# Patient Record
Sex: Male | Born: 1945 | Race: Black or African American | Hispanic: No | Marital: Married | Smoking: Former smoker
Health system: Southern US, Community
[De-identification: ages and names within clinical notes are randomized; demographics above are authoritative.]

## PROBLEM LIST (undated history)

## (undated) DIAGNOSIS — I10 Essential (primary) hypertension: Secondary | ICD-10-CM

## (undated) DIAGNOSIS — K219 Gastro-esophageal reflux disease without esophagitis: Secondary | ICD-10-CM

## (undated) DIAGNOSIS — C801 Malignant (primary) neoplasm, unspecified: Secondary | ICD-10-CM

## (undated) HISTORY — PX: BLADDER SURGERY: SHX569

---

## 2015-06-12 DIAGNOSIS — Z131 Encounter for screening for diabetes mellitus: Secondary | ICD-10-CM | POA: Diagnosis not present

## 2015-06-12 DIAGNOSIS — Z125 Encounter for screening for malignant neoplasm of prostate: Secondary | ICD-10-CM | POA: Diagnosis not present

## 2015-06-12 DIAGNOSIS — Z0000 Encounter for general adult medical examination without abnormal findings: Secondary | ICD-10-CM | POA: Diagnosis not present

## 2015-06-12 DIAGNOSIS — Z13220 Encounter for screening for lipoid disorders: Secondary | ICD-10-CM | POA: Diagnosis not present

## 2015-06-12 DIAGNOSIS — K219 Gastro-esophageal reflux disease without esophagitis: Secondary | ICD-10-CM | POA: Diagnosis not present

## 2015-06-12 DIAGNOSIS — I10 Essential (primary) hypertension: Secondary | ICD-10-CM | POA: Diagnosis not present

## 2015-06-13 DIAGNOSIS — H2513 Age-related nuclear cataract, bilateral: Secondary | ICD-10-CM | POA: Diagnosis not present

## 2015-06-13 DIAGNOSIS — H40033 Anatomical narrow angle, bilateral: Secondary | ICD-10-CM | POA: Diagnosis not present

## 2015-07-11 DIAGNOSIS — H6123 Impacted cerumen, bilateral: Secondary | ICD-10-CM | POA: Diagnosis not present

## 2015-07-11 DIAGNOSIS — H903 Sensorineural hearing loss, bilateral: Secondary | ICD-10-CM | POA: Diagnosis not present

## 2015-07-24 DIAGNOSIS — I10 Essential (primary) hypertension: Secondary | ICD-10-CM | POA: Diagnosis not present

## 2015-07-24 DIAGNOSIS — K219 Gastro-esophageal reflux disease without esophagitis: Secondary | ICD-10-CM | POA: Diagnosis not present

## 2015-10-23 DIAGNOSIS — Z2821 Immunization not carried out because of patient refusal: Secondary | ICD-10-CM | POA: Diagnosis not present

## 2015-10-23 DIAGNOSIS — I10 Essential (primary) hypertension: Secondary | ICD-10-CM | POA: Diagnosis not present

## 2017-02-05 ENCOUNTER — Emergency Department (HOSPITAL_COMMUNITY)
Admission: EM | Admit: 2017-02-05 | Discharge: 2017-02-06 | Disposition: A | Discharge status: Home / Self Care | Payer: Medicare Other | Attending: Emergency Medicine | Admitting: Emergency Medicine

## 2017-02-05 ENCOUNTER — Encounter (HOSPITAL_COMMUNITY): Payer: Self-pay | Admitting: Emergency Medicine

## 2017-02-05 DIAGNOSIS — N3001 Acute cystitis with hematuria: Secondary | ICD-10-CM | POA: Diagnosis not present

## 2017-02-05 DIAGNOSIS — R319 Hematuria, unspecified: Secondary | ICD-10-CM | POA: Diagnosis present

## 2017-02-05 DIAGNOSIS — N329 Bladder disorder, unspecified: Secondary | ICD-10-CM

## 2017-02-05 DIAGNOSIS — I10 Essential (primary) hypertension: Secondary | ICD-10-CM | POA: Diagnosis not present

## 2017-02-05 DIAGNOSIS — Z87891 Personal history of nicotine dependence: Secondary | ICD-10-CM | POA: Diagnosis not present

## 2017-02-05 HISTORY — DX: Gastro-esophageal reflux disease without esophagitis: K21.9

## 2017-02-05 HISTORY — DX: Essential (primary) hypertension: I10

## 2017-02-05 LAB — URINALYSIS, ROUTINE W REFLEX MICROSCOPIC
Bilirubin Urine: NEGATIVE
Glucose, UA: NEGATIVE mg/dL
Ketones, ur: NEGATIVE mg/dL
Nitrite: NEGATIVE
Protein, ur: NEGATIVE mg/dL
Specific Gravity, Urine: 1.004 — ABNORMAL LOW (ref 1.005–1.030)
Squamous Epithelial / LPF: NONE SEEN
pH: 6 (ref 5.0–8.0)

## 2017-02-05 NOTE — ED Triage Notes (Signed)
Pt comes in with complaints of hematuria since last week.  Denies any pain with urination.  "slight ache" around right flank.  Ambulatory.  A&O x4.  No other complaints at this time.

## 2017-02-05 NOTE — ED Notes (Signed)
MADE TWO UNSUCCESSFUL ATTEMPTS TO COLLECT BLOOD SAMPLES 

## 2017-02-05 NOTE — ED Provider Notes (Signed)
[facility] DEPT Provider Note   CSN: [id number] Arrival date & time: 02/05/17  1910   By signing my name below, I, Delton Prairie, attest that this documentation has been prepared under the direction and in the presence of Catalyna Reilly, MD  Electronically Signed: Delton Prairie, ED Scribe. 02/05/17. 12:00 AM.   History   Chief Complaint Chief Complaint  Patient presents with  . Hematuria  . Flank Pain   The history is provided by the patient. No language interpreter was used.  Hematuria  This is a new problem. The current episode started more than 2 days ago. The problem occurs every several days. The problem has not changed since onset.Pertinent negatives include no abdominal pain and no headaches. Nothing aggravates the symptoms. Nothing relieves the symptoms. He has tried nothing for the symptoms. The treatment provided no relief.   HPI Comments:  Jeremy Patrick is a 71 y.o. male, with a hx of GERD and HTN on 40 mg lisinopril, who presents to the Emergency Department complaining of acute onset, persistent hematuria onset 1 week. He also reports frequency, constipation and resolved right sided lower back pain. No alleviating factors noted. Pt denies fevers, dysuria, nausea, vomiting or any other associated symptoms.   Past Medical History:  Diagnosis Date  . GERD (gastroesophageal reflux disease)   . Hypertension     There are no active problems to display for this patient.   History reviewed. No pertinent surgical history.   Home Medications    Prior to Admission medications   Not on File    Family History No family history on file.  Social History Social History  Substance Use Topics  . Smoking status: Former Research scientist (life sciences)  . Smokeless tobacco: Never Used  . Alcohol use Yes     Allergies   Patient has no known allergies.   Review of Systems Review of Systems  Constitutional: Negative for fever.  Gastrointestinal: Positive for constipation. Negative for  abdominal pain, nausea and vomiting.  Genitourinary: Positive for frequency and hematuria. Negative for dysuria and flank pain.  Musculoskeletal: Positive for myalgias.  Neurological: Negative for headaches.  All other systems reviewed and are negative.  Physical Exam Updated Vital Signs BP (!) 151/114 (BP Location: Left Arm)   Pulse 64   Temp 98.4 F (36.9 C) (Oral)   Resp 18   Ht 6' (1.829 m)   Wt 210 lb (95.3 kg)   SpO2 99%   BMI 28.48 kg/m   Physical Exam  Constitutional: He is oriented to person, place, and time. He appears well-developed and well-nourished. No distress.  HENT:  Head: Normocephalic and atraumatic.  Mouth/Throat: Oropharynx is clear and moist. No oropharyngeal exudate.  Moist mucous membranes   Eyes: Conjunctivae are normal. Pupils are equal, round, and reactive to light.  Neck: Normal range of motion. Neck supple. No JVD present.  Trachea midline No bruit  Cardiovascular: Normal rate, regular rhythm and normal heart sounds.   Pulmonary/Chest: Effort normal and breath sounds normal. No stridor. No respiratory distress.  Abdominal: Soft. Bowel sounds are normal. He exhibits no distension and no mass. There is no tenderness. There is no rebound and no guarding.  Musculoskeletal: Normal range of motion.  Neurological: He is alert and oriented to person, place, and time. He has normal reflexes. He displays normal reflexes.  Skin: Skin is warm and dry. Capillary refill takes less than 2 seconds.  Psychiatric: He has a normal mood and affect. His behavior is normal.  Nursing note and  vitals reviewed.  ED Treatments / Results   Vitals:   02/05/17 1941 02/05/17 2159  BP: 168/86 (!) 151/114  Pulse: 64 64  Resp: 18 18  Temp: 98.4 F (36.9 C)    Results for orders placed or performed during the hospital encounter of 02/05/17  Urinalysis, Routine w reflex microscopic- may I&O cath if menses  Result Value Ref Range   Color, Urine STRAW (A) YELLOW    APPearance CLEAR CLEAR   Specific Gravity, Urine 1.004 (L) 1.005 - 1.030   pH 6.0 5.0 - 8.0   Glucose, UA NEGATIVE NEGATIVE mg/dL   Hgb urine dipstick LARGE (A) NEGATIVE   Bilirubin Urine NEGATIVE NEGATIVE   Ketones, ur NEGATIVE NEGATIVE mg/dL   Protein, ur NEGATIVE NEGATIVE mg/dL   Nitrite NEGATIVE NEGATIVE   Leukocytes, UA MODERATE (A) NEGATIVE   RBC / HPF TOO NUMEROUS TO COUNT 0 - 5 RBC/hpf   WBC, UA TOO NUMEROUS TO COUNT 0 - 5 WBC/hpf   Bacteria, UA RARE (A) NONE SEEN   Squamous Epithelial / LPF NONE SEEN NONE SEEN  CBC  Result Value Ref Range   WBC 7.5 4.0 - 10.5 K/uL   RBC 4.78 4.22 - 5.81 MIL/uL   Hemoglobin 13.6 13.0 - 17.0 g/dL   HCT 40.4 39.0 - 52.0 %   MCV 84.5 78.0 - 100.0 fL   MCH 28.5 26.0 - 34.0 pg   MCHC 33.7 30.0 - 36.0 g/dL   RDW 14.2 11.5 - 15.5 %   Platelets 225 150 - 400 K/uL  Basic metabolic panel  Result Value Ref Range   Sodium 143 135 - 145 mmol/L   Potassium 3.5 3.5 - 5.1 mmol/L   Chloride 109 101 - 111 mmol/L   CO2 26 22 - 32 mmol/L   Glucose, Bld 96 65 - 99 mg/dL   BUN 12 6 - 20 mg/dL   Creatinine, Ser 1.32 (H) 0.61 - 1.24 mg/dL   Calcium 9.3 8.9 - 10.3 mg/dL   GFR calc non Af Amer 53 (L) >60 mL/min   GFR calc Af Amer >60 >60 mL/min   Anion gap 8 5 - 15   Ct Renal Stone Study  Result Date: 02/06/2017 CLINICAL DATA:  Acute onset of hematuria pain and right flank ache. Initial encounter. EXAM: CT ABDOMEN AND PELVIS WITHOUT CONTRAST TECHNIQUE: Multidetector CT imaging of the abdomen and pelvis was performed following the standard protocol without IV contrast. COMPARISON:  None. FINDINGS: Lower chest: The visualized lung bases are grossly clear. The visualized portions of the mediastinum are unremarkable. Hepatobiliary: Hypodensities within the liver measure up to 1.5 cm in size. The liver is otherwise unremarkable. Stones are seen within the gallbladder. The common bile duct remains normal in caliber. Pancreas: The pancreas is within normal limits.  Spleen: The spleen is unremarkable in appearance. Adrenals/Urinary Tract: The adrenal glands are unremarkable in appearance. The kidneys are within normal limits. There is no evidence of hydronephrosis. No renal or ureteral stones are identified. No perinephric stranding is seen. Stomach/Bowel: The stomach is unremarkable in appearance. The small bowel is within normal limits. The appendix is normal in caliber, without evidence of appendicitis. Scattered diverticulosis is noted along the ascending and descending colon, without evidence of diverticulitis. Vascular/Lymphatic: Minimal calcification is noted about the aortic bifurcation. The aorta is otherwise unremarkable. The inferior vena cava is grossly unremarkable. No retroperitoneal lymphadenopathy is seen. No pelvic sidewall lymphadenopathy is identified. Reproductive: A focal 2.5 cm nodule is noted at the left base  of the bladder. This is suspicious for malignancy, given hematuria, though fungal infection might have a similar appearance. Mild haziness about the bladder may reflect cystitis. The prostate remains normal in size, with minimal calcification. Other: No additional soft tissue abnormalities are seen. Musculoskeletal: No acute osseous abnormalities are identified. The visualized musculature is unremarkable in appearance. IMPRESSION: 1. Focal 2.5 cm nodule at the left base of the bladder. This is suspicious for malignancy, given the patient's hematuria, though fungal infection might have a similar appearance. Cystoscopy is recommended for further evaluation. 2. Mild haziness about the the bladder may reflect cystitis. 3. Nonspecific hypodensities within the liver measure up to 1.5 cm in size. Would correlate with LFTs. 4. Cholelithiasis.  Gallbladder otherwise unremarkable. 5. Scattered diverticulosis along the ascending and descending colon, without evidence of diverticulitis. Electronically Signed   By: Garald Balding M.D.   On: 02/06/2017 00:31     DIAGNOSTIC STUDIES: Oxygen Saturation is 99% on RA, normal by my interpretation.    COORDINATION OF CARE: 11:59 PM Discussed treatment plan with pt at bedside and pt agreed to plan.   Procedures Procedures (including critical care time)  Medications Ordered in ED  Medications  tamsulosin (FLOMAX) capsule 0.4 mg (0.4 mg Oral Given 02/06/17 0042)  cefTRIAXone (ROCEPHIN) injection 1 g (1 g Intramuscular Given 02/06/17 0042)  lidocaine (XYLOCAINE) 1 % (with pres) injection (2.1 mLs  Given 02/06/17 0042)      This is a 71 y.o. -year-old male presents with hematuria.  The patient is nontoxic-appearing on exam and vital signs are within normal limits. He will be treated for acute cystitis for both baterial and fungal infection as this was also questioned on CT scan.  He has a lesion in his bladder and will be referred to Dr. Raynelle Bring, urology for cystoscopy to determine the etiology of this lesion.  Patient and wife informed that while there is infection this lesion could potentionally represent malignancy so it is imperative that they follow up ASAP.  Both verbalize understanding and agree to follow up.    Patient was given return precautions and was advised to return immediately for fevers, weakness, vomiting or if current symptoms worsen. After history, exam, and medical workup I feel the patient has been appropriately medically screened and is safe for discharge home. Pertinent diagnoses were discussed with the patient.   I personally performed the services described in this documentation, which was scribed in my presence. The recorded information has been reviewed and is accurate.       Veatrice Kells, MD 02/06/17 [phone]

## 2017-02-06 ENCOUNTER — Encounter (HOSPITAL_COMMUNITY): Payer: Self-pay | Admitting: Emergency Medicine

## 2017-02-06 ENCOUNTER — Emergency Department (HOSPITAL_COMMUNITY): Payer: Medicare Other

## 2017-02-06 DIAGNOSIS — N3001 Acute cystitis with hematuria: Secondary | ICD-10-CM | POA: Diagnosis not present

## 2017-02-06 LAB — CBC
HCT: 40.4 % (ref 39.0–52.0)
Hemoglobin: 13.6 g/dL (ref 13.0–17.0)
MCH: 28.5 pg (ref 26.0–34.0)
MCHC: 33.7 g/dL (ref 30.0–36.0)
MCV: 84.5 fL (ref 78.0–100.0)
Platelets: 225 10*3/uL (ref 150–400)
RBC: 4.78 MIL/uL (ref 4.22–5.81)
RDW: 14.2 % (ref 11.5–15.5)
WBC: 7.5 10*3/uL (ref 4.0–10.5)

## 2017-02-06 LAB — BASIC METABOLIC PANEL
Anion gap: 8 (ref 5–15)
BUN: 12 mg/dL (ref 6–20)
CO2: 26 mmol/L (ref 22–32)
Calcium: 9.3 mg/dL (ref 8.9–10.3)
Chloride: 109 mmol/L (ref 101–111)
Creatinine, Ser: 1.32 mg/dL — ABNORMAL HIGH (ref 0.61–1.24)
GFR calc Af Amer: >60 mL/min (ref >60)
GFR calc non Af Amer: 53 mL/min — ABNORMAL LOW (ref >60)
Glucose, Bld: 96 mg/dL (ref 65–99)
Potassium: 3.5 mmol/L (ref 3.5–5.1)
Sodium: 143 mmol/L (ref 135–145)

## 2017-02-06 MED ORDER — TAMSULOSIN HCL 0.4 MG PO CAPS: 0.4000 mg | ORAL_CAPSULE | Freq: Every day | ORAL | 0 refills | Status: DC

## 2017-02-06 MED ORDER — FLUCONAZOLE 200 MG PO TABS: 200.0000 mg | ORAL_TABLET | Freq: Every day | ORAL | 0 refills | Status: DC

## 2017-02-06 MED ORDER — LIDOCAINE HCL 1 % IJ SOLN
INTRAMUSCULAR | Status: AC
  Administered 2017-02-06: 2.1 mL
  Filled 2017-02-06: qty 20

## 2017-02-06 MED ORDER — CEFTRIAXONE SODIUM 1 G IJ SOLR
1.0000 g | Freq: Once | INTRAMUSCULAR | Status: AC
  Administered 2017-02-06: 1 g via INTRAMUSCULAR
  Filled 2017-02-06: qty 10

## 2017-02-06 MED ORDER — TAMSULOSIN HCL 0.4 MG PO CAPS
0.4000 mg | ORAL_CAPSULE | Freq: Every day | ORAL | Status: DC
  Administered 2017-02-06: 0.4 mg via ORAL
  Filled 2017-02-06: qty 1

## 2017-02-06 MED ORDER — CEPHALEXIN 500 MG PO CAPS: 500.0000 mg | ORAL_CAPSULE | Freq: Four times a day (QID) | ORAL | 0 refills | Status: DC

## 2017-02-07 LAB — URINE CULTURE: Culture: <10000 — AB

## 2017-02-13 ENCOUNTER — Other Ambulatory Visit: Payer: Self-pay | Admitting: Urology

## 2017-02-26 NOTE — Patient Instructions (Addendum)
BLAKELEY MARGRAF  02/26/2017   Your procedure is scheduled on: 03-02-17  Report to [facility] Main  Entrance take [facility]  elevators to 3rd floor to  [place] at 3:30PM.  Call this number if you have problems the morning of surgery [phone]   Remember: ONLY 1 PERSON MAY GO WITH YOU TO SHORT STAY TO GET  READY MORNING OF [place].  Do not eat food After Midnight. You may have clear liquids from midnight until 930am day of surgery. Nothing by mouth after 930am!!     Take these medicines the morning of surgery with A SIP OF WATER: diltiazem(cardizem), ranitidine(zantac)                                 You may not have any metal on your body including hair pins and              piercings  Do not wear jewelry, make-up, lotions, powders or perfumes, deodorant             Do not wear nail polish.  Do not shave  48 hours prior to surgery.              Men may shave face and neck.   Do not bring valuables to the hospital. [place].  Contacts, dentures or bridgework may not be worn into surgery.       Patients discharged the day of surgery will not be allowed to drive home.  Name and phone number of your driver:  Special Instructions: N/A              Please read over the following fact sheets you were given: _____________________________________________________________________      CLEAR LIQUID DIET   Foods Allowed                                                                     Foods Excluded  Coffee and tea, regular and decaf                             liquids that you cannot  Plain Jell-O in any flavor                                             see through such as: Fruit ices (not with fruit pulp)                                     milk, soups, orange juice  Iced Popsicles                                    All solid food Carbonated  beverages, regular and diet                                     Cranberry, grape and apple juices Sports drinks like Gatorade Lightly seasoned clear broth or consume(fat free) Sugar, honey syrup  Sample Menu Breakfast                                Lunch                                     Supper Cranberry juice                    Beef broth                            Chicken broth Jell-O                                     Grape juice                           Apple juice Coffee or tea                        Jell-O                                      Popsicle                                                Coffee or tea                        Coffee or tea  _____________________________________________________________________            [facility] - Preparing for Surgery Before surgery, you can play an important role.  Because skin is not sterile, your skin needs to be as free of germs as possible.  You can reduce the number of germs on your skin by washing with CHG (chlorahexidine gluconate) soap before surgery.  CHG is an antiseptic cleaner which kills germs and bonds with the skin to continue killing germs even after washing. Please DO NOT use if you have an allergy to CHG or antibacterial soaps.  If your skin becomes reddened/irritated stop using the CHG and inform your nurse when you arrive at Short Stay. Do not shave (including legs and underarms) for at least 48 hours prior to the first CHG shower.  You may shave your face/neck. Please follow these instructions carefully:  1.  Shower with CHG Soap the night before surgery and the  morning of Surgery.  2.  If you choose to wash your hair, wash your hair first as usual with your  normal  shampoo.  3.  After you shampoo, rinse your hair and body thoroughly to remove the  shampoo.  4.  Use CHG as you would any other liquid soap.  You can apply chg directly  to the skin and wash                       Gently with a scrungie or clean washcloth.  5.  Apply the CHG Soap  to your body ONLY FROM THE NECK DOWN.   Do not use on face/ open                           Wound or open sores. Avoid contact with eyes, ears mouth and genitals (private parts).                       Wash face,  Genitals (private parts) with your normal soap.             6.  Wash thoroughly, paying special attention to the area where your surgery  will be performed.  7.  Thoroughly rinse your body with warm water from the neck down.  8.  DO NOT shower/wash with your normal soap after using and rinsing off  the CHG Soap.                9.  Pat yourself dry with a clean towel.            10.  Wear clean pajamas.            11.  Place clean sheets on your bed the night of your first shower and do not  sleep with pets. Day of Surgery : Do not apply any lotions/deodorants the morning of surgery.  Please wear clean clothes to the hospital/surgery center.  FAILURE TO FOLLOW THESE INSTRUCTIONS MAY RESULT IN THE CANCELLATION OF YOUR SURGERY PATIENT SIGNATURE_________________________________  NURSE SIGNATURE__________________________________  ________________________________________________________________________

## 2017-02-26 NOTE — Progress Notes (Signed)
CBC, BMP 02-06-17 epic

## 2017-02-27 ENCOUNTER — Ambulatory Visit (HOSPITAL_COMMUNITY)
Admission: RE | Admit: 2017-02-27 | Discharge: 2017-02-27 | Disposition: A | Discharge status: Home / Self Care | Payer: Medicare Other | Source: Ambulatory Visit | Attending: Urology | Admitting: Urology

## 2017-02-27 ENCOUNTER — Encounter (HOSPITAL_COMMUNITY)
Admission: RE | Admit: 2017-02-27 | Discharge: 2017-02-27 | Disposition: A | Discharge status: Home / Self Care | Payer: Medicare Other | Source: Ambulatory Visit | Attending: Urology | Admitting: Urology

## 2017-02-27 ENCOUNTER — Encounter (HOSPITAL_COMMUNITY): Payer: Self-pay

## 2017-02-27 DIAGNOSIS — R001 Bradycardia, unspecified: Secondary | ICD-10-CM | POA: Diagnosis not present

## 2017-02-27 DIAGNOSIS — I10 Essential (primary) hypertension: Secondary | ICD-10-CM | POA: Insufficient documentation

## 2017-02-27 DIAGNOSIS — R938 Abnormal findings on diagnostic imaging of other specified body structures: Secondary | ICD-10-CM | POA: Insufficient documentation

## 2017-02-27 DIAGNOSIS — Z01810 Encounter for preprocedural cardiovascular examination: Secondary | ICD-10-CM | POA: Diagnosis not present

## 2017-02-27 DIAGNOSIS — R9389 Abnormal findings on diagnostic imaging of other specified body structures: Secondary | ICD-10-CM

## 2017-02-27 NOTE — Progress Notes (Signed)
CXR results routed to Dr Alinda Money via epic

## 2017-02-27 NOTE — H&P (Signed)
Office Visit Report     02/11/2017   --------------------------------------------------------------------------------   Jeremy Patrick  MRN: [id number]  PRIMARY CARE:  Nolene Ebbs, MD  DOB: 11-Aug-1946, 71 year old Male  REFERRING:    SSN: [id number]  PROVIDER:  Raynelle Bring, M.D.    LOCATION:  [facility] [phone]   --------------------------------------------------------------------------------   CC/HPI: Gross hematuria/bladder mass   Jeremy Patrick is a 71 year old gentleman who was recently evaluated in the emergency department on 02/06/17 after developing painless gross hematuria. He does have a history distant tobacco use and smoked 1 pack per week for approximately 10 years prior to quitting about 40 years ago. He has no family history of GU malignancy. In the emergency department, he underwent a CT stone study without contrast that did demonstrate a possible bladder tumor. No hydronephrosis or urolithiasis was noted.     ALLERGIES: None   MEDICATIONS: Aspirin  Lisinopril  Omeprazole     GU PSH: None   NON-GU PSH: None   GU PMH: None   NON-GU PMH: GERD    FAMILY HISTORY: 3 Son's - Runs in Family    Notes: 2 daughters   SOCIAL HISTORY: Marital Status: Unknown Current Smoking Status: Patient has never smoked.   Tobacco Use Assessment Completed: Used Tobacco in last 30 days? Does not drink anymore.  Drinks 1 caffeinated drink per day.    REVIEW OF SYSTEMS:    GU Review Male:   Patient reports frequent urination and get up at night to urinate. Patient denies hard to postpone urination, burning/ pain with urination, leakage of urine, stream starts and stops, trouble starting your streams, and have to strain to urinate .  Gastrointestinal (Lower):   Patient denies diarrhea and constipation.  Gastrointestinal (Upper):   Patient denies nausea and vomiting.  Constitutional:   Patient denies fever, night sweats, weight loss, and fatigue.  Skin:    Patient denies itching and skin rash/ lesion.  Eyes:   Patient denies blurred vision and double vision.  Ears/ Nose/ Throat:   Patient denies sore throat and sinus problems.  Hematologic/Lymphatic:   Patient denies swollen glands and easy bruising.  Cardiovascular:   Patient denies leg swelling and chest pains.  Respiratory:   Patient denies cough and shortness of breath.  Endocrine:   Patient denies excessive thirst.  Musculoskeletal:   Patient denies back pain and joint pain.  Neurological:   Patient denies headaches and dizziness.  Psychologic:   Patient denies depression and anxiety.   VITAL SIGNS:      02/11/2017 01:04 PM  Weight 210 lb / 95.25 kg  Height 72 in / 182.88 cm  BP 161/96 mmHg  Pulse 59 /min  BMI 28.5 kg/m   MULTI-SYSTEM PHYSICAL EXAMINATION:    Constitutional: Well-nourished. No physical deformities. Normally developed. Good grooming.  Neck: Neck symmetrical, not swollen. Normal tracheal position.  Respiratory: No labored breathing, no use of accessory muscles. Clear bilaterally.  Cardiovascular: Normal temperature, normal extremity pulses, no swelling, no varicosities. Regular rate and rhythm.  Lymphatic: No enlargement of neck, axillae, groin.  Skin: No paleness, no jaundice, no cyanosis. No lesion, no ulcer, no rash.  Neurologic / Psychiatric: Oriented to time, oriented to place, oriented to person. No depression, no anxiety, no agitation.  Gastrointestinal: No mass, no tenderness, no rigidity, non obese abdomen.  Eyes: Normal conjunctivae. Normal eyelids.  Ears, Nose, Mouth, and Throat: Left ear no scars, no lesions, no masses. Right ear no scars, no lesions,  no masses. Nose no scars, no lesions, no masses. Normal hearing. Normal lips.  Musculoskeletal: Normal gait and station of head and neck.     PAST DATA REVIEWED:  Source Of History:  Patient  Records Review:   Previous Patient Records  Urine Test Review:   Urinalysis  X-Ray Review: C.T. Stone Protocol:  Reviewed Films. Findings are as dictated above.    PROCEDURES:         Flexible Cystoscopy - 52000  Indication: Hematuria/bladder mass Risks, benefits, and potential complications of the procedure were discussed with the patient including infection, bleeding, voiding discomfort, urinary retention, fever, chills, sepsis, and others. All questions were answered. Informed consent was obtained. Sterile technique and intraurethral analgesia were used.  Meatus:  Normal size. Normal location. Normal condition.  Urethra:  No strictures.  External Sphincter:  Normal.  Verumontanum:  Normal.  Prostate:  Non-obstructing. No hyperplasia.  Bladder Neck:  Non-obstructing.  Ureteral Orifices:  Normal location. Normal size. Normal shape. Effluxed clear urine.  Bladder:  Systematic examination of the bladder was performed. This revealed a 3 cm bladder tumor off the left side of the bladder neck protruding across the bladder neck. This did appear somewhat concerning for high-grade disease. There were no other bladder tumors or other mucosal pathology noted. A bladder washing for cytology was obtained.      Chaperone: AJ The procedure was well-tolerated and without complications. Instructions were given to call the office immediately if questions or problems.         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 Hematuria With and Without I.V. Contrast  Return Visit/Planned Activity: Other See Visit Notes             Note: Will call to schedule surgery          Document Letter(s):  Created for Patient: Clinical Summary         Notes:   1. Gross hematuria: He will complete his hematuria evaluation with a CT hematuria protocol scan. He does have an obvious bladder tumor that is the most likely etiology for his hematuria.   2. Bladder tumor: We reviewed the fact that this likely represents a bladder malignancy. I recommended that he be scheduled for cystoscopy and transurethral resection of his bladder tumor with pelvic exam under anesthesia. We reviewed the potential risks of this procedure including but not limited to bleeding, infection, cardiopulmonary risks of anesthesia, need for further procedures, and the risk of bladder perforation, etc. I recommended that he have postoperative installation of mitomycin C and we discussed the  potential risks and benefits of postoperative chemotherapy as well. He gives his informed consent to proceed.   Cc:    * Signed by Raynelle Bring, M.D. on 02/11/17 at 5:57 PM (EST)*

## 2017-03-02 ENCOUNTER — Encounter (HOSPITAL_COMMUNITY): Payer: Self-pay | Admitting: *Deleted

## 2017-03-02 ENCOUNTER — Ambulatory Visit (HOSPITAL_COMMUNITY): Payer: Medicare Other | Admitting: Anesthesiology

## 2017-03-02 ENCOUNTER — Encounter (HOSPITAL_COMMUNITY)
Admission: RE | Disposition: A | Discharge status: Home / Self Care | Payer: Self-pay | Source: Ambulatory Visit | Attending: Urology

## 2017-03-02 ENCOUNTER — Ambulatory Visit (HOSPITAL_COMMUNITY)
Admission: RE | Admit: 2017-03-02 | Discharge: 2017-03-02 | Disposition: A | Discharge status: Home / Self Care | Payer: Medicare Other | Source: Ambulatory Visit | Attending: Urology | Admitting: Urology

## 2017-03-02 DIAGNOSIS — R310 Gross hematuria: Secondary | ICD-10-CM | POA: Diagnosis not present

## 2017-03-02 DIAGNOSIS — I10 Essential (primary) hypertension: Secondary | ICD-10-CM | POA: Diagnosis not present

## 2017-03-02 DIAGNOSIS — K219 Gastro-esophageal reflux disease without esophagitis: Secondary | ICD-10-CM | POA: Insufficient documentation

## 2017-03-02 DIAGNOSIS — C679 Malignant neoplasm of bladder, unspecified: Secondary | ICD-10-CM | POA: Insufficient documentation

## 2017-03-02 DIAGNOSIS — Z7982 Long term (current) use of aspirin: Secondary | ICD-10-CM | POA: Diagnosis not present

## 2017-03-02 DIAGNOSIS — D494 Neoplasm of unspecified behavior of bladder: Secondary | ICD-10-CM | POA: Diagnosis present

## 2017-03-02 HISTORY — PX: TRANSURETHRAL RESECTION OF BLADDER TUMOR WITH MITOMYCIN-C: SHX6459

## 2017-03-02 LAB — BASIC METABOLIC PANEL
Anion gap: 8 (ref 5–15)
BUN: 13 mg/dL (ref 6–20)
CO2: 28 mmol/L (ref 22–32)
Calcium: 9.2 mg/dL (ref 8.9–10.3)
Chloride: 102 mmol/L (ref 101–111)
Creatinine, Ser: 1.39 mg/dL — ABNORMAL HIGH (ref 0.61–1.24)
GFR calc Af Amer: 58 mL/min — ABNORMAL LOW (ref >60)
GFR calc non Af Amer: 50 mL/min — ABNORMAL LOW (ref >60)
Glucose, Bld: 86 mg/dL (ref 65–99)
Potassium: 3.7 mmol/L (ref 3.5–5.1)
Sodium: 138 mmol/L (ref 135–145)

## 2017-03-02 SURGERY — TRANSURETHRAL RESECTION OF BLADDER TUMOR WITH MITOMYCIN-C
Anesthesia: General

## 2017-03-02 MED ORDER — LACTATED RINGERS IV SOLN
INTRAVENOUS | Status: DC
  Administered 2017-03-02: 1000 mL via INTRAVENOUS

## 2017-03-02 MED ORDER — CEFAZOLIN SODIUM-DEXTROSE 2-4 GM/100ML-% IV SOLN
2.0000 g | INTRAVENOUS | Status: AC
  Administered 2017-03-02: 2 g via INTRAVENOUS
  Filled 2017-03-02: qty 100

## 2017-03-02 MED ORDER — PROMETHAZINE HCL 25 MG/ML IJ SOLN: 6.2500 mg | INTRAMUSCULAR | Status: DC | PRN

## 2017-03-02 MED ORDER — ONDANSETRON HCL 4 MG/2ML IJ SOLN
INTRAMUSCULAR | Status: DC | PRN
  Administered 2017-03-02: 4 mg via INTRAVENOUS

## 2017-03-02 MED ORDER — HYDROCODONE-ACETAMINOPHEN 5-325 MG PO TABS
1.0000 | ORAL_TABLET | Freq: Once | ORAL | Status: AC | PRN
  Administered 2017-03-02: 1 via ORAL
  Filled 2017-03-02: qty 1

## 2017-03-02 MED ORDER — PHENAZOPYRIDINE HCL 100 MG PO TABS
100.0000 mg | ORAL_TABLET | Freq: Once | ORAL | Status: DC | PRN
  Filled 2017-03-02: qty 1

## 2017-03-02 MED ORDER — SUGAMMADEX SODIUM 200 MG/2ML IV SOLN
INTRAVENOUS | Status: DC | PRN
Start: 2017-03-02 — End: 2017-03-02
  Administered 2017-03-02: 200 mg via INTRAVENOUS

## 2017-03-02 MED ORDER — LIDOCAINE 2% (20 MG/ML) 5 ML SYRINGE
INTRAMUSCULAR | Status: AC
  Filled 2017-03-02: qty 5

## 2017-03-02 MED ORDER — ROCURONIUM BROMIDE 10 MG/ML (PF) SYRINGE
PREFILLED_SYRINGE | INTRAVENOUS | Status: DC | PRN
  Administered 2017-03-02: 40 mg via INTRAVENOUS

## 2017-03-02 MED ORDER — FENTANYL CITRATE (PF) 100 MCG/2ML IJ SOLN
INTRAMUSCULAR | Status: AC
  Filled 2017-03-02: qty 2

## 2017-03-02 MED ORDER — PHENAZOPYRIDINE HCL 100 MG PO TABS: 100.0000 mg | ORAL_TABLET | Freq: Three times a day (TID) | ORAL | 0 refills | Status: DC | PRN

## 2017-03-02 MED ORDER — SODIUM CHLORIDE 0.9 % IR SOLN
Status: DC | PRN
  Administered 2017-03-02: 12000 mL via INTRAVESICAL

## 2017-03-02 MED ORDER — MEPERIDINE HCL 50 MG/ML IJ SOLN: 6.2500 mg | INTRAMUSCULAR | Status: DC | PRN

## 2017-03-02 MED ORDER — HYDROCODONE-ACETAMINOPHEN 5-325 MG PO TABS: 1.0000 | ORAL_TABLET | Freq: Four times a day (QID) | ORAL | 0 refills | Status: DC | PRN

## 2017-03-02 MED ORDER — HYDROMORPHONE HCL 1 MG/ML IJ SOLN
0.2500 mg | INTRAMUSCULAR | Status: DC | PRN
Start: 2017-03-02 — End: 2017-03-02

## 2017-03-02 MED ORDER — PROPOFOL 10 MG/ML IV BOLUS
INTRAVENOUS | Status: DC | PRN
  Administered 2017-03-02: 200 mg via INTRAVENOUS

## 2017-03-02 MED ORDER — FENTANYL CITRATE (PF) 100 MCG/2ML IJ SOLN
INTRAMUSCULAR | Status: DC | PRN
  Administered 2017-03-02 (×2): 50 ug via INTRAVENOUS
  Administered 2017-03-02: 100 ug via INTRAVENOUS

## 2017-03-02 MED ORDER — LIDOCAINE 2% (20 MG/ML) 5 ML SYRINGE
INTRAMUSCULAR | Status: DC | PRN
  Administered 2017-03-02: 100 mg via INTRAVENOUS

## 2017-03-02 MED ORDER — SUCCINYLCHOLINE CHLORIDE 200 MG/10ML IV SOSY
PREFILLED_SYRINGE | INTRAVENOUS | Status: DC | PRN
  Administered 2017-03-02: 160 mg via INTRAVENOUS

## 2017-03-02 MED ORDER — ONDANSETRON HCL 4 MG/2ML IJ SOLN
INTRAMUSCULAR | Status: AC
  Filled 2017-03-02: qty 2

## 2017-03-02 MED ORDER — SUGAMMADEX SODIUM 200 MG/2ML IV SOLN
INTRAVENOUS | Status: AC
  Filled 2017-03-02: qty 2

## 2017-03-02 MED ORDER — ROCURONIUM BROMIDE 50 MG/5ML IV SOSY
PREFILLED_SYRINGE | INTRAVENOUS | Status: AC
  Filled 2017-03-02: qty 5

## 2017-03-02 SURGICAL SUPPLY — 11 items
BAG URO CATCHER STRL LF (MISCELLANEOUS) ×3 IMPLANT
CATH INTERMIT  6FR 70CM (CATHETERS) ×3 IMPLANT
CLOTH BEACON ORANGE TIMEOUT ST (SAFETY) ×3 IMPLANT
GLOVE BIOGEL M STRL SZ7.5 (GLOVE) ×3 IMPLANT
GOWN STRL REUS W/TWL LRG LVL3 (GOWN DISPOSABLE) ×6 IMPLANT
GUIDEWIRE STR DUAL SENSOR (WIRE) ×3 IMPLANT
LOOP CUT BIPOLAR 24F LRG (ELECTROSURGICAL) ×3 IMPLANT
MANIFOLD NEPTUNE II (INSTRUMENTS) ×3 IMPLANT
NS IRRIG 1000ML POUR BTL (IV SOLUTION) ×3 IMPLANT
PACK CYSTO (CUSTOM PROCEDURE TRAY) ×3 IMPLANT
TUBING CONNECTING 10 (TUBING) ×3 IMPLANT

## 2017-03-02 NOTE — Anesthesia Procedure Notes (Addendum)
Procedure Name: Intubation Date/Time: 03/02/2017 4:29 PM Performed by: Josephine Igo Patient Re-evaluated:Patient Re-evaluated prior to inductionOxygen Delivery Method: Circle system utilized Preoxygenation: Pre-oxygenation with 100% oxygen Intubation Type: IV induction Ventilation: Mask ventilation without difficulty and Oral airway inserted - appropriate to patient size Laryngoscope Size: Mac and 3 Grade View: Grade I Tube type: Oral Tube size: 8.0 mm Number of attempts: 2 Airway Equipment and Method: Stylet Placement Confirmation: ETT inserted through vocal cords under direct vision,  positive ETCO2 and breath sounds checked- equal and bilateral Secured at: 22 cm Tube secured with: Tape Dental Injury: Teeth and Oropharynx as per pre-operative assessment

## 2017-03-02 NOTE — Interval H&P Note (Signed)
History and Physical Interval Note:  03/02/2017 3:26 PM  Jeremy Patrick  has presented today for surgery, with the diagnosis of BLADDER TUMOR  The various methods of treatment have been discussed with the patient and family. After consideration of risks, benefits and other options for treatment, the patient has consented to  Procedure(s) with comments: TRANSURETHRAL RESECTION OF BLADDER TUMOR/ EXAM UNDER ANESTHESIA/ WITH MITOMYCIN-C POST OPERATIVE (N/A) - GENERAL ANESTHESIA WITH PARALYSIS/  NEEDS 1 HOUR TOTAL CYSTOSCOPY WITH RETROGRADE PYELOGRAM (Bilateral) - GENERAL ANESTHESIA WITH PARALYSIS as a surgical intervention .  The patient's history has been reviewed, patient examined, no change in status, stable for surgery.  I have reviewed the patient's chart and labs.  Questions were answered to the patient's satisfaction.     Carmelite Violet,LES

## 2017-03-02 NOTE — Discharge Instructions (Signed)
1. You may see some blood in the urine and may have some burning with urination for 48-72 hours. You also may notice that you have to urinate more frequently or urgently after your procedure which is normal.  °2. You should call should you develop an inability urinate, fever > 101, persistent nausea and vomiting that prevents you from eating or drinking to stay hydrated.  °

## 2017-03-02 NOTE — Op Note (Signed)
Preoperative diagnosis: 1. 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 Cystoscopy 2. Transurethral resection of bladder tumor (3.5 cm) 3. Pelvic exam under anesthesia  Surgeon: Pryor Curia. M.D.  Anesthesia: General  Complications: None  Intraoperative findings:  1. Bladder tumor: A large 3.5 cm papillary tumor was seen extending from just inside the left bladder neck along the sidewall of the bladder.   EBL: Minimal  Specimens: 1. Left bladder neck tumor  Disposition of specimens: Pathology  Indication: Jeremy Patrick is a patient who was found to have a bladder tumor after presenting with gross hematuria.  His upper tract evaluation was negative.  After reviewing the management options for treatment, he elected to proceed with the above surgical procedure(s). We have discussed the potential benefits and risks of the procedure, side effects of the proposed treatment, the likelihood of the patient achieving the goals of the procedure, and any potential problems that might occur during the procedure or recuperation. Informed consent has been obtained.  Description of procedure:  The patient was taken to the operating room and general anesthesia was induced.  The patient was placed in the dorsal lithotomy position, prepped and draped in the usual sterile fashion, and preoperative antibiotics were administered. A preoperative time-out was performed.   Cystourethroscopy was performed.  The patient's urethra was examined and was normal.   The bladder was then systematically examined in its entirety. 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  There was a large 3.5 cm papillary tumor off the left lateral bladder wall just inside the bladder neck.  No other tumors or abnormalities were noted.  The bladder was then re-examined after the resectoscope was  placed.  The bladder tumor was 3.5 cm.  It was located laterally on the left just inside the bladder neck and appeared papillary. Using bipolar loop cautery resection, the entire tumor was resected and removed for permanent pathologic analysis.  Care was taken to resect down to detrusor muscle until all gross tumor was resected.  Hemostasis was then achieved with the loop cautery and the bladder was emptied and reinspected with no further bleeding noted at the end of the procedure.    Considering the lack of pathologic information and the suspicion that this represented an invasive and/or high grade tumor, it was decided not to give postoperative intravesical chemotherapy.  The bladder was then emptied and the procedure ended.  The patient appeared to tolerate the procedure well and without complications.  The patient was able to be awakened and transferred to the recovery unit in satisfactory condition.    Pryor Curia MD

## 2017-03-02 NOTE — Anesthesia Preprocedure Evaluation (Signed)
Anesthesia Evaluation  Patient identified by MRN, date of birth, ID band Patient awake    Reviewed: Allergy & Precautions, NPO status , Patient's Chart, lab work & pertinent test results  Airway Mallampati: II  TM Distance: >3 FB Neck ROM: Full    Dental  (+) Poor Dentition, Missing, Chipped   Pulmonary former smoker,    Pulmonary exam normal breath sounds clear to auscultation       Cardiovascular hypertension, Pt. on medications Normal cardiovascular exam Rhythm:Regular Rate:Normal     Neuro/Psych negative neurological ROS  negative psych ROS   GI/Hepatic Neg liver ROS, GERD  Medicated and Controlled,  Endo/Other  negative endocrine ROS  Renal/GU negative Renal ROS  negative genitourinary   Musculoskeletal negative musculoskeletal ROS (+)   Abdominal   Peds  Hematology negative hematology ROS (+)   Anesthesia Other Findings   Reproductive/Obstetrics                             Anesthesia Physical Anesthesia Plan  ASA: II  Anesthesia Plan: General   Post-op Pain Management:    Induction: Intravenous and Cricoid pressure planned  Airway Management Planned: Oral ETT  Additional Equipment:   Intra-op Plan:   Post-operative Plan: Extubation in OR  Informed Consent: I have reviewed the patients History and Physical, chart, labs and discussed the procedure including the risks, benefits and alternatives for the proposed anesthesia with the patient or authorized representative who has indicated his/her understanding and acceptance.   Dental advisory given  Plan Discussed with: CRNA, Anesthesiologist and Surgeon  Anesthesia Plan Comments:         Anesthesia Quick Evaluation

## 2017-03-02 NOTE — Anesthesia Postprocedure Evaluation (Signed)
Anesthesia Post Note  Patient: DREYDON CARDENAS  Procedure(s) Performed: Procedure(s) (LRB): TRANSURETHRAL RESECTION OF BLADDER TUMOR/ EXAM UNDER ANESTHESIA/ WITH MITOMYCIN-C POST OPERATIVE (N/A)  Patient location during evaluation: PACU Anesthesia Type: General Level of consciousness: awake and alert and oriented Pain management: pain level controlled Vital Signs Assessment: post-procedure vital signs reviewed and stable Respiratory status: spontaneous breathing, nonlabored ventilation and respiratory function stable Cardiovascular status: blood pressure returned to baseline and stable Postop Assessment: no signs of nausea or vomiting Anesthetic complications: no       Last Vitals:  Vitals:   03/02/17 1745 03/02/17 1800  BP: (!) 153/88 (!) 174/87  Pulse: 72 68  Resp: 16 17  Temp:  36.8 C    Last Pain:  Vitals:   03/02/17 1800  TempSrc:   PainSc: 0-No pain                 Mariaceleste Herrera A.

## 2017-03-02 NOTE — Transfer of Care (Signed)
Immediate Anesthesia Transfer of Care Note  Patient: Jeremy Patrick  Procedure(s) Performed: Procedure(s) with comments: TRANSURETHRAL RESECTION OF BLADDER TUMOR/ EXAM UNDER ANESTHESIA/ WITH MITOMYCIN-C POST OPERATIVE (N/A) - GENERAL ANESTHESIA WITH PARALYSIS/  NEEDS 1 HOUR TOTAL  Patient Location: PACU  Anesthesia Type:General  Level of Consciousness:  sedated, patient cooperative and responds to stimulation  Airway & Oxygen Therapy:Patient Spontanous Breathing and Patient connected to face mask oxgen  Post-op Assessment:  Report given to PACU RN and Post -op Vital signs reviewed and stable  Post vital signs:  Reviewed and stable  Last Vitals:  Vitals:   03/02/17 1350 03/02/17 1735  BP: (!) 168/82   Pulse:    Resp:    Temp:  (P) 16.8 C    Complications: No apparent anesthesia complications

## 2017-03-03 ENCOUNTER — Encounter: Payer: Self-pay | Admitting: Family

## 2017-03-03 ENCOUNTER — Ambulatory Visit (INDEPENDENT_AMBULATORY_CARE_PROVIDER_SITE_OTHER): Payer: Medicare Other | Admitting: Family

## 2017-03-03 VITALS — BP 136/80 | HR 65 | Temp 98.5°F | Resp 16 | Ht 72.0 in | Wt 213.0 lb

## 2017-03-03 DIAGNOSIS — I10 Essential (primary) hypertension: Secondary | ICD-10-CM | POA: Diagnosis not present

## 2017-03-03 DIAGNOSIS — K219 Gastro-esophageal reflux disease without esophagitis: Secondary | ICD-10-CM | POA: Diagnosis not present

## 2017-03-03 MED ORDER — DILTIAZEM HCL ER COATED BEADS 360 MG PO CP24: 360.0000 mg | ORAL_CAPSULE | Freq: Every day | ORAL | 1 refills | Status: DC

## 2017-03-03 MED ORDER — LISINOPRIL 40 MG PO TABS: 40.0000 mg | ORAL_TABLET | Freq: Every day | ORAL | 1 refills | Status: DC

## 2017-03-03 NOTE — Assessment & Plan Note (Signed)
Gastroesophageal reflux appears adequately controlled current medication regimen and no adverse side effects. No abdominal pain with a normal exam. Continue current dosage of Zantac.

## 2017-03-03 NOTE — Progress Notes (Signed)
Subjective:    Patient ID: Jeremy Patrick, male    DOB: 1946/09/20, 71 y.o.   MRN: [id number]  Chief Complaint  Patient presents with  . Establish Care    HPI:  Jeremy Patrick is a 71 y.o. male who  has a past medical history of GERD (gastroesophageal reflux disease) and Hypertension. and presents today for an office visit to establish care.  1.) Hypertension - Currently maintained on lisinopril and diltiazem. Reports taking the medication with missed doses on occasion and denies adverse side effects or hypotensive readings. Does not check blood pressure at home. Denies changes in vision, worst headache of life or new symptoms of end organ damage. Working on following a low sodium diet. No structured exercise.    BP Readings from Last 3 Encounters:  03/03/17 136/80  03/02/17 (!) 152/109  02/27/17 (!) 154/77   2. GERD - Currently maintained on Zantac. Reports taking the medications as prescribed and denies adverse side effects. Symptoms are generally well controlled with the current medication regimen.    No Known Allergies    Outpatient Medications Prior to Visit  Medication Sig Dispense Refill  . HYDROcodone-acetaminophen (NORCO/VICODIN) 5-325 MG tablet Take 1-2 tablets by mouth every 6 (six) hours as needed. 10 tablet 0  . phenazopyridine (PYRIDIUM) 100 MG tablet Take 1 tablet (100 mg total) by mouth 3 (three) times daily as needed for pain (for burning). 20 tablet 0  . ranitidine (ZANTAC) 300 MG tablet Take 300 mg by mouth daily.    Marland Kitchen diltiazem (CARDIZEM CD) 360 MG 24 hr capsule Take 360 mg by mouth daily.    Marland Kitchen lisinopril (PRINIVIL,ZESTRIL) 40 MG tablet Take 40 mg by mouth daily.     No facility-administered medications prior to visit.      Past Medical History:  Diagnosis Date  . GERD (gastroesophageal reflux disease)   . Hypertension       Past Surgical History:  Procedure Laterality Date  . BLADDER SURGERY        Family History  Problem Relation Age of  Onset  . Hypertension Mother   . Stroke Mother   . Diabetes Mother   . Cataracts Mother   . Kidney disease Mother   . Hypertension Father   . Stroke Father   . Aneurysm Maternal Grandmother       Social History   Social History  . Marital status: Single    Spouse name: N/A  . Number of children: 4  . Years of education: 10   Occupational History  . Retired    Social History Main Topics  . Smoking status: Former Smoker    Packs/day: 0.15    Years: 10.00    Types: Cigarettes  . Smokeless tobacco: Never Used  . Alcohol use Yes     Comment: occasionally  . Drug use: No     Comment: "years ago"  . Sexual activity: Not on file   Other Topics Concern  . Not on file   Social History Narrative   Fun/Hobby: Go to [employer] and travel.       Review of Systems  Constitutional: Negative for chills and fever.  Eyes:       Negative for changes in vision  Respiratory: Negative for cough, chest tightness and wheezing.   Cardiovascular: Negative for chest pain, palpitations and leg swelling.  Gastrointestinal: Negative for abdominal pain, anal bleeding, blood in stool, constipation, diarrhea, nausea, rectal pain and vomiting.  Neurological: Negative for dizziness, weakness  and light-headedness.       Objective:    BP 136/80 (BP Location: Right Arm, Patient Position: Sitting, Cuff Size: Large)   Pulse 65   Temp 98.5 F (36.9 C) (Oral)   Resp 16   Ht 6' (1.829 m)   Wt 213 lb (96.6 kg)   SpO2 97%   BMI 28.89 kg/m  Nursing note and vital signs reviewed.  Physical Exam  Constitutional: He is oriented to person, place, and time. He appears well-developed and well-nourished. No distress.  Cardiovascular: Normal rate, regular rhythm, normal heart sounds and intact distal pulses.   Pulmonary/Chest: Effort normal and breath sounds normal.  Neurological: He is alert and oriented to person, place, and time.  Skin: Skin is warm and dry.  Psychiatric: He has a normal mood and  affect. His behavior is normal. Judgment and thought content normal.        Assessment & Plan:   Problem List Items Addressed This Visit      Cardiovascular and Mediastinum   Essential hypertension - Primary    Blood pressure below goal 140/90 with current medication regimen and no adverse side effects despite less than full compliance with patient. Denies worst headache of life with no symptoms of end organ damage noted on physical exam. Encouraged to take medications as prescribed prevent end organ damage in the future. Continue current dosage of lisinopril and diltiazem. Encouraged to monitor blood pressure at home and follow sodium diet. Information on Dash eating plan provided and after visit summary. Continue to monitor.      Relevant Medications   diltiazem (CARDIZEM CD) 360 MG 24 hr capsule   lisinopril (PRINIVIL,ZESTRIL) 40 MG tablet     Digestive   Gastroesophageal reflux disease without esophagitis    Gastroesophageal reflux appears adequately controlled current medication regimen and no adverse side effects. No abdominal pain with a normal exam. Continue current dosage of Zantac.          I have changed Mr. Nuccio's diltiazem and lisinopril. I am also having him maintain his ranitidine, phenazopyridine, and HYDROcodone-acetaminophen.   Meds ordered this encounter  Medications  . diltiazem (CARDIZEM CD) 360 MG 24 hr capsule    Sig: Take 1 capsule (360 mg total) by mouth daily.    Dispense:  90 capsule    Refill:  1  . lisinopril (PRINIVIL,ZESTRIL) 40 MG tablet    Sig: Take 1 tablet (40 mg total) by mouth daily.    Dispense:  90 tablet    Refill:  1     Follow-up: Return in about 3 months (around 06/03/2017), or if symptoms worsen or fail to improve.  Mauricio Po, FNP

## 2017-03-03 NOTE — Assessment & Plan Note (Signed)
Blood pressure below goal 140/90 with current medication regimen and no adverse side effects despite less than full compliance with patient. Denies worst headache of life with no symptoms of end organ damage noted on physical exam. Encouraged to take medications as prescribed prevent end organ damage in the future. Continue current dosage of lisinopril and diltiazem. Encouraged to monitor blood pressure at home and follow sodium diet. Information on Dash eating plan provided and after visit summary. Continue to monitor.

## 2017-03-03 NOTE — Patient Instructions (Signed)
Thank you for choosing [facility].  SUMMARY AND INSTRUCTIONS:  Please continue to take your medications as prescribed.  Monitor your blood pressure at home daily at different times throughout the day. Record and bring numbers to office visits.  Schedule a time for your physical at your convenience.   Follow up with Dr. Alinda Money as scheduled.   Medication:  Your prescription(s) have been submitted to your pharmacy or been printed and provided for you. Please take as directed and contact our office if you believe you are having problem(s) with the medication(s) or have any questions.   Follow up:  If your symptoms worsen or fail to improve, please contact our office for further instruction, or in case of emergency go directly to the emergency room at the closest medical facility.     Food Choices for Gastroesophageal Reflux Disease, Adult When you have gastroesophageal reflux disease (GERD), the foods you eat and your eating habits are very important. Choosing the right foods can help ease your discomfort. What guidelines do I need to follow?  Choose fruits, vegetables, whole grains, and low-fat dairy products.  Choose low-fat meat, fish, and poultry.  Limit fats such as oils, salad dressings, butter, nuts, and avocado.  Keep a food diary. This helps you identify foods that cause symptoms.  Avoid foods that cause symptoms. These may be different for everyone.  Eat small meals often instead of 3 large meals a day.  Eat your meals slowly, in a place where you are relaxed.  Limit fried foods.  Cook foods using methods other than frying.  Avoid drinking alcohol.  Avoid drinking large amounts of liquids with your meals.  Avoid bending over or lying down until 2-3 hours after eating. What foods are not recommended? These are some foods and drinks that may make your symptoms worse: Vegetables  Tomatoes. Tomato juice. Tomato and spaghetti sauce. Chili peppers. Onion  and garlic. Horseradish. Fruits  Oranges, grapefruit, and lemon (fruit and juice). Meats  High-fat meats, fish, and poultry. This includes hot dogs, ribs, ham, sausage, salami, and bacon. Dairy  Whole milk and chocolate milk. Sour cream. Cream. Butter. Ice cream. Cream cheese. Drinks  Coffee and tea. Bubbly (carbonated) drinks or energy drinks. Condiments  Hot sauce. Barbecue sauce. Sweets/Desserts  Chocolate and cocoa. Donuts. Peppermint and spearmint. Fats and Oils  High-fat foods. This includes [place] fries and potato chips. Other  Vinegar. Strong spices. This includes black pepper, white pepper, red pepper, cayenne, curry powder, cloves, ginger, and chili powder. The items listed above may not be a complete list of foods and drinks to avoid. Contact your dietitian for more information.  This information is not intended to replace advice given to you by your health care provider. Make sure you discuss any questions you have with your health care provider. Document Released: 05/25/2012 Document Revised: 05/01/2016 Document Reviewed: 09/28/2013 Elsevier Interactive Patient Education  2017 [place] DASH stands for "Dietary Approaches to Stop Hypertension." The DASH eating plan is a healthy eating plan that has been shown to reduce high blood pressure (hypertension). It may also reduce your risk for type 2 diabetes, heart disease, and stroke. The DASH eating plan may also help with weight loss. What are tips for following this plan? General guidelines   Avoid eating more than 2,300 mg (milligrams) of salt (sodium) a day. If you have hypertension, you may need to reduce your sodium intake to 1,500 mg a day.  Limit alcohol intake  to no more than 1 drink a day for nonpregnant women and 2 drinks a day for men. One drink equals 12 oz of beer, 5 oz of wine, or 1 oz of hard liquor.  Work with your health care provider to maintain a healthy body weight or to lose  weight. Ask what an ideal weight is for you.  Get at least 30 minutes of exercise that causes your heart to beat faster (aerobic exercise) most days of the week. Activities may include walking, swimming, or biking.  Work with your health care provider or diet and nutrition specialist (dietitian) to adjust your eating plan to your individual calorie needs. Reading food labels   Check food labels for the amount of sodium per serving. Choose foods with less than 5 percent of the Daily Value of sodium. Generally, foods with less than 300 mg of sodium per serving fit into this eating plan.  To find whole grains, look for the word "whole" as the first word in the ingredient list. Shopping   Buy products labeled as "low-sodium" or "no salt added."  Buy fresh foods. Avoid canned foods and premade or frozen meals. Cooking   Avoid adding salt when cooking. Use salt-free seasonings or herbs instead of table salt or sea salt. Check with your health care provider or pharmacist before using salt substitutes.  Do not fry foods. Cook foods using healthy methods such as baking, boiling, grilling, and broiling instead.  Cook with heart-healthy oils, such as olive, canola, soybean, or sunflower oil. Meal planning    Eat a balanced diet that includes:  5 or more servings of fruits and vegetables each day. At each meal, try to fill half of your plate with fruits and vegetables.  Up to 6-8 servings of whole grains each day.  Less than 6 oz of lean meat, poultry, or fish each day. A 3-oz serving of meat is about the same size as a deck of cards. One egg equals 1 oz.  2 servings of low-fat dairy each day.  A serving of nuts, seeds, or beans 5 times each week.  Heart-healthy fats. Healthy fats called Omega-3 fatty acids are found in foods such as flaxseeds and coldwater fish, like sardines, salmon, and mackerel.  Limit how much you eat of the following:  Canned or prepackaged foods.  Food that is  high in trans fat, such as fried foods.  Food that is high in saturated fat, such as fatty meat.  Sweets, desserts, sugary drinks, and other foods with added sugar.  Full-fat dairy products.  Do not salt foods before eating.  Try to eat at least 2 vegetarian meals each week.  Eat more home-cooked food and less restaurant, buffet, and fast food.  When eating at a restaurant, ask that your food be prepared with less salt or no salt, if possible. What foods are recommended? The items listed may not be a complete list. Talk with your dietitian about what dietary choices are best for you. Grains  Whole-grain or whole-wheat bread. Whole-grain or whole-wheat pasta. Brown rice. [place]. Bulgur. Whole-grain and low-sodium cereals. Pita bread. Low-fat, low-sodium crackers. Whole-wheat flour tortillas. Vegetables  Fresh or frozen vegetables (raw, steamed, roasted, or grilled). Low-sodium or reduced-sodium tomato and vegetable juice. Low-sodium or reduced-sodium tomato sauce and tomato paste. Low-sodium or reduced-sodium canned vegetables. Fruits  All fresh, dried, or frozen fruit. Canned fruit in natural juice (without added sugar). Meat and other protein foods  Skinless chicken or [place]. Ground chicken  or [place]. Pork with fat trimmed off. Fish and seafood. Egg whites. Dried beans, peas, or lentils. Unsalted nuts, nut butters, and seeds. Unsalted canned beans. Lean cuts of beef with fat trimmed off. Low-sodium, lean deli meat. Dairy  Low-fat (1%) or fat-free (skim) milk. Fat-free, low-fat, or reduced-fat cheeses. Nonfat, low-sodium ricotta or cottage cheese. Low-fat or nonfat yogurt. Low-fat, low-sodium cheese. Fats and oils  Soft margarine without trans fats. Vegetable oil. Low-fat, reduced-fat, or light mayonnaise and salad dressings (reduced-sodium). Canola, safflower, olive, soybean, and sunflower oils. Avocado. Seasoning and other foods  Herbs. Spices. Seasoning mixes without salt.  Unsalted popcorn and pretzels. Fat-free sweets. What foods are not recommended? The items listed may not be a complete list. Talk with your dietitian about what dietary choices are best for you. Grains  Baked goods made with fat, such as croissants, muffins, or some breads. Dry pasta or rice meal packs. Vegetables  Creamed or fried vegetables. Vegetables in a cheese sauce. Regular canned vegetables (not low-sodium or reduced-sodium). Regular canned tomato sauce and paste (not low-sodium or reduced-sodium). Regular tomato and vegetable juice (not low-sodium or reduced-sodium). Angie Fava. Olives. Fruits  Canned fruit in a light or heavy syrup. Fried fruit. Fruit in cream or butter sauce. Meat and other protein foods  Fatty cuts of meat. Ribs. Fried meat. Berniece Salines. Sausage. Bologna and other processed lunch meats. Salami. Fatback. Hotdogs. Bratwurst. Salted nuts and seeds. Canned beans with added salt. Canned or smoked fish. Whole eggs or egg yolks. Chicken or [place] with skin. Dairy  Whole or 2% milk, cream, and half-and-half. Whole or full-fat cream cheese. Whole-fat or sweetened yogurt. Full-fat cheese. Nondairy creamers. Whipped toppings. Processed cheese and cheese spreads. Fats and oils  Butter. Stick margarine. Lard. Shortening. Ghee. Bacon fat. Tropical oils, such as coconut, palm kernel, or palm oil. Seasoning and other foods  Salted popcorn and pretzels. Onion salt, garlic salt, seasoned salt, table salt, and sea salt. Worcestershire sauce. Tartar sauce. Barbecue sauce. Teriyaki sauce. Soy sauce, including reduced-sodium. Steak sauce. Canned and packaged gravies. Fish sauce. Oyster sauce. Cocktail sauce. Horseradish that you find on the shelf. Ketchup. Mustard. Meat flavorings and tenderizers. Bouillon cubes. Hot sauce and Tabasco sauce. Premade or packaged marinades. Premade or packaged taco seasonings. Relishes. Regular salad dressings. Where to find more information:  National Heart, Lung,  and [place]: https://wilson-eaton.com/  American Heart Association: www.heart.org Summary  The DASH eating plan is a healthy eating plan that has been shown to reduce high blood pressure (hypertension). It may also reduce your risk for type 2 diabetes, heart disease, and stroke.  With the DASH eating plan, you should limit salt (sodium) intake to 2,300 mg a day. If you have hypertension, you may need to reduce your sodium intake to 1,500 mg a day.  When on the DASH eating plan, aim to eat more fresh fruits and vegetables, whole grains, lean proteins, low-fat dairy, and heart-healthy fats.  Work with your health care provider or diet and nutrition specialist (dietitian) to adjust your eating plan to your individual calorie needs. This information is not intended to replace advice given to you by your health care provider. Make sure you discuss any questions you have with your health care provider. Document Released: 11/13/2011 Document Revised: 11/17/2016 Document Reviewed: 11/17/2016 Elsevier Interactive Patient Education  2017 [employer].

## 2017-03-23 ENCOUNTER — Encounter: Payer: Medicare Other | Admitting: Family

## 2017-03-25 ENCOUNTER — Other Ambulatory Visit: Payer: Self-pay | Admitting: Urology

## 2017-03-30 ENCOUNTER — Ambulatory Visit (INDEPENDENT_AMBULATORY_CARE_PROVIDER_SITE_OTHER): Payer: Medicare Other | Admitting: Family

## 2017-03-30 ENCOUNTER — Encounter: Payer: Self-pay | Admitting: Family

## 2017-03-30 ENCOUNTER — Other Ambulatory Visit (INDEPENDENT_AMBULATORY_CARE_PROVIDER_SITE_OTHER): Payer: Medicare Other

## 2017-03-30 VITALS — BP 144/80 | HR 63 | Temp 98.1°F | Resp 16 | Ht 72.0 in | Wt 209.8 lb

## 2017-03-30 DIAGNOSIS — Z7189 Other specified counseling: Secondary | ICD-10-CM | POA: Insufficient documentation

## 2017-03-30 DIAGNOSIS — C679 Malignant neoplasm of bladder, unspecified: Secondary | ICD-10-CM | POA: Diagnosis not present

## 2017-03-30 DIAGNOSIS — Z0000 Encounter for general adult medical examination without abnormal findings: Secondary | ICD-10-CM

## 2017-03-30 DIAGNOSIS — I10 Essential (primary) hypertension: Secondary | ICD-10-CM | POA: Diagnosis not present

## 2017-03-30 DIAGNOSIS — Z7289 Other problems related to lifestyle: Secondary | ICD-10-CM | POA: Diagnosis not present

## 2017-03-30 LAB — COMPREHENSIVE METABOLIC PANEL
ALT: 15 U/L (ref 0–53)
AST: 17 U/L (ref 0–37)
Albumin: 4.3 g/dL (ref 3.5–5.2)
Alkaline Phosphatase: 67 U/L (ref 39–117)
BUN: 15 mg/dL (ref 6–23)
CO2: 31 mEq/L (ref 19–32)
Calcium: 9.9 mg/dL (ref 8.4–10.5)
Chloride: 106 mEq/L (ref 96–112)
Creatinine, Ser: 1.39 mg/dL (ref 0.40–1.50)
GFR: 65.12 mL/min (ref >60.00)
Glucose, Bld: 87 mg/dL (ref 70–99)
Potassium: 4.3 mEq/L (ref 3.5–5.1)
Sodium: 143 mEq/L (ref 135–145)
Total Bilirubin: 0.4 mg/dL (ref 0.2–1.2)
Total Protein: 7.5 g/dL (ref 6.0–8.3)

## 2017-03-30 LAB — PSA: PSA: 1.03 ng/mL (ref 0.10–4.00)

## 2017-03-30 LAB — LIPID PANEL
Cholesterol: 192 mg/dL (ref 0–200)
HDL: 39.1 mg/dL (ref >39.00)
LDL Cholesterol: 131 mg/dL — ABNORMAL HIGH (ref 0–99)
NonHDL: 152.9
Total CHOL/HDL Ratio: 5
Triglycerides: 112 mg/dL (ref 0.0–149.0)
VLDL: 22.4 mg/dL (ref 0.0–40.0)

## 2017-03-30 LAB — CBC
HCT: 39.2 % (ref 39.0–52.0)
Hemoglobin: 12.7 g/dL — ABNORMAL LOW (ref 13.0–17.0)
MCHC: 32.3 g/dL (ref 30.0–36.0)
MCV: 85.5 fl (ref 78.0–100.0)
Platelets: 282 10*3/uL (ref 150.0–400.0)
RBC: 4.59 Mil/uL (ref 4.22–5.81)
RDW: 14.1 % (ref 11.5–15.5)
WBC: 6.7 10*3/uL (ref 4.0–10.5)

## 2017-03-30 NOTE — Patient Instructions (Signed)
Thank you for choosing [facility].  SUMMARY AND INSTRUCTIONS:  Please continue to take your medications as prescribed.  Work on a nutritional intake that is moderate, varied, and balanced.   Gradually increase physical activity to 30 minutes most days of the week.  Labs:  Please stop by the lab on the lower level of the building for your blood work. Your results will be released to [facility] (or called to you) after review, usually within 72 hours after test completion. If any changes need to be made, you will be notified at that same time.  1.) The lab is open from 7:30am to 5:30 pm Monday-Friday 2.) No appointment is necessary 3.) Fasting (if needed) is 6-8 hours after food and drink; black coffee and water are okay   Follow up:  If your symptoms worsen or fail to improve, please contact our office for further instruction, or in case of emergency go directly to the emergency room at the closest medical facility.   Health Maintenance  Topic Date Due  . Hepatitis C Screening  08-27-1946  . TETANUS/TDAP  01/24/1967  . COLONOSCOPY  01/24/1998  . PNA vac Low Risk Adult (1 of 2 - PCV13) 01/24/2013  . INFLUENZA VACCINE  07/08/2017    Health Maintenance, Male A healthy lifestyle and preventive care is important for your health and wellness. Ask your health care provider about what schedule of regular examinations is right for you. What should I know about weight and diet?  Eat a Healthy Diet  Eat plenty of vegetables, fruits, whole grains, low-fat dairy products, and lean protein.  Do not eat a lot of foods high in solid fats, added sugars, or salt. Maintain a Healthy Weight  Regular exercise can help you achieve or maintain a healthy weight. You should:  Do at least 150 minutes of exercise each week. The exercise should increase your heart rate and make you sweat (moderate-intensity exercise).  Do strength-training exercises at least twice a week. Watch Your Levels of  Cholesterol and Blood Lipids  Have your blood tested for lipids and cholesterol every 5 years starting at 70 years of age. If you are at high risk for heart disease, you should start having your blood tested when you are 69 years old. You may need to have your cholesterol levels checked more often if:  Your lipid or cholesterol levels are high.  You are older than 69 years of age.  You are at high risk for heart disease. What should I know about cancer screening? Many types of cancers can be detected early and may often be prevented. Lung Cancer  You should be screened every year for lung cancer if:  You are a current smoker who has smoked for at least 30 years.  You are a former smoker who has quit within the past 15 years.  Talk to your health care provider about your screening options, when you should start screening, and how often you should be screened. Colorectal Cancer  Routine colorectal cancer screening usually begins at 69 years of age and should be repeated every 5-10 years until you are 69 years old. You may need to be screened more often if early forms of precancerous polyps or small growths are found. Your health care provider may recommend screening at an earlier age if you have risk factors for colon cancer.  Your health care provider may recommend using home test kits to check for hidden blood in the stool.  A small camera at  the end of a tube can be used to examine your colon (sigmoidoscopy or colonoscopy). This checks for the earliest forms of colorectal cancer. Prostate and Testicular Cancer  Depending on your age and overall health, your health care provider may do certain tests to screen for prostate and testicular cancer.  Talk to your health care provider about any symptoms or concerns you have about testicular or prostate cancer. Skin Cancer  Check your skin from head to toe regularly.  Tell your health care provider about any new moles or changes in  moles, especially if:  There is a change in a mole's size, shape, or color.  You have a mole that is larger than a pencil eraser.  Always use sunscreen. Apply sunscreen liberally and repeat throughout the day.  Protect yourself by wearing long sleeves, pants, a wide-brimmed hat, and sunglasses when outside. What should I know about heart disease, diabetes, and high blood pressure?  If you are 9-48 years of age, have your blood pressure checked every 3-5 years. If you are 50 years of age or older, have your blood pressure checked every year. You should have your blood pressure measured twice-once when you are at a hospital or clinic, and once when you are not at a hospital or clinic. Record the average of the two measurements. To check your blood pressure when you are not at a hospital or clinic, you can use:  An automated blood pressure machine at a pharmacy.  A home blood pressure monitor.  Talk to your health care provider about your target blood pressure.  If you are between 14-2 years old, ask your health care provider if you should take aspirin to prevent heart disease.  Have regular diabetes screenings by checking your fasting blood sugar level.  If you are at a normal weight and have a low risk for diabetes, have this test once every three years after the age of 61.  If you are overweight and have a high risk for diabetes, consider being tested at a younger age or more often.  A one-time screening for abdominal aortic aneurysm (AAA) by ultrasound is recommended for men aged 59-75 years who are current or former smokers. What should I know about preventing infection? Hepatitis B  If you have a higher risk for hepatitis B, you should be screened for this virus. Talk with your health care provider to find out if you are at risk for hepatitis B infection. Hepatitis C  Blood testing is recommended for:  Everyone born from [birth year] through 1963.  Anyone with known risk factors for  hepatitis C. Sexually Transmitted Diseases (STDs)  You should be screened each year for STDs including gonorrhea and chlamydia if:  You are sexually active and are younger than 69 years of age.  You are older than 69 years of age and your health care provider tells you that you are at risk for this type of infection.  Your sexual activity has changed since you were last screened and you are at an increased risk for chlamydia or gonorrhea. Ask your health care provider if you are at risk.  Talk with your health care provider about whether you are at high risk of being infected with HIV. Your health care provider may recommend a prescription medicine to help prevent HIV infection. What else can I do?  Schedule regular health, dental, and eye exams.  Stay current with your vaccines (immunizations).  Do not use any tobacco products, such as cigarettes, chewing  tobacco, and e-cigarettes. If you need help quitting, ask your health care provider.  Limit alcohol intake to no more than 2 drinks per day. One drink equals 12 ounces of beer, 5 ounces of wine, or 1 ounces of hard liquor.  Do not use street drugs.  Do not share needles.  Ask your health care provider for help if you need support or information about quitting drugs.  Tell your health care provider if you often feel depressed.  Tell your health care provider if you have ever been abused or do not feel safe at home. This information is not intended to replace advice given to you by your health care provider. Make sure you discuss any questions you have with your health care provider. Document Released: 05/22/2008 Document Revised: 07/23/2016 Document Reviewed: 08/28/2015 Elsevier Interactive Patient Education  2017 [place] Directive Advance directives are legal documents that let you make choices ahead of time about your health care and medical treatment in case you become unable to communicate for yourself.  Advance directives are a way for you to communicate your wishes to family, friends, and health care providers. This can help convey your decisions about end-of-life care if you become unable to communicate. Discussing and writing advance directives should happen over time rather than all at once. Advance directives can be changed depending on your situation and what you want, even after you have signed the advance directives. If you do not have an advance directive, some states assign family decision makers to act on your behalf based on how closely you are related to them. Each state has its own laws regarding advance directives. You may want to check with your health care provider, attorney, or state representative about the laws in your state. There are different types of advance directives, such as:  Medical power of attorney.  Living will.  Do not resuscitate (DNR) or do not attempt resuscitation (DNAR) order. Health care proxy and medical power of attorney A health care proxy, also called a health care agent, is a person who is appointed to make medical decisions for you in cases in which you are unable to make the decisions yourself. Generally, people choose someone they know well and trust to represent their preferences. Make sure to ask this person for an agreement to act as your proxy. A proxy may have to exercise judgment in the event of a medical decision for which your wishes are not known. A medical power of attorney is a legal document that names your health care proxy. Depending on the laws in your state, after the document is written, it may also need to be:  Signed.  Notarized.  Dated.  Copied.  Witnessed.  Incorporated into your medical record. You may also want to appoint someone to manage your financial affairs in a situation in which you are unable to do so. This is called a durable power of attorney for finances. It is a separate legal document from the durable power of  attorney for health care. You may choose the same person or someone different from your health care proxy to act as your agent in financial matters. If you do not appoint a proxy, or if there is a concern that the proxy is not acting in your best interests, a court-appointed guardian may be designated to act on your behalf. Living will A living will is a set of instructions documenting your wishes about medical care when you cannot express them yourself.  Health care providers should keep a copy of your living will in your medical record. You may want to give a copy to family members or friends. To alert caregivers in case of an emergency, you can place a card in your wallet to let them know that you have a living will and where they can find it. A living will is used if you become:  Terminally ill.  Incapacitated.  Unable to communicate or make decisions. Items to consider in your living will include:  The use or non-use of life-sustaining equipment, such as dialysis machines and breathing machines (ventilators).  A DNR or DNAR order, which is the instruction not to use cardiopulmonary resuscitation (CPR) if breathing or heartbeat stops.  The use or non-use of tube feeding.  Withholding of food and fluids.  Comfort (palliative) care when the goal becomes comfort rather than a cure.  Organ and tissue donation. A living will does not give instructions for distributing your money and property if you should pass away. It is recommended that you seek the advice of a lawyer when writing a will. Decisions about taxes, beneficiaries, and asset distribution will be legally binding. This process can relieve your family and friends of any concerns surrounding disputes or questions that may come up about the distribution of your assets. DNR or DNAR A DNR or DNAR order is a request not to have CPR in the event that your heart stops beating or you stop breathing. If a DNR or DNAR order has not been made  and shared, a health care provider will try to help any patient whose heart has stopped or who has stopped breathing. If you plan to have surgery, talk with your health care provider about how your DNR or DNAR order will be followed if problems occur. Summary  Advance directives are the legal documents that allow you to make choices ahead of time about your health care and medical treatment in case you become unable to communicate for yourself.  The process of discussing and writing advance directives should happen over time. You can change the advance directives, even after you have signed them.  Advance directives include DNR or DNAR orders, living wills, and designating an agent as your medical power of attorney. This information is not intended to replace advice given to you by your health care provider. Make sure you discuss any questions you have with your health care provider. Document Released: 03/02/2008 Document Revised: 10/13/2016 Document Reviewed: 10/13/2016 Elsevier Interactive Patient Education  2017 [employer].

## 2017-03-30 NOTE — Assessment & Plan Note (Signed)
Reviewed and updated patient's medical, surgical, family and social history. Medications and allergies were also reviewed. Basic screenings for depression, activities of daily living, hearing, cognition and safety were performed. Provider list was updated and health plan was provided to the patient.  

## 2017-03-30 NOTE — Patient Instructions (Addendum)
OMKAR STRATMANN  03/30/2017   Your procedure is scheduled on: 04-06-17  Report to [facility] Main  Entrance    Take [place]  elevators to 3rd floor to  [place] at 930AM.     Call this number if you have problems the morning of surgery [phone]   Remember: ONLY 1 PERSON MAY GO WITH YOU TO SHORT STAY TO GET  READY MORNING OF [place].  Do not eat food or drink liquids :After Midnight.     Take these medicines the morning of surgery with A SIP OF WATER: ranitidine(zantac)                                You may not have any metal on your body including hair pins and              piercings  Do not wear jewelry, make-up, lotions, powders or perfumes, deodorant                         Men may shave face and neck.   Do not bring valuables to the hospital. [place].  Contacts, dentures or bridgework may not be worn into surgery.      Patients discharged the day of surgery will not be allowed to drive home.  Name and phone number of your driver:  Special Instructions: N/A              Please read over the following fact sheets you were given: _____________________________________________________________________             [facility] - Preparing for Surgery Before surgery, you can play an important role.  Because skin is not sterile, your skin needs to be as free of germs as possible.  You can reduce the number of germs on your skin by washing with CHG (chlorahexidine gluconate) soap before surgery.  CHG is an antiseptic cleaner which kills germs and bonds with the skin to continue killing germs even after washing. Please DO NOT use if you have an allergy to CHG or antibacterial soaps.  If your skin becomes reddened/irritated stop using the CHG and inform your nurse when you arrive at Short Stay. Do not shave (including legs and underarms) for at least 48 hours prior to the first CHG shower.  You  may shave your face/neck. Please follow these instructions carefully:  1.  Shower with CHG Soap the night before surgery and the  morning of Surgery.  2.  If you choose to wash your hair, wash your hair first as usual with your  normal  shampoo.  3.  After you shampoo, rinse your hair and body thoroughly to remove the  shampoo.                           4.  Use CHG as you would any other liquid soap.  You can apply chg directly  to the skin and wash                       Gently with a scrungie or clean washcloth.  5.  Apply the CHG Soap  to your body ONLY FROM THE NECK DOWN.   Do not use on face/ open                           Wound or open sores. Avoid contact with eyes, ears mouth and genitals (private parts).                       Wash face,  Genitals (private parts) with your normal soap.             6.  Wash thoroughly, paying special attention to the area where your surgery  will be performed.  7.  Thoroughly rinse your body with warm water from the neck down.  8.  DO NOT shower/wash with your normal soap after using and rinsing off  the CHG Soap.                9.  Pat yourself dry with a clean towel.            10.  Wear clean pajamas.            11.  Place clean sheets on your bed the night of your first shower and do not  sleep with pets. Day of Surgery : Do not apply any lotions/deodorants the morning of surgery.  Please wear clean clothes to the hospital/surgery center.  FAILURE TO FOLLOW THESE INSTRUCTIONS MAY RESULT IN THE CANCELLATION OF YOUR SURGERY PATIENT SIGNATURE_________________________________  NURSE SIGNATURE__________________________________  ________________________________________________________________________

## 2017-03-30 NOTE — Progress Notes (Addendum)
LOV Dr Florene Route 03-30-17 in epic, notes awareness of surgery in assess/plan section EKG 02-27-17 epic CXR 02-27-17 epic

## 2017-03-30 NOTE — Assessment & Plan Note (Signed)
Blood pressure appears adequately controlled current medication regimen and no adverse side effects. Continue current dosage lisinopril.

## 2017-03-30 NOTE — Assessment & Plan Note (Signed)
Noted to have bladder cancer and undergoing current treatment by urology. Scheduled for secondary surgery. Follow-up and additional treatment per urology. Continue to monitor.

## 2017-03-30 NOTE — Assessment & Plan Note (Signed)
1) Anticipatory Guidance: Discussed importance of wearing a seatbelt while driving and not texting while driving; changing batteries in smoke detector at least once annually; wearing suntan lotion when outside; eating a balanced and moderate diet; getting physical activity at least 30 minutes per day.  2) Immunizations / Screenings / Labs:  Declines Tetanus and Prevnar today secondary to up coming surgery. Obtain PSA for prostate cancer screening. Obtain hepatitis C antibody for hepatitis C screening. Indicates he had a colonoscopy approximately 4 years ago. Requested results of colonoscopy and follow-up. Consider referral to gastroenterology for new colonoscopy if necessary. All other screenings are up-to-date per recommendations. Obtain CBC, CMET, and lipid profile.    Overall well exam with risk factors for cardiovascular disease including hypertension. He is also currently undergoing treatment for bladder cancer which is managed by urology. Blood pressure appears adequately controlled current medication regimen and no adverse side effects. Encouraged increasing physical activity to goal of 30 minutes of moderate level activity daily. BMI does indicate overweight. Continue other healthy lifestyle behaviors and choices. Follow-up prevention exam in 1 year. Follow-up office visit pending blood work and for chronic conditions.

## 2017-03-30 NOTE — Progress Notes (Signed)
Subjective:    Patient ID: Jeremy Patrick, male    DOB: [birth year], 71 y.o.   MRN: [id number]  Chief Complaint  Patient presents with  . CPE    not fasting     HPI:  Jeremy Patrick is a 71 y.o. male who presents today for a Medicare Annual Wellness/Physical exam.    1) Health Maintenance -   Diet - Averages about 2-3 meals per day consisting of a regular diet; Caffeine intake of 1 cup on occasion  Exercise - Rarely   2) Preventative Exams / Immunizations:  Dental -- Up to date  Vision -- Up to date   Health Maintenance  Topic Date Due  . Hepatitis C Screening  Apr 19, 1946  . TETANUS/TDAP  01/24/1967  . COLONOSCOPY  01/24/1998  . PNA vac Low Risk Adult (1 of 2 - PCV13) 01/24/2013  . INFLUENZA VACCINE  07/08/2017     There is no immunization history on file for this patient.  RISK FACTORS  Tobacco History  Smoking Status  . Former Smoker  . Packs/day: 0.15  . Years: 10.00  . Types: Cigarettes  Smokeless Tobacco  . Never Used     Cardiac risk factors: advanced age (older than 67 for men, 75 for women), hypertension, male gender and sedentary lifestyle.  Depression Screen  Depression screen [facility] 2/9 03/30/2017  Decreased Interest 0  Down, Depressed, Hopeless 0  PHQ - 2 Score 0     Activities of Daily Living In your present state of health, do you have any difficulty performing the following activities?:  Driving? No Managing money?  No Feeding yourself? No Getting from bed to chair? No Climbing a flight of stairs? No Preparing food and eating?: No Bathing or showering? No Getting dressed: No Getting to the toilet? No Using the toilet: No Moving around from place to place: No In the past year have you fallen or had a near fall?:No   Home Safety Has smoke detector and wears seat belts. No firearms. No excess sun exposure. Are there smokers in your home (other than you)?  No Do you feel safe at home?  Yes  Hearing Difficulties: Yes Do you  often ask people to speak up or repeat themselves? Yes Do you experience ringing or noises in your ears? No  Do you have difficulty understanding soft or whispered voices? No    Cognitive Testing  Alert? Yes   Normal Appearance? Yes  Oriented to person? Yes  Place? Yes   Time? Yes  Recall of three objects?  Yes  Can perform simple calculations? Yes  Displays appropriate judgment? Yes  Can read the correct time from a watch face? Yes  Do you feel that you have a problem with memory? No  Do you often misplace items? No   Advanced Directives have been discussed with the patient? Yes   Current Physicians/Providers and Suppliers  1. Terri Piedra, FNP - Internal Medicine 2. Raynelle Bring, MD - Urology  Indicate any recent Medical Services you may have received from other than [facility] providers in the past year (date may be approximate).  All answers were reviewed with the patient and necessary referrals were made:  Mauricio Po, [place]   03/30/2017    No Known Allergies   Outpatient Medications Prior to Visit  Medication Sig Dispense Refill  . aspirin EC 81 MG tablet Take 81 mg by mouth daily.    Marland Kitchen lisinopril (PRINIVIL,ZESTRIL) 40 MG tablet Take 1 tablet (40 mg  total) by mouth daily. 90 tablet 1  . ranitidine (ZANTAC) 300 MG tablet Take 300 mg by mouth daily.    Marland Kitchen HYDROcodone-acetaminophen (NORCO/VICODIN) 5-325 MG tablet Take 1-2 tablets by mouth every 6 (six) hours as needed. 10 tablet 0  . diltiazem (CARDIZEM CD) 360 MG 24 hr capsule Take 1 capsule (360 mg total) by mouth daily. 90 capsule 1  . phenazopyridine (PYRIDIUM) 100 MG tablet Take 1 tablet (100 mg total) by mouth 3 (three) times daily as needed for pain (for burning). (Patient not taking: Reported on 03/30/2017) 20 tablet 0   No facility-administered medications prior to visit.      Past Medical History:  Diagnosis Date  . GERD (gastroesophageal reflux disease)   . Hypertension      Past Surgical History:    Procedure Laterality Date  . BLADDER SURGERY    . TRANSURETHRAL RESECTION OF BLADDER TUMOR WITH MITOMYCIN-C N/A 03/02/2017   Procedure: TRANSURETHRAL RESECTION OF BLADDER TUMOR/ EXAM UNDER ANESTHESIA/ WITH MITOMYCIN-C POST OPERATIVE;  Surgeon: Raynelle Bring, MD;  Location: [facility];  Service: Urology;  Laterality: N/A;     Family History  Problem Relation Age of Onset  . Hypertension Mother   . Stroke Mother   . Diabetes Mother   . Cataracts Mother   . Kidney disease Mother   . Hypertension Father   . Stroke Father   . Aneurysm Maternal Grandmother      Social History   Social History  . Marital status: Single    Spouse name: N/A  . Number of children: 4  . Years of education: 10   Occupational History  . Retired    Social History Main Topics  . Smoking status: Former Smoker    Packs/day: 0.15    Years: 10.00    Types: Cigarettes  . Smokeless tobacco: Never Used  . Alcohol use Yes     Comment: occasionally  . Drug use: No     Comment: "years ago"  . Sexual activity: Not on file   Other Topics Concern  . Not on file   Social History Narrative   Fun/Hobby: Go to [employer] and travel.      Review of Systems  Constitutional: Denies fever, chills, fatigue, or significant weight gain/loss. HENT: Head: Denies headache or neck pain Ears: Denies changes in hearing, ringing in ears, earache, drainage Nose: Denies discharge, stuffiness, itching, nosebleed, sinus pain Throat: Denies sore throat, hoarseness, dry mouth, sores, thrush Eyes: Denies loss/changes in vision, pain, redness, blurry/double vision, flashing lights Cardiovascular: Denies chest pain/discomfort, tightness, palpitations, shortness of breath with activity, difficulty lying down, swelling, sudden awakening with shortness of breath Respiratory: Denies shortness of breath, cough, sputum production, wheezing Gastrointestinal: Denies dysphasia, heartburn, change in appetite, nausea, change in bowel habits,  rectal bleeding, constipation, diarrhea, yellow skin or eyes Genitourinary: Denies frequency, urgency, burning/pain, blood in urine, incontinence, change in urinary strength. Musculoskeletal: Denies muscle/joint pain, stiffness, back pain, redness or swelling of joints, trauma Skin: Denies rashes, lumps, itching, dryness, color changes, or hair/nail changes Neurological: Denies dizziness, fainting, seizures, weakness, numbness, tingling, tremor Psychiatric - Denies nervousness, stress, depression or memory loss Endocrine: Denies heat or cold intolerance, sweating, frequent urination, excessive thirst, changes in appetite Hematologic: Denies ease of bruising or bleeding    Objective:     BP (!) 144/80 (BP Location: Left Arm, Patient Position: Sitting, Cuff Size: Large)   Pulse 63   Temp 98.1 F (36.7 C) (Oral)   Resp 16   Ht  6' (1.829 m)   Wt 209 lb 12.8 oz (95.2 kg)   SpO2 95%   BMI 28.45 kg/m  Nursing note and vital signs reviewed.   Physical Exam  Constitutional: He is oriented to person, place, and time. He appears well-developed and well-nourished.  HENT:  Head: Normocephalic.  Right Ear: Hearing, tympanic membrane, external ear and ear canal normal.  Left Ear: Hearing, tympanic membrane, external ear and ear canal normal.  Nose: Nose normal.  Mouth/Throat: Uvula is midline, oropharynx is clear and moist and mucous membranes are normal.  Eyes: Conjunctivae and EOM are normal. Pupils are equal, round, and reactive to light.  Neck: Neck supple. No JVD present. No tracheal deviation present. No thyromegaly present.  Cardiovascular: Normal rate, regular rhythm, normal heart sounds and intact distal pulses.   Pulmonary/Chest: Effort normal and breath sounds normal.  Abdominal: Soft. Bowel sounds are normal. He exhibits no distension and no mass. There is no tenderness. There is no rebound and no guarding.  Musculoskeletal: Normal range of motion. He exhibits no edema or  tenderness.  Lymphadenopathy:    He has no cervical adenopathy.  Neurological: He is alert and oriented to person, place, and time. He has normal reflexes. No cranial nerve deficit. He exhibits normal muscle tone. Coordination normal.  Skin: Skin is warm and dry.  Psychiatric: He has a normal mood and affect. His behavior is normal. Judgment and thought content normal.       Assessment & Plan:   During the course of the visit the patient was educated and counseled about appropriate screening and preventive services including:    Pneumococcal vaccine   Influenza vaccine  Td vaccine  Prostate cancer screening  Colorectal cancer screening  Nutrition counseling   Diet review for nutrition referral? Yes ____  Not Indicated _X___   Patient Instructions (the written plan) was given to the patient.  Medicare Attestation I have personally reviewed: The patient's medical and social history Their use of alcohol, tobacco or illicit drugs Their current medications and supplements The patient's functional ability including ADLs,fall risks, home safety risks, cognitive, and hearing and visual impairment Diet and physical activities Evidence for depression or mood disorders  The patient's weight, height, BMI,  have been recorded in the chart.  I have made referrals, counseling, and provided education to the patient based on review of the above and I have provided the patient with a written personalized care plan for preventive services.     Problem List Items Addressed This Visit      Cardiovascular and Mediastinum   Essential hypertension    Blood pressure appears adequately controlled current medication regimen and no adverse side effects. Continue current dosage lisinopril.        Genitourinary   Malignant neoplasm of urinary bladder (HCC)    Noted to have bladder cancer and undergoing current treatment by urology. Scheduled for secondary surgery. Follow-up and additional  treatment per urology. Continue to monitor.        Other   Medicare annual wellness visit, subsequent - Primary    Reviewed and updated patient's medical, surgical, family and social history. Medications and allergies were also reviewed. Basic screenings for depression, activities of daily living, hearing, cognition and safety were performed. Provider list was updated and health plan was provided to the patient.        Routine adult health maintenance    1) Anticipatory Guidance: Discussed importance of wearing a seatbelt while driving and not texting while driving;  changing batteries in smoke detector at least once annually; wearing suntan lotion when outside; eating a balanced and moderate diet; getting physical activity at least 30 minutes per day.  2) Immunizations / Screenings / Labs:  Declines Tetanus and Prevnar today secondary to up coming surgery. Obtain PSA for prostate cancer screening. Obtain hepatitis C antibody for hepatitis C screening. Indicates he had a colonoscopy approximately 4 years ago. Requested results of colonoscopy and follow-up. Consider referral to gastroenterology for new colonoscopy if necessary. All other screenings are up-to-date per recommendations. Obtain CBC, CMET, and lipid profile.    Overall well exam with risk factors for cardiovascular disease including hypertension. He is also currently undergoing treatment for bladder cancer which is managed by urology. Blood pressure appears adequately controlled current medication regimen and no adverse side effects. Encouraged increasing physical activity to goal of 30 minutes of moderate level activity daily. BMI does indicate overweight. Continue other healthy lifestyle behaviors and choices. Follow-up prevention exam in 1 year. Follow-up office visit pending blood work and for chronic conditions.      Relevant Orders   CBC (Completed)   Comprehensive metabolic panel (Completed)   Lipid panel (Completed)   PSA  (Completed)    Other Visit Diagnoses    Other problems related to lifestyle       Relevant Orders   Hepatitis C Antibody       I have discontinued Mr. Beer's phenazopyridine, HYDROcodone-acetaminophen, and diltiazem. I am also having him maintain his ranitidine, lisinopril, and aspirin EC.   Follow-up: Return if symptoms worsen or fail to improve.   Mauricio Po, FNP

## 2017-03-31 LAB — HEPATITIS C ANTIBODY: HCV Ab: REACTIVE — AB

## 2017-04-01 ENCOUNTER — Encounter (HOSPITAL_COMMUNITY): Payer: Self-pay

## 2017-04-01 ENCOUNTER — Encounter (HOSPITAL_COMMUNITY)
Admission: RE | Admit: 2017-04-01 | Discharge: 2017-04-01 | Disposition: A | Discharge status: Home / Self Care | Payer: Medicare Other | Source: Ambulatory Visit | Attending: Urology | Admitting: Urology

## 2017-04-01 DIAGNOSIS — Z01818 Encounter for other preprocedural examination: Secondary | ICD-10-CM | POA: Insufficient documentation

## 2017-04-01 DIAGNOSIS — C679 Malignant neoplasm of bladder, unspecified: Secondary | ICD-10-CM | POA: Insufficient documentation

## 2017-04-01 HISTORY — DX: Malignant (primary) neoplasm, unspecified: C80.1

## 2017-04-01 LAB — HEPATITIS C RNA QUANTITATIVE
HCV Quantitative Log: <1.18 Log IU/mL
HCV Quantitative: <15 IU/mL

## 2017-04-01 NOTE — Progress Notes (Signed)
CBC, CMP 03-30-17 epic

## 2017-04-02 ENCOUNTER — Other Ambulatory Visit (HOSPITAL_COMMUNITY): Payer: Medicare Other

## 2017-04-03 NOTE — H&P (Signed)
Office Visit Report     03/20/2017   --------------------------------------------------------------------------------   Jeremy Patrick  MRN: [id number]  PRIMARY CARE:  Nolene Ebbs, MD  DOB: February 28, 1946, 71 year old Male  REFERRING:  Raynelle Bring, MD  SSN: [id number]  PROVIDER:  Raynelle Bring, M.D.    LOCATION:  [facility] [phone]   --------------------------------------------------------------------------------   CC/HPI: Bladder cancer   Jeremy Patrick returns today in the company of his wife after his recent transurethral resection of his bladder tumor. He has recovered uneventfully. He is having the expected urinary frequency although this is improving as would be expected. He denies any gross hematuria or dysuria.     ALLERGIES: None   MEDICATIONS: Aspirin  Lisinopril  Omeprazole     GU PSH: Cystoscopy - 02/11/2017 Cystoscopy TURBT 2-5 cm - 03/02/2017 Locm 300-'399Mg'$ /Ml Iodine,1Ml - 02/19/2017    NON-GU PSH: None   GU PMH: Bladder, Neoplasm of Unspecified behavior - 02/11/2017 Gross hematuria - 02/11/2017      PMH Notes:   1) Urothelial carcinoma of the bladder: He presented to me in March 2018 with painless gross hematuria. CT imaging was unremarkable except for borderline 8 mm RP lymph nodes and a possible bladder mass. Cystoscopy confirmed a 3.5 cm left lateral bladder tumor near the bladder neck.   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   Tobacco Use Assessment Completed: Used Tobacco in last 30 days? Does not drink anymore.  Drinks 1 caffeinated drink per day.    REVIEW OF SYSTEMS:    GU Review Male:   Patient reports frequent urination.  Patient denies hard to postpone urination, burning/ pain with urination, get up at night to urinate, leakage of urine, stream starts and stops, trouble starting your streams, and have to strain to urinate .  Gastrointestinal (Lower):   Patient denies diarrhea and constipation.  Gastrointestinal (Upper):   Patient denies nausea and vomiting.  Constitutional:   Patient denies fever, night sweats, weight loss, and fatigue.  Skin:   Patient denies skin rash/ lesion and itching.  Eyes:   Patient denies blurred vision and double vision.  Ears/ Nose/ Throat:   Patient denies sinus problems and sore throat.  Hematologic/Lymphatic:   Patient denies swollen glands and easy bruising.  Cardiovascular:   Patient denies leg swelling and chest pains.  Respiratory:   Patient reports cough. Patient denies shortness of breath.  Endocrine:   Patient denies excessive thirst.  Musculoskeletal:   Patient denies back pain and joint pain.  Neurological:   Patient denies headaches and dizziness.  Psychologic:   Patient denies depression and anxiety.   VITAL SIGNS:      03/20/2017 03:07 PM  Weight 213 lb / 96.62 kg  Height 72 in / 182.88 cm  BP 144/90 mmHg  Pulse 65 /min  BMI 28.9 kg/m   MULTI-SYSTEM PHYSICAL EXAMINATION:    Constitutional: Well-nourished. No physical deformities. Normally developed. Good grooming.  Respiratory: No labored breathing, no use of accessory muscles.   Cardiovascular: Normal temperature, normal extremity pulses, no swelling, no varicosities.     PAST DATA REVIEWED:  Source Of History:  Patient  Records Review:   Pathology Reports  Urine Test Review:   Urinalysis   PROCEDURES:          Urinalysis w/Scope Dipstick Dipstick Cont'd Micro  Color: Yellow Bilirubin: Neg WBC/hpf: 6 - 10/hpf  Appearance: Clear Ketones: Neg RBC/hpf: 0 - 2/hpf  Specific Gravity: 1.020 Blood: 2+ Bacteria: Rare (0-9/hpf)  pH: 6.5 Protein: Neg Cystals: NS (Not Seen)  Glucose: Neg Urobilinogen: 0.2  Casts: NS (Not Seen)    Nitrites: Neg Trichomonas: Not Present    Leukocyte Esterase: 2+ Mucous: Not Present      Epithelial Cells: 0 - 5/hpf      Yeast: NS (Not Seen)      Sperm: Not Present    ASSESSMENT:      ICD-10 Details  1 GU:   Bladder Cancer, overlapping sites - C67.8    PLAN:           Orders Labs Urine Culture          Schedule Return Visit/Planned Activity: Other See Visit Notes             Note: Will call to schedule surgery          Document Letter(s):  Created for Patient: Clinical Summary         Notes:   1. Bladder cancer: I had a detailed discussion with Jeremy Patrick and his wife and reviewed his pathology report indicating high-grade, T1 urothelial carcinoma with a small component of small cell differentiation. There is a focal area concerning but not definite for muscularis propria invasion. I explained a very high risk nature of this pathology report and the need for further definitive staging to determine appropriate options. He understands that it is very reasonable to consider radical cystectomy considering his pathology report although he potentially may be a candidate for intravesical therapy and continued endoscopic management as well. He agrees with proceeding with a repeat TUR for restaging. This will be scheduled in the near future. We have reviewed potential risks, complications, and expected recovery process in detail. Informed consent has been obtained.   Cc:    E & M CODE: I spent at least 25 minutes face to face with the patient, more than 50% of that time was spent on counseling and/or coordinating care.     * Signed by Raynelle Bring, M.D. on 03/21/17 at 8:12 PM (EDT)*

## 2017-04-06 ENCOUNTER — Ambulatory Visit (HOSPITAL_COMMUNITY): Payer: Medicare Other | Admitting: Certified Registered Nurse Anesthetist

## 2017-04-06 ENCOUNTER — Ambulatory Visit (HOSPITAL_COMMUNITY)
Admission: RE | Admit: 2017-04-06 | Discharge: 2017-04-06 | Disposition: A | Discharge status: Home / Self Care | Payer: Medicare Other | Source: Ambulatory Visit | Attending: Urology | Admitting: Urology

## 2017-04-06 ENCOUNTER — Encounter (HOSPITAL_COMMUNITY)
Admission: RE | Disposition: A | Discharge status: Home / Self Care | Payer: Self-pay | Source: Ambulatory Visit | Attending: Urology

## 2017-04-06 ENCOUNTER — Encounter (HOSPITAL_COMMUNITY): Payer: Self-pay | Admitting: *Deleted

## 2017-04-06 DIAGNOSIS — C678 Malignant neoplasm of overlapping sites of bladder: Secondary | ICD-10-CM | POA: Diagnosis not present

## 2017-04-06 DIAGNOSIS — Z87891 Personal history of nicotine dependence: Secondary | ICD-10-CM | POA: Diagnosis not present

## 2017-04-06 DIAGNOSIS — C679 Malignant neoplasm of bladder, unspecified: Secondary | ICD-10-CM | POA: Insufficient documentation

## 2017-04-06 DIAGNOSIS — C677 Malignant neoplasm of urachus: Secondary | ICD-10-CM | POA: Diagnosis not present

## 2017-04-06 DIAGNOSIS — Z7982 Long term (current) use of aspirin: Secondary | ICD-10-CM | POA: Insufficient documentation

## 2017-04-06 DIAGNOSIS — K219 Gastro-esophageal reflux disease without esophagitis: Secondary | ICD-10-CM | POA: Insufficient documentation

## 2017-04-06 DIAGNOSIS — I10 Essential (primary) hypertension: Secondary | ICD-10-CM | POA: Insufficient documentation

## 2017-04-06 HISTORY — PX: TRANSURETHRAL RESECTION OF BLADDER TUMOR: SHX2575

## 2017-04-06 HISTORY — PX: CYSTOSCOPY: SHX5120

## 2017-04-06 SURGERY — TURBT (TRANSURETHRAL RESECTION OF BLADDER TUMOR)
Anesthesia: General

## 2017-04-06 MED ORDER — PROPOFOL 10 MG/ML IV BOLUS
INTRAVENOUS | Status: DC | PRN
  Administered 2017-04-06: 200 mg via INTRAVENOUS

## 2017-04-06 MED ORDER — SUGAMMADEX SODIUM 200 MG/2ML IV SOLN
INTRAVENOUS | Status: AC
  Filled 2017-04-06: qty 2

## 2017-04-06 MED ORDER — ONDANSETRON HCL 4 MG/2ML IJ SOLN: 4.0000 mg | Freq: Once | INTRAMUSCULAR | Status: DC | PRN

## 2017-04-06 MED ORDER — FENTANYL CITRATE (PF) 100 MCG/2ML IJ SOLN
25.0000 ug | INTRAMUSCULAR | Status: DC | PRN
  Administered 2017-04-06: 50 ug via INTRAVENOUS

## 2017-04-06 MED ORDER — ACETAMINOPHEN 325 MG PO TABS: 325.0000 mg | ORAL_TABLET | ORAL | Status: DC | PRN

## 2017-04-06 MED ORDER — LIDOCAINE 2% (20 MG/ML) 5 ML SYRINGE
INTRAMUSCULAR | Status: AC
  Filled 2017-04-06: qty 5

## 2017-04-06 MED ORDER — OXYCODONE HCL 5 MG/5ML PO SOLN
5.0000 mg | Freq: Once | ORAL | Status: DC | PRN
  Filled 2017-04-06: qty 5

## 2017-04-06 MED ORDER — ONDANSETRON HCL 4 MG/2ML IJ SOLN
INTRAMUSCULAR | Status: AC
  Filled 2017-04-06: qty 2

## 2017-04-06 MED ORDER — ACETAMINOPHEN 160 MG/5ML PO SOLN: 325.0000 mg | ORAL | Status: DC | PRN

## 2017-04-06 MED ORDER — SODIUM CHLORIDE 0.9 % IR SOLN
Status: DC | PRN
  Administered 2017-04-06: 12000 mL via INTRAVESICAL

## 2017-04-06 MED ORDER — OXYCODONE HCL 5 MG PO TABS: 5.0000 mg | ORAL_TABLET | Freq: Once | ORAL | Status: DC | PRN

## 2017-04-06 MED ORDER — LACTATED RINGERS IV SOLN
INTRAVENOUS | Status: DC
  Administered 2017-04-06 (×2): via INTRAVENOUS

## 2017-04-06 MED ORDER — ROCURONIUM BROMIDE 50 MG/5ML IV SOSY
PREFILLED_SYRINGE | INTRAVENOUS | Status: AC
  Filled 2017-04-06: qty 5

## 2017-04-06 MED ORDER — HYDROCODONE-ACETAMINOPHEN 5-325 MG PO TABS: 1.0000 | ORAL_TABLET | Freq: Four times a day (QID) | ORAL | 0 refills | Status: DC | PRN

## 2017-04-06 MED ORDER — ONDANSETRON HCL 4 MG/2ML IJ SOLN
INTRAMUSCULAR | Status: DC | PRN
  Administered 2017-04-06: 4 mg via INTRAVENOUS

## 2017-04-06 MED ORDER — MIDAZOLAM HCL 2 MG/2ML IJ SOLN
INTRAMUSCULAR | Status: AC
  Filled 2017-04-06: qty 2

## 2017-04-06 MED ORDER — EPHEDRINE SULFATE 50 MG/ML IJ SOLN
INTRAMUSCULAR | Status: DC | PRN
  Administered 2017-04-06: 5 mg via INTRAVENOUS

## 2017-04-06 MED ORDER — SUGAMMADEX SODIUM 200 MG/2ML IV SOLN
INTRAVENOUS | Status: DC | PRN
  Administered 2017-04-06: 200 mg via INTRAVENOUS

## 2017-04-06 MED ORDER — ROCURONIUM BROMIDE 50 MG/5ML IV SOSY
PREFILLED_SYRINGE | INTRAVENOUS | Status: DC | PRN
  Administered 2017-04-06: 10 mg via INTRAVENOUS
  Administered 2017-04-06: 35 mg via INTRAVENOUS

## 2017-04-06 MED ORDER — PROPOFOL 10 MG/ML IV BOLUS
INTRAVENOUS | Status: AC
  Filled 2017-04-06: qty 20

## 2017-04-06 MED ORDER — PHENAZOPYRIDINE HCL 100 MG PO TABS: 100.0000 mg | ORAL_TABLET | Freq: Three times a day (TID) | ORAL | 0 refills | Status: DC | PRN

## 2017-04-06 MED ORDER — EPHEDRINE 5 MG/ML INJ
INTRAVENOUS | Status: AC
  Filled 2017-04-06: qty 10

## 2017-04-06 MED ORDER — SUCCINYLCHOLINE CHLORIDE 200 MG/10ML IV SOSY
PREFILLED_SYRINGE | INTRAVENOUS | Status: AC
  Filled 2017-04-06: qty 10

## 2017-04-06 MED ORDER — FLUORESCEIN SODIUM 10 % IV SOLN
INTRAVENOUS | Status: DC | PRN
  Administered 2017-04-06: 25 mg via INTRAVENOUS

## 2017-04-06 MED ORDER — SUCCINYLCHOLINE CHLORIDE 200 MG/10ML IV SOSY
PREFILLED_SYRINGE | INTRAVENOUS | Status: DC | PRN
  Administered 2017-04-06: 100 mg via INTRAVENOUS

## 2017-04-06 MED ORDER — FLUORESCEIN SODIUM 10 % IV SOLN
INTRAVENOUS | Status: AC
  Filled 2017-04-06: qty 5

## 2017-04-06 MED ORDER — FENTANYL CITRATE (PF) 100 MCG/2ML IJ SOLN
INTRAMUSCULAR | Status: AC
  Filled 2017-04-06: qty 2

## 2017-04-06 MED ORDER — CEFAZOLIN SODIUM-DEXTROSE 2-4 GM/100ML-% IV SOLN
2.0000 g | INTRAVENOUS | Status: AC
  Administered 2017-04-06: 2 g via INTRAVENOUS
  Filled 2017-04-06: qty 100

## 2017-04-06 MED ORDER — FENTANYL CITRATE (PF) 250 MCG/5ML IJ SOLN
INTRAMUSCULAR | Status: AC
  Filled 2017-04-06: qty 5

## 2017-04-06 MED ORDER — KETOROLAC TROMETHAMINE 30 MG/ML IJ SOLN: 30.0000 mg | Freq: Once | INTRAMUSCULAR | Status: DC | PRN

## 2017-04-06 MED ORDER — LIDOCAINE 2% (20 MG/ML) 5 ML SYRINGE
INTRAMUSCULAR | Status: DC | PRN
  Administered 2017-04-06: 100 mg via INTRAVENOUS

## 2017-04-06 MED ORDER — FENTANYL CITRATE (PF) 100 MCG/2ML IJ SOLN
INTRAMUSCULAR | Status: DC | PRN
  Administered 2017-04-06 (×5): 50 ug via INTRAVENOUS

## 2017-04-06 MED ORDER — MEPERIDINE HCL 50 MG/ML IJ SOLN: 6.2500 mg | INTRAMUSCULAR | Status: DC | PRN

## 2017-04-06 SURGICAL SUPPLY — 15 items
BAG URINE DRAINAGE (UROLOGICAL SUPPLIES) IMPLANT
BAG URO CATCHER STRL LF (MISCELLANEOUS) ×2 IMPLANT
CATH INTERMIT  6FR 70CM (CATHETERS) IMPLANT
CLOTH BEACON ORANGE TIMEOUT ST (SAFETY) ×2 IMPLANT
COVER SURGICAL LIGHT HANDLE (MISCELLANEOUS) IMPLANT
ELECT REM PT RETURN 15FT ADLT (MISCELLANEOUS) ×2 IMPLANT
GLOVE BIOGEL M STRL SZ7.5 (GLOVE) ×2 IMPLANT
GOWN STRL REUS W/TWL LRG LVL3 (GOWN DISPOSABLE) ×4 IMPLANT
GUIDEWIRE STR DUAL SENSOR (WIRE) IMPLANT
LOOP CUT BIPOLAR 24F LRG (ELECTROSURGICAL) ×2 IMPLANT
MANIFOLD NEPTUNE II (INSTRUMENTS) ×2 IMPLANT
PACK CYSTO (CUSTOM PROCEDURE TRAY) ×2 IMPLANT
SET ASPIRATION TUBING (TUBING) IMPLANT
SYRINGE IRR TOOMEY STRL 70CC (SYRINGE) IMPLANT
TUBING CONNECTING 10 (TUBING) ×2 IMPLANT

## 2017-04-06 NOTE — Progress Notes (Signed)
Report off to Ivor Costa, Therapist, sports.  Pt and wife, Jeremy Patrick, have a driver arriving after 5pm to drive them home.  Pt and wife verbalize understanding of post general anesthesia/ procedural discharge instructions.  Pt voided and ambulated in hallway, tolerated snack well.

## 2017-04-06 NOTE — Progress Notes (Signed)
[name] states she does not drive, they do not have anyone to come get them, all family/friends around are working.  I informed him no driving for 35CYE after surgery.  I informed him of the risks of driving withing 18HTM of receiving anesthesia.  Pt verbalized understanding and then stated " I will staty until I am released to drive".

## 2017-04-06 NOTE — Anesthesia Procedure Notes (Signed)
Procedure Name: Intubation Date/Time: 04/06/2017 11:54 AM Performed by: Maxwell Caul Pre-anesthesia Checklist: Patient identified, Emergency Drugs available, Suction available and Patient being monitored Patient Re-evaluated:Patient Re-evaluated prior to inductionOxygen Delivery Method: Circle system utilized Preoxygenation: Pre-oxygenation with 100% oxygen Intubation Type: IV induction Ventilation: Mask ventilation without difficulty Laryngoscope Size: Mac and 4 Grade View: Grade I Tube type: Oral Tube size: 7.5 mm Number of attempts: 1 Airway Equipment and Method: Stylet Placement Confirmation: ETT inserted through vocal cords under direct vision,  positive ETCO2 and breath sounds checked- equal and bilateral Secured at: 21 cm Tube secured with: Tape Dental Injury: Teeth and Oropharynx as per pre-operative assessment

## 2017-04-06 NOTE — Anesthesia Postprocedure Evaluation (Signed)
Anesthesia Post Note  Patient: Jeremy Patrick  Procedure(s) Performed: Procedure(s) (LRB): TRANSURETHRAL RESECTION OF BLADDER TUMOR (TURBT) (N/A) CYSTOSCOPY (N/A)  Patient location during evaluation: PACU Anesthesia Type: General Level of consciousness: awake and alert, oriented and patient cooperative Pain management: pain level controlled Vital Signs Assessment: post-procedure vital signs reviewed and stable Respiratory status: spontaneous breathing, nonlabored ventilation and respiratory function stable Cardiovascular status: blood pressure returned to baseline and stable Postop Assessment: no signs of nausea or vomiting Anesthetic complications: no       Last Vitals:  Vitals:   04/06/17 1345 04/06/17 1400  BP: 121/81 118/73  Pulse: (!) 51 (!) 56  Resp:  (!) 9  Temp:  36.3 C    Last Pain:  Vitals:   04/06/17 1400  TempSrc:   PainSc: 1                  Royden Bulman,E. Darshawn Boateng

## 2017-04-06 NOTE — Interval H&P Note (Signed)
History and Physical Interval Note:  04/06/2017 11:25 AM  Jeremy Patrick  has presented today for surgery, with the diagnosis of BLADDER CANCER  The various methods of treatment have been discussed with the patient and family. After consideration of risks, benefits and other options for treatment, the patient has consented to  Procedure(s): TRANSURETHRAL RESECTION OF BLADDER TUMOR (TURBT) (N/A) CYSTOSCOPY (N/A) as a surgical intervention .  The patient's history has been reviewed, patient examined, no change in status, stable for surgery.  I have reviewed the patient's chart and labs.  Questions were answered to the patient's satisfaction.     Sherwin Hollingshed,LES

## 2017-04-06 NOTE — Transfer of Care (Signed)
Immediate Anesthesia Transfer of Care Note  Patient: Jeremy Patrick  Procedure(s) Performed: Procedure(s): TRANSURETHRAL RESECTION OF BLADDER TUMOR (TURBT) (N/A) CYSTOSCOPY (N/A)  Patient Location: PACU  Anesthesia Type:General  Level of Consciousness:  sedated, patient cooperative and responds to stimulation  Airway & Oxygen Therapy:Patient Spontanous Breathing and Patient connected to face mask oxgen  Post-op Assessment:  Report given to PACU RN and Post -op Vital signs reviewed and stable  Post vital signs:  Reviewed and stable  Last Vitals:  Vitals:   04/06/17 0907  BP: (!) 169/91  Pulse: (!) 57  Resp: 18  Temp: 44.9 C    Complications: No apparent anesthesia complications

## 2017-04-06 NOTE — Discharge Instructions (Addendum)
1. You may see some blood in the urine and may have some burning with urination for 48-72 hours. You also may notice that you have to urinate more frequently or urgently after your procedure which is normal.  2. You should call should you develop an inability urinate, fever > 101, persistent nausea and vomiting that prevents you from eating or drinking to stay hydrated.    General Anesthesia, Adult, Care After These instructions provide you with information about caring for yourself after your procedure. Your health care provider may also give you more specific instructions. Your treatment has been planned according to current medical practices, but problems sometimes occur. Call your health care provider if you have any problems or questions after your procedure. What can I expect after the procedure? After the procedure, it is common to have:  Vomiting.  A sore throat.  Mental slowness. It is common to feel:  Nauseous.  Cold or shivery.  Sleepy.  Tired.  Sore or achy, even in parts of your body where you did not have surgery. Follow these instructions at home: For at least 24 hours after the procedure:   Do not:  Participate in activities where you could fall or become injured.  Drive.  Use heavy machinery.  Drink alcohol.  Take sleeping pills or medicines that cause drowsiness.  Make important decisions or sign legal documents.  Take care of children on your own.  Rest. Eating and drinking   If you vomit, drink water, juice, or soup when you can drink without vomiting.  Drink enough fluid to keep your urine clear or pale yellow.  Make sure you have little or no nausea before eating solid foods.  Follow the diet recommended by your health care provider. General instructions   Have a responsible adult stay with you until you are awake and alert.  Return to your normal activities as told by your health care provider. Ask your health care provider what  activities are safe for you.  Take over-the-counter and prescription medicines only as told by your health care provider.  If you smoke, do not smoke without supervision.  Keep all follow-up visits as told by your health care provider. This is important. Contact a health care provider if:  You continue to have nausea or vomiting at home, and medicines are not helpful.  You cannot drink fluids or start eating again.  You cannot urinate after 8-12 hours.  You develop a skin rash.  You have fever.  You have increasing redness at the site of your procedure. Get help right away if:  You have difficulty breathing.  You have chest pain.  You have unexpected bleeding.  You feel that you are having a life-threatening or urgent problem. This information is not intended to replace advice given to you by your health care provider. Make sure you discuss any questions you have with your health care provider. Document Released: 03/02/2001 Document Revised: 04/28/2016 Document Reviewed: 11/08/2015 Elsevier Interactive Patient Education  2017 [employer].

## 2017-04-06 NOTE — Anesthesia Preprocedure Evaluation (Signed)
Anesthesia Evaluation  Patient identified by MRN, date of birth, ID band Patient awake    Reviewed: Allergy & Precautions, NPO status , Patient's Chart, lab work & pertinent test results  Airway Mallampati: II  TM Distance: >3 FB Neck ROM: Full    Dental  (+) Poor Dentition, Missing, Chipped   Pulmonary former smoker,    Pulmonary exam normal breath sounds clear to auscultation       Cardiovascular hypertension, Pt. on medications Normal cardiovascular exam Rhythm:Regular Rate:Normal     Neuro/Psych negative neurological ROS  negative psych ROS   GI/Hepatic Neg liver ROS, GERD  Medicated and Controlled,  Endo/Other  negative endocrine ROS  Renal/GU negative Renal ROS  negative genitourinary   Musculoskeletal negative musculoskeletal ROS (+)   Abdominal Normal abdominal exam  (+)   Peds  Hematology negative hematology ROS (+)   Anesthesia Other Findings   Reproductive/Obstetrics                             Anesthesia Physical  Anesthesia Plan  ASA: II  Anesthesia Plan: General   Post-op Pain Management:    Induction: Intravenous  Airway Management Planned: Oral ETT  Additional Equipment:   Intra-op Plan:   Post-operative Plan: Extubation in OR  Informed Consent: I have reviewed the patients History and Physical, chart, labs and discussed the procedure including the risks, benefits and alternatives for the proposed anesthesia with the patient or authorized representative who has indicated his/her understanding and acceptance.   Dental advisory given  Plan Discussed with: CRNA and Surgeon  Anesthesia Plan Comments:         Anesthesia Quick Evaluation

## 2017-04-06 NOTE — Op Note (Signed)
Preoperative diagnosis: Bladder cancer  Postoperative diagnosis: Bladder cancer  Procedures: 1.  Cystoscopy 2.  Repeat transurethral resection of bladder tumor  Surgeon: Pryor Curia. M.D.  Anesthesia: General  Complications: None  Specimen: Repeat resection of left bladder neck tumor  Disposition of specimens: To pathology  Indication: Jeremy Patrick is a 71 year old gentleman who recently underwent resection of a bladder tumor that was found to be high-grade, T1 urothelial carcinoma.  He presents today for repeat resection for further staging evaluation.  The potential risks, complications, and alternative options as well as the expected recovery process has been discussed.  He gives informed consent to proceed.  Description of procedure: He was taken to the operating room and a general anesthetic was administered.  He was paralyzed.  He was given preoperative antibiotics, placed in the dorsal lithotomy position, and prepped and draped in the usual sterile fashion.  Next, a preoperative timeout was performed.  Cystourethroscopy was performed with a 30 and 70 lens.  This revealed a necrotic and healing previous resection site along with surrounding edema.  This was located along the left bladder neck in an area measuring approximately 3-1/2 cm.  No other new bladder tumors or other abnormalities were noted.  I then placed a 26 French resectoscope sheath.  Using loop bipolar cutting resection, I resected the previously noted bladder tumor site including underlying detrusor muscle.  I had difficulty identifying the left ureteral orifice.  I administered fluoroscein and confirmed that the left ureteral orifice was effluxing and had not been resected with the bladder tumor resection.  Hemostasis was then achieved with bipolar cautery.  The patient's bladder was emptied and reinspected.  All bladder tumor had been removed and was sent for permanent pathologic analysis.  Hemostasis appeared  excellent.  He was able to be transferred to the recovery unit in satisfactory condition.

## 2017-04-07 ENCOUNTER — Encounter (HOSPITAL_COMMUNITY): Payer: Self-pay | Admitting: Urology

## 2017-04-13 ENCOUNTER — Telehealth: Payer: Self-pay | Admitting: Family

## 2017-04-13 ENCOUNTER — Other Ambulatory Visit: Payer: Self-pay

## 2017-04-13 MED ORDER — ROSUVASTATIN CALCIUM 10 MG PO TABS: 10.0000 mg | ORAL_TABLET | Freq: Every day | ORAL | 2 refills | Status: DC

## 2017-04-13 NOTE — Telephone Encounter (Signed)
Pt aware of results 

## 2017-04-13 NOTE — Telephone Encounter (Signed)
Pt would like a call back regarding his results

## 2017-04-23 ENCOUNTER — Ambulatory Visit (INDEPENDENT_AMBULATORY_CARE_PROVIDER_SITE_OTHER): Payer: Medicare Other | Admitting: Family

## 2017-04-23 ENCOUNTER — Encounter: Payer: Self-pay | Admitting: Family

## 2017-04-23 DIAGNOSIS — R768 Other specified abnormal immunological findings in serum: Secondary | ICD-10-CM | POA: Insufficient documentation

## 2017-04-23 NOTE — Assessment & Plan Note (Signed)
Hepatitis C positive with RNA count negative. Discussed and answered patients questions regarding tests and results. No further treatment is necessary at time for Hepatitis C. Continue to monitor and follow up as needed.

## 2017-04-23 NOTE — Progress Notes (Signed)
   Subjective:    Patient ID: Jeremy Patrick, male    DOB: Jun 06, 1946, 71 y.o.   MRN: [id number]  Chief Complaint  Patient presents with  . Follow-up    has some questions about lab results    HPI:  Jeremy Patrick is a 71 y.o. male who  has a past medical history of Cancer ([place]); GERD (gastroesophageal reflux disease); and Hypertension. and presents today for a follow up office visit.   Recently tested positive for Hepatitis C and had concern for the test results. Describes how he tested positive in the past and was treated.   No Known Allergies    Outpatient Medications Prior to Visit  Medication Sig Dispense Refill  . HYDROcodone-acetaminophen (NORCO/VICODIN) 5-325 MG tablet Take 1-2 tablets by mouth every 6 (six) hours as needed. 10 tablet 0  . lisinopril (PRINIVIL,ZESTRIL) 40 MG tablet Take 1 tablet (40 mg total) by mouth daily. 90 tablet 1  . phenazopyridine (PYRIDIUM) 100 MG tablet Take 1 tablet (100 mg total) by mouth 3 (three) times daily as needed for pain (for burning). 20 tablet 0  . ranitidine (ZANTAC) 300 MG tablet Take 300 mg by mouth daily.    . rosuvastatin (CRESTOR) 10 MG tablet Take 1 tablet (10 mg total) by mouth daily. 30 tablet 2   No facility-administered medications prior to visit.      Review of Systems  Constitutional: Negative for chills and fever.  Respiratory: Negative for chest tightness and shortness of breath.   Cardiovascular: Negative for chest pain, palpitations and leg swelling.  Gastrointestinal: Negative for abdominal distention, abdominal pain, anal bleeding, blood in stool, constipation, diarrhea, nausea, rectal pain and vomiting.      Objective:    BP (!) 156/82 (BP Location: Left Arm, Patient Position: Sitting, Cuff Size: Large)   Pulse (!) 56   Temp 98.2 F (36.8 C) (Oral)   Resp 18   Ht 6' (1.829 m)   Wt 208 lb 6.4 oz (94.5 kg)   SpO2 96%   BMI 28.26 kg/m  Nursing note and vital signs reviewed.  Physical Exam    Constitutional: He is oriented to person, place, and time. He appears well-developed and well-nourished. No distress.  Cardiovascular: Normal rate, regular rhythm, normal heart sounds and intact distal pulses.   Pulmonary/Chest: Effort normal and breath sounds normal.  Neurological: He is alert and oriented to person, place, and time.  Skin: Skin is warm and dry.  Psychiatric: He has a normal mood and affect. His behavior is normal. Judgment and thought content normal.       Assessment & Plan:   Problem List Items Addressed This Visit    None          I am having Mr. Montalvo maintain his ranitidine, lisinopril, phenazopyridine, HYDROcodone-acetaminophen, and rosuvastatin.   No orders of the defined types were placed in this encounter.    Follow-up: No Follow-up on file.  Mauricio Po, FNP

## 2017-04-23 NOTE — Patient Instructions (Signed)
Thank you for choosing [facility].  SUMMARY AND INSTRUCTIONS:  Please continue to take your medications as prescribed.   Follow up:  If your symptoms worsen or fail to improve, please contact our office for further instruction, or in case of emergency go directly to the emergency room at the closest medical facility.

## 2017-05-12 ENCOUNTER — Ambulatory Visit: Payer: Self-pay | Admitting: Family

## 2017-07-04 ENCOUNTER — Other Ambulatory Visit: Payer: Self-pay | Admitting: Family

## 2017-09-04 ENCOUNTER — Other Ambulatory Visit: Payer: Self-pay | Admitting: Family

## 2017-09-21 ENCOUNTER — Other Ambulatory Visit: Payer: Self-pay | Admitting: Urology

## 2017-10-02 NOTE — Progress Notes (Signed)
02-27-17 (EPIC) EKG, CXR

## 2017-10-02 NOTE — Patient Instructions (Addendum)
Jeremy Patrick  10/02/2017   Your procedure is scheduled on: 10-08-17   Report to [facility] Main  Entrance Take [place]  Elevators to 3rd floor to  [place] at 10:30 AM.    Call this number if you have problems the morning of surgery [phone]    Remember: ONLY 1 PERSON MAY GO WITH YOU TO SHORT STAY TO GET  READY MORNING OF [place].  Do not eat food or drink liquids :After Midnight.     Take these medicines the morning of surgery with A SIP OF WATER: Diltiazem (Cardizem) and Ranitidine (Zantac)                                You may not have any metal on your body including hair pins and              piercings  Do not wear jewelry, lotions, powders or perfumes, deodorant             Men may shave face and neck.   Do not bring valuables to the hospital. [place].  Contacts, dentures or bridgework may not be worn into surgery.      Patients discharged the day of surgery will not be allowed to drive home.  Name and phone number of your driver: Jeremy Patrick [phone]               Please read over the following fact sheets you were given: _____________________________________________________________________             [facility] - Preparing for Surgery Before surgery, you can play an important role.  Because skin is not sterile, your skin needs to be as free of germs as possible.  You can reduce the number of germs on your skin by washing with CHG (chlorahexidine gluconate) soap before surgery.  CHG is an antiseptic cleaner which kills germs and bonds with the skin to continue killing germs even after washing. Please DO NOT use if you have an allergy to CHG or antibacterial soaps.  If your skin becomes reddened/irritated stop using the CHG and inform your nurse when you arrive at Short Stay. Do not shave (including legs and underarms) for at least 48 hours prior to the first CHG  shower.  You may shave your face/neck. Please follow these instructions carefully:  1.  Shower with CHG Soap the night before surgery and the  morning of Surgery.  2.  If you choose to wash your hair, wash your hair first as usual with your  normal  shampoo.  3.  After you shampoo, rinse your hair and body thoroughly to remove the  shampoo.                           4.  Use CHG as you would any other liquid soap.  You can apply chg directly  to the skin and wash                       Gently with a scrungie or clean washcloth.  5.  Apply the CHG Soap to your body ONLY FROM THE NECK DOWN.  Do not use on face/ open                           Wound or open sores. Avoid contact with eyes, ears mouth and genitals (private parts).                       Wash face,  Genitals (private parts) with your normal soap.             6.  Wash thoroughly, paying special attention to the area where your surgery  will be performed.  7.  Thoroughly rinse your body with warm water from the neck down.  8.  DO NOT shower/wash with your normal soap after using and rinsing off  the CHG Soap.                9.  Pat yourself dry with a clean towel.            10.  Wear clean pajamas.            11.  Place clean sheets on your bed the night of your first shower and do not  sleep with pets. Day of Surgery : Do not apply any lotions/deodorants the morning of surgery.  Please wear clean clothes to the hospital/surgery center.  FAILURE TO FOLLOW THESE INSTRUCTIONS MAY RESULT IN THE CANCELLATION OF YOUR SURGERY PATIENT SIGNATURE_________________________________  NURSE SIGNATURE__________________________________  ________________________________________________________________________

## 2017-10-05 ENCOUNTER — Encounter (HOSPITAL_COMMUNITY): Payer: Self-pay

## 2017-10-05 ENCOUNTER — Encounter (HOSPITAL_COMMUNITY)
Admission: RE | Admit: 2017-10-05 | Discharge: 2017-10-05 | Disposition: A | Discharge status: Home / Self Care | Payer: Medicare Other | Source: Ambulatory Visit | Attending: Urology | Admitting: Urology

## 2017-10-05 DIAGNOSIS — Z7982 Long term (current) use of aspirin: Secondary | ICD-10-CM | POA: Diagnosis not present

## 2017-10-05 DIAGNOSIS — C670 Malignant neoplasm of trigone of bladder: Secondary | ICD-10-CM | POA: Diagnosis not present

## 2017-10-05 DIAGNOSIS — I10 Essential (primary) hypertension: Secondary | ICD-10-CM | POA: Diagnosis not present

## 2017-10-05 DIAGNOSIS — C675 Malignant neoplasm of bladder neck: Secondary | ICD-10-CM | POA: Diagnosis not present

## 2017-10-05 DIAGNOSIS — K219 Gastro-esophageal reflux disease without esophagitis: Secondary | ICD-10-CM | POA: Diagnosis not present

## 2017-10-05 DIAGNOSIS — C679 Malignant neoplasm of bladder, unspecified: Secondary | ICD-10-CM | POA: Diagnosis present

## 2017-10-05 DIAGNOSIS — Z79899 Other long term (current) drug therapy: Secondary | ICD-10-CM | POA: Diagnosis not present

## 2017-10-05 DIAGNOSIS — Z87891 Personal history of nicotine dependence: Secondary | ICD-10-CM | POA: Diagnosis not present

## 2017-10-05 DIAGNOSIS — N323 Diverticulum of bladder: Secondary | ICD-10-CM | POA: Diagnosis not present

## 2017-10-05 DIAGNOSIS — Z9221 Personal history of antineoplastic chemotherapy: Secondary | ICD-10-CM | POA: Diagnosis not present

## 2017-10-05 LAB — CBC
HCT: 40 % (ref 39.0–52.0)
Hemoglobin: 13.3 g/dL (ref 13.0–17.0)
MCH: 28.2 pg (ref 26.0–34.0)
MCHC: 33.3 g/dL (ref 30.0–36.0)
MCV: 84.9 fL (ref 78.0–100.0)
Platelets: 225 10*3/uL (ref 150–400)
RBC: 4.71 MIL/uL (ref 4.22–5.81)
RDW: 15 % (ref 11.5–15.5)
WBC: 6.3 10*3/uL (ref 4.0–10.5)

## 2017-10-05 LAB — BASIC METABOLIC PANEL
Anion gap: 10 (ref 5–15)
BUN: 17 mg/dL (ref 6–20)
CO2: 29 mmol/L (ref 22–32)
Calcium: 9.3 mg/dL (ref 8.9–10.3)
Chloride: 103 mmol/L (ref 101–111)
Creatinine, Ser: 1.43 mg/dL — ABNORMAL HIGH (ref 0.61–1.24)
GFR calc Af Amer: 56 mL/min — ABNORMAL LOW (ref >60)
GFR calc non Af Amer: 48 mL/min — ABNORMAL LOW (ref >60)
Glucose, Bld: 89 mg/dL (ref 65–99)
Potassium: 4.3 mmol/L (ref 3.5–5.1)
Sodium: 142 mmol/L (ref 135–145)

## 2017-10-07 NOTE — H&P (Signed)
CC/HPI: High-grade, T1 urothelial carcinoma of the bladder   Jeremy Patrick returns today for cystoscopic surveillance after completion of a 6 week course of induction BCG intravesically. He tolerated therapy quite well and denied any significant side effects or problems with treatment. Currently, he feels that he is voiding similarly to his baseline. He denies any recent gross hematuria.     ALLERGIES: None   MEDICATIONS: Lisinopril 40 mg tablet  Diltiazem 24Hr Er 360 mg capsule, extended release 24hr  Low Dose Aspirin Ec 81 mg tablet, delayed release  Ranitidine Hcl 300 mg capsule  Rosuvastatin Calcium 10 mg tablet     GU PSH: Bladder Instill AntiCA Agent - 06/11/2017, 06/03/2017, 05/27/2017, 05/20/2017, 05/13/2017, 05/06/2017 Cystoscopy - 02/11/2017 Cystoscopy TURBT 2-5 cm - 04/06/2017, 03/02/2017 Locm 300-399Mg /Ml Iodine,1Ml - 02/19/2017    NON-GU PSH: None   GU PMH: Bladder Cancer overlapping sites - 03/20/2017 Bladder, Neoplasm of Unspecified behavior - 02/11/2017 Gross hematuria - 02/11/2017      PMH Notes:   1) Urothelial carcinoma of the bladder: He presented to me in March 2018 with painless gross hematuria. CT imaging was unremarkable except for borderline 8 mm RP lymph nodes and a possible bladder mass. Cystoscopy confirmed a 3.5 cm left lateral bladder tumor near the bladder neck.   Mar 2018: TUR - High grade, T1 urothelial carcinoma with 10% glandular differentiation and 5% small cell differentiation (questionable but not definite focal muscularis propria invasion)  Apr 2018: Repeat TUR - Benign  May-Jul 2018: 6 week induction BCG   NON-GU PMH: Encounter for antineoplastic chemotherapy - 05/06/2017 GERD Hepatitis C Hypertension    FAMILY HISTORY: 3 Son's - Runs in Family    Notes: 2 daughters   SOCIAL HISTORY: Marital Status: Married Preferred Language: English; Ethnicity: Not Hispanic Or Latino; Race: Black or African American Current Smoking Status: Patient has never  smoked.   Tobacco Use Assessment Completed: Used Tobacco in last 30 days? Does not drink anymore.  Drinks 1 caffeinated drink per day.    REVIEW OF SYSTEMS:    GU Review Male:   Patient denies frequent urination, hard to postpone urination, burning/ pain with urination, get up at night to urinate, leakage of urine, stream starts and stops, trouble starting your streams, and have to strain to urinate .  Gastrointestinal (Lower):   Patient denies diarrhea and constipation.  Gastrointestinal (Upper):   Patient denies nausea and vomiting.  Constitutional:   Patient denies fever, night sweats, weight loss, and fatigue.  Skin:   Patient denies skin rash/ lesion and itching.  Eyes:   Patient denies blurred vision and double vision.  Ears/ Nose/ Throat:   Patient denies sore throat and sinus problems.  Hematologic/Lymphatic:   Patient denies swollen glands and easy bruising.  Cardiovascular:   Patient denies leg swelling and chest pains.  Respiratory:   Patient denies cough and shortness of breath.  Endocrine:   Patient denies excessive thirst.  Musculoskeletal:   Patient denies back pain and joint pain.  Neurological:   Patient denies headaches and dizziness.  Psychologic:   Patient denies depression and anxiety.     GU PHYSICAL EXAMINATION:    Urethral Meatus: Normal size. No lesion, no wart, no discharge, no polyp. Normal location.   MULTI-SYSTEM PHYSICAL EXAMINATION:    Constitutional: Well-nourished. No physical deformities. Normally developed. Good grooming.     PAST DATA REVIEWED:  Source Of History:  Patient  Records Review:   Previous Patient Records  Urine  Test Review:   Urinalysis          Urinalysis w/Scope Dipstick Dipstick Cont'd Micro  Color: Yellow Bilirubin: Neg WBC/hpf: 10 - 20/hpf  Appearance: Clear Ketones: Neg RBC/hpf: 0 - 2/hpf  Specific Gravity: 1.025 Blood: Neg Bacteria: Rare (0-9/hpf)  pH: 6.0 Protein: 1+ Cystals: NS (Not Seen)  Glucose: Neg Urobilinogen:  0.2 Casts: Hyaline    Nitrites: Neg Trichomonas: Not Present    Leukocyte Esterase: Neg Mucous: Present      Epithelial Cells: 0 - 5/hpf      Yeast: NS (Not Seen)      Sperm: Not Present    ASSESSMENT:      ICD-10 Details  1 GU:   Bladder Cancer overlapping sites - C67.8    PLAN:           Notes:   1. High risk non-muscle invasive bladder cancer: His cystology was concerning for recurrence and he follows up today for repeat TUR. I discussed the potential benefits and risks of the procedure, side effects of the proposed treatment, the likelihood of the patient achieving the goals of the procedure, and any potential problems that might occur during the procedure or recuperation.

## 2017-10-08 ENCOUNTER — Encounter (HOSPITAL_COMMUNITY): Payer: Self-pay | Admitting: *Deleted

## 2017-10-08 ENCOUNTER — Ambulatory Visit (HOSPITAL_COMMUNITY): Payer: Medicare Other | Admitting: Anesthesiology

## 2017-10-08 ENCOUNTER — Encounter (HOSPITAL_COMMUNITY)
Admission: RE | Disposition: A | Discharge status: Home / Self Care | Payer: Self-pay | Source: Ambulatory Visit | Attending: Urology

## 2017-10-08 ENCOUNTER — Ambulatory Visit (HOSPITAL_COMMUNITY)
Admission: RE | Admit: 2017-10-08 | Discharge: 2017-10-08 | Disposition: A | Discharge status: Home / Self Care | Payer: Medicare Other | Source: Ambulatory Visit | Attending: Urology | Admitting: Urology

## 2017-10-08 ENCOUNTER — Ambulatory Visit (HOSPITAL_COMMUNITY): Payer: Medicare Other

## 2017-10-08 DIAGNOSIS — C675 Malignant neoplasm of bladder neck: Secondary | ICD-10-CM | POA: Insufficient documentation

## 2017-10-08 DIAGNOSIS — Z79899 Other long term (current) drug therapy: Secondary | ICD-10-CM | POA: Insufficient documentation

## 2017-10-08 DIAGNOSIS — N323 Diverticulum of bladder: Secondary | ICD-10-CM | POA: Diagnosis not present

## 2017-10-08 DIAGNOSIS — Z87891 Personal history of nicotine dependence: Secondary | ICD-10-CM | POA: Insufficient documentation

## 2017-10-08 DIAGNOSIS — C670 Malignant neoplasm of trigone of bladder: Secondary | ICD-10-CM | POA: Diagnosis not present

## 2017-10-08 DIAGNOSIS — K219 Gastro-esophageal reflux disease without esophagitis: Secondary | ICD-10-CM | POA: Insufficient documentation

## 2017-10-08 DIAGNOSIS — I10 Essential (primary) hypertension: Secondary | ICD-10-CM | POA: Insufficient documentation

## 2017-10-08 DIAGNOSIS — Z9221 Personal history of antineoplastic chemotherapy: Secondary | ICD-10-CM | POA: Insufficient documentation

## 2017-10-08 DIAGNOSIS — Z7982 Long term (current) use of aspirin: Secondary | ICD-10-CM | POA: Diagnosis not present

## 2017-10-08 HISTORY — PX: TRANSURETHRAL RESECTION OF BLADDER TUMOR: SHX2575

## 2017-10-08 HISTORY — PX: CYSTOSCOPY W/ URETERAL STENT PLACEMENT: SHX1429

## 2017-10-08 SURGERY — TURBT (TRANSURETHRAL RESECTION OF BLADDER TUMOR)
Anesthesia: General

## 2017-10-08 MED ORDER — LACTATED RINGERS IV SOLN
INTRAVENOUS | Status: DC
  Administered 2017-10-08 (×3): via INTRAVENOUS

## 2017-10-08 MED ORDER — INDIGOTINDISULFONATE SODIUM 8 MG/ML IJ SOLN
INTRAMUSCULAR | Status: DC | PRN
  Administered 2017-10-08: 5 mL via INTRAVENOUS

## 2017-10-08 MED ORDER — FENTANYL CITRATE (PF) 100 MCG/2ML IJ SOLN
INTRAMUSCULAR | Status: DC | PRN
  Administered 2017-10-08: 25 ug via INTRAVENOUS
  Administered 2017-10-08: 50 ug via INTRAVENOUS
  Administered 2017-10-08: 25 ug via INTRAVENOUS

## 2017-10-08 MED ORDER — IOHEXOL 300 MG/ML  SOLN
INTRAMUSCULAR | Status: DC | PRN
  Administered 2017-10-08: 17 mL via URETHRAL

## 2017-10-08 MED ORDER — CEFAZOLIN SODIUM-DEXTROSE 2-4 GM/100ML-% IV SOLN
2.0000 g | Freq: Once | INTRAVENOUS | Status: AC
  Administered 2017-10-08: 2 g via INTRAVENOUS
  Filled 2017-10-08: qty 100

## 2017-10-08 MED ORDER — ONDANSETRON HCL 4 MG/2ML IJ SOLN
INTRAMUSCULAR | Status: DC | PRN
  Administered 2017-10-08: 4 mg via INTRAVENOUS

## 2017-10-08 MED ORDER — SODIUM CHLORIDE 0.9 % IR SOLN
Status: DC | PRN
  Administered 2017-10-08: 3000 mL via INTRAVESICAL

## 2017-10-08 MED ORDER — INDIGOTINDISULFONATE SODIUM 8 MG/ML IJ SOLN
INTRAMUSCULAR | Status: AC
  Filled 2017-10-08: qty 5

## 2017-10-08 MED ORDER — DEXAMETHASONE SODIUM PHOSPHATE 10 MG/ML IJ SOLN
INTRAMUSCULAR | Status: DC | PRN
  Administered 2017-10-08: 10 mg via INTRAVENOUS

## 2017-10-08 MED ORDER — LIDOCAINE 2% (20 MG/ML) 5 ML SYRINGE
INTRAMUSCULAR | Status: DC | PRN
  Administered 2017-10-08: 100 mg via INTRAVENOUS

## 2017-10-08 MED ORDER — MIDAZOLAM HCL 2 MG/2ML IJ SOLN
INTRAMUSCULAR | Status: AC
  Filled 2017-10-08: qty 2

## 2017-10-08 MED ORDER — PROPOFOL 10 MG/ML IV BOLUS
INTRAVENOUS | Status: DC | PRN
  Administered 2017-10-08: 180 mg via INTRAVENOUS

## 2017-10-08 MED ORDER — PHENAZOPYRIDINE HCL 100 MG PO TABS: 100.0000 mg | ORAL_TABLET | Freq: Three times a day (TID) | ORAL | 0 refills | Status: DC | PRN

## 2017-10-08 MED ORDER — MIDAZOLAM HCL 5 MG/5ML IJ SOLN
INTRAMUSCULAR | Status: DC | PRN
  Administered 2017-10-08: 2 mg via INTRAVENOUS

## 2017-10-08 MED ORDER — ACETAMINOPHEN 500 MG PO TABS
1000.0000 mg | ORAL_TABLET | Freq: Once | ORAL | Status: AC | PRN
  Administered 2017-10-08: 1000 mg via ORAL
  Filled 2017-10-08: qty 2

## 2017-10-08 MED ORDER — GLYCOPYRROLATE 0.2 MG/ML IV SOSY
PREFILLED_SYRINGE | INTRAVENOUS | Status: AC
  Filled 2017-10-08: qty 5

## 2017-10-08 MED ORDER — FENTANYL CITRATE (PF) 100 MCG/2ML IJ SOLN: 25.0000 ug | INTRAMUSCULAR | Status: DC | PRN

## 2017-10-08 MED ORDER — FENTANYL CITRATE (PF) 100 MCG/2ML IJ SOLN
INTRAMUSCULAR | Status: AC
  Filled 2017-10-08: qty 2

## 2017-10-08 MED ORDER — GLYCOPYRROLATE 0.2 MG/ML IV SOSY
PREFILLED_SYRINGE | INTRAVENOUS | Status: DC | PRN
Start: 2017-10-08 — End: 2017-10-08
  Administered 2017-10-08: .3 mg via INTRAVENOUS

## 2017-10-08 SURGICAL SUPPLY — 18 items
BAG URINE DRAINAGE (UROLOGICAL SUPPLIES) IMPLANT
BAG URO CATCHER STRL LF (MISCELLANEOUS) ×2 IMPLANT
CATH INTERMIT  6FR 70CM (CATHETERS) ×2 IMPLANT
CLOTH BEACON ORANGE TIMEOUT ST (SAFETY) IMPLANT
COVER FOOTSWITCH UNIV (MISCELLANEOUS) ×2 IMPLANT
COVER SURGICAL LIGHT HANDLE (MISCELLANEOUS) IMPLANT
ELECT REM PT RETURN 15FT ADLT (MISCELLANEOUS) IMPLANT
GLOVE BIOGEL M STRL SZ7.5 (GLOVE) ×2 IMPLANT
GOWN STRL REUS W/TWL LRG LVL3 (GOWN DISPOSABLE) ×4 IMPLANT
GUIDEWIRE STR DUAL SENSOR (WIRE) ×2 IMPLANT
LOOP CUT BIPOLAR 24F LRG (ELECTROSURGICAL) ×2 IMPLANT
MANIFOLD NEPTUNE II (INSTRUMENTS) ×2 IMPLANT
NS IRRIG 1000ML POUR BTL (IV SOLUTION) ×2 IMPLANT
PACK CYSTO (CUSTOM PROCEDURE TRAY) ×2 IMPLANT
SET ASPIRATION TUBING (TUBING) IMPLANT
STENT CONTOUR 6FRX26X.038 (STENTS) ×2 IMPLANT
SYRINGE IRR TOOMEY STRL 70CC (SYRINGE) ×2 IMPLANT
TUBING CONNECTING 10 (TUBING) ×2 IMPLANT

## 2017-10-08 NOTE — Anesthesia Preprocedure Evaluation (Signed)
Anesthesia Evaluation  Patient identified by MRN, date of birth, ID band Patient awake    Reviewed: Allergy & Precautions, H&P , Patient's Chart, lab work & pertinent test results, reviewed documented beta blocker date and time   Airway Mallampati: II  TM Distance: >3 FB Neck ROM: full    Dental no notable dental hx.    Pulmonary former smoker,    Pulmonary exam normal breath sounds clear to auscultation       Cardiovascular hypertension,  Rhythm:regular Rate:Normal     Neuro/Psych    GI/Hepatic   Endo/Other    Renal/GU      Musculoskeletal   Abdominal   Peds  Hematology   Anesthesia Other Findings   Reproductive/Obstetrics                             Anesthesia Physical Anesthesia Plan  ASA: II  Anesthesia Plan: General   Post-op Pain Management:    Induction: Intravenous  PONV Risk Score and Plan: 1 and Ondansetron, Dexamethasone and Treatment may vary due to age or medical condition  Airway Management Planned: LMA  Additional Equipment:   Intra-op Plan:   Post-operative Plan:   Informed Consent: I have reviewed the patients History and Physical, chart, labs and discussed the procedure including the risks, benefits and alternatives for the proposed anesthesia with the patient or authorized representative who has indicated his/her understanding and acceptance.   Dental Advisory Given  Plan Discussed with: CRNA and Surgeon  Anesthesia Plan Comments: ( )        Anesthesia Quick Evaluation

## 2017-10-08 NOTE — Transfer of Care (Signed)
Immediate Anesthesia Transfer of Care Note  Patient: Jeremy Patrick  Procedure(s) Performed: TRANSURETHRAL RESECTION OF BLADDER TUMOR (TURBT) (N/A ) CYSTOSCOPY WITH BILATERAL  RETROGRADE PYELOGRAM/ LEFT URETERAL STENT PLACEMENT (N/A ) EXAM UNDER ANESTHESIA (N/A )  Patient Location: PACU  Anesthesia Type:General  Level of Consciousness: awake, alert  and oriented  Airway & Oxygen Therapy: Patient Spontanous Breathing and Patient connected to face mask oxygen  Post-op Assessment: Report given to RN  Post vital signs: Reviewed and stable  Last Vitals:  Vitals:   10/08/17 0955 10/08/17 1436  BP: (!) 165/80   Pulse: (!) 59 60  Resp: 18 15  Temp: (!) 36.4 C 36.8 C  SpO2: 100% 100%    Last Pain:  Vitals:   10/08/17 0955  TempSrc: Oral      Patients Stated Pain Goal: 4 (57/47/34 0370)  Complications: No apparent anesthesia complications

## 2017-10-08 NOTE — Anesthesia Procedure Notes (Signed)
Procedure Name: LMA Insertion Date/Time: 10/08/2017 1:42 PM Performed by: Kennede Lusk, Virgel Gess Pre-anesthesia Checklist: Patient identified, Emergency Drugs available, Suction available and Patient being monitored Patient Re-evaluated:Patient Re-evaluated prior to induction Oxygen Delivery Method: Circle System Utilized Preoxygenation: Pre-oxygenation with 100% oxygen Induction Type: IV induction Ventilation: Mask ventilation without difficulty LMA: LMA inserted LMA Size: 4.0 Number of attempts: 1 Airway Equipment and Method: Bite block Placement Confirmation: positive ETCO2 Tube secured with: Tape Dental Injury: Teeth and Oropharynx as per pre-operative assessment

## 2017-10-08 NOTE — Op Note (Signed)
Preoperative diagnosis: Urothelial carcinoma of bladder  Postoperative diagnosis: Urothelial carcinoma of bladder  Procedures: 1.  Cystoscopy 2.  Pelvic exam under anesthesia 3.  Bilateral retrograde pyelography with interpretation 4.  Left ureteral stent placement (6 x 26 - no string) 5.  Transurethral resection of bladder tumor (3 cm)  Surgeon: Pryor Curia. MD  Anesthesia: General  Complications: None  EBL: Minimal  Intraoperative findings: Bilateral retrograde pyelography was performed with a 6 French ureteral catheter.  The right ureter and renal collecting system appeared normal without dilation and no filling defects.  There was difficulty identifying the left ureter but once identified, a retrograde pyelography was able to be performed with a 6 French ureteral catheter.  There were no filling defects within the ureter or renal collecting system aside from obvious air bubbles which were confirmed.  No fixed filling defects were noted.  Indication: Jeremy Patrick is a 71 year old gentleman with history of high-grade, T1 urothelial carcinoma of the bladder.  He underwent initial induction BCG therapy.  On follow-up cystoscopy, he was noted to have a suspicious cytology along with erythematous changes around the trigone and bladder neck prompting a repeat evaluation.  He presents today for further evaluation and transurethral resection.  After reviewing the above procedures, he gave informed consent.  Description of procedure: The patient was taken to the operating room and a general anesthetic was administered.  He was given preoperative antibiotics, placed in the dorsal lithotomy position, and prepped and draped in the usual sterile fashion.  Next, a preoperative timeout was performed.  Cystourethroscopy was then performed with both a 30 and 70 degree lens.  This revealed a normal anterior and posterior urethra.  Inspection of the bladder revealed a slightly raised and erythematous  area of mucosa extending from the mid trigone and surrounding the left hemitrigone and extending to the bladder neck bilaterally.  The right ureteral orifice was easily identified and appeared normal.  The patient did have a left sided diverticulum near the left ureteral orifice which could not be easily identified initially.  Indigo carmine was administered intravenously and eventually blue colored urine was identified in the left ureteral orifice was identified.  Bilateral retrograde pyelography was performed with findings as stated above.  Due to the fact that the abnormal urothelium was surrounding the left ureteral orifice, it was decided to place a left ureteral stent.  A 0.038 sensor guidewire was advanced up the left ureter and the left renal pelvis under fluoroscopic guidance.  A 6 x 26 double-J ureteral stent was advanced over the wire using Seldinger technique and positioned appropriately under fluoroscopic and cystoscopic guidance.  Wire was removed with a good curl noted both in the bladder and the renal collecting system.  Attention then returned to the bladder.  The cystoscope was removed and replaced with the 26 French resectoscope sheath.  This was placed using a visual obturator.  Using bipolar loop resection, all of the abnormal appearing urothelium extending from the mid trigone and surrounding the left hemitrigone and extending to the bladder neck bilaterally was resected.  This area measured slightly over 3 cm.  This tissue was then removed for permanent pathologic analysis.  Additional resection of the base of the tumor sites was then obtained and this tissue was sent as a separate specimen.  Hemostasis was insured with loop cautery.  It was decided to leave the patient's indwelling stent considering the proximity of the left ureter to the resected tissue.  The bladder was  emptied and reinspected and hemostasis appeared excellent.  The procedure was ended.  He tolerated the procedure well  without complications.  He was able to be awakened and transferred to recovery unit in satisfactory condition.

## 2017-10-08 NOTE — Discharge Instructions (Signed)

## 2017-10-09 ENCOUNTER — Encounter (HOSPITAL_COMMUNITY): Payer: Self-pay | Admitting: Urology

## 2017-10-10 NOTE — Anesthesia Postprocedure Evaluation (Signed)
Anesthesia Post Note  Patient: Jeremy Patrick  Procedure(s) Performed: TRANSURETHRAL RESECTION OF BLADDER TUMOR (TURBT) (N/A ) CYSTOSCOPY WITH BILATERAL  RETROGRADE PYELOGRAM/ LEFT URETERAL STENT PLACEMENT (N/A ) EXAM UNDER ANESTHESIA (N/A )     Patient location during evaluation: PACU Anesthesia Type: General Level of consciousness: awake and alert Pain management: pain level controlled Vital Signs Assessment: post-procedure vital signs reviewed and stable Respiratory status: spontaneous breathing, nonlabored ventilation, respiratory function stable and patient connected to nasal cannula oxygen Cardiovascular status: blood pressure returned to baseline and stable Postop Assessment: no apparent nausea or vomiting Anesthetic complications: no    Last Vitals:  Vitals:   10/08/17 1500 10/08/17 1515  BP: (!) 149/88 (!) 153/96  Pulse: (!) 58 (!) 55  Resp: 12 15  Temp: 36.5 C 36.6 C  SpO2: 95% 96%    Last Pain:  Vitals:   10/09/17 1251  TempSrc:   PainSc: 3    Pain Goal: Patients Stated Pain Goal: 4 (10/08/17 1205)               Lyndle Herrlich EDWARD

## 2018-01-04 ENCOUNTER — Other Ambulatory Visit: Payer: Self-pay | Admitting: *Deleted

## 2018-01-21 ENCOUNTER — Ambulatory Visit (INDEPENDENT_AMBULATORY_CARE_PROVIDER_SITE_OTHER): Payer: Medicare Other | Admitting: Family Medicine

## 2018-01-21 ENCOUNTER — Encounter: Payer: Self-pay | Admitting: Family Medicine

## 2018-01-21 VITALS — BP 138/82 | HR 60 | Temp 97.9°F | Ht 72.0 in | Wt 210.0 lb

## 2018-01-21 DIAGNOSIS — M25512 Pain in left shoulder: Secondary | ICD-10-CM | POA: Diagnosis not present

## 2018-01-21 DIAGNOSIS — G8929 Other chronic pain: Secondary | ICD-10-CM | POA: Diagnosis not present

## 2018-01-21 MED ORDER — NAPROXEN 500 MG PO TABS: 500.0000 mg | ORAL_TABLET | Freq: Two times a day (BID) | ORAL | 0 refills | Status: DC | PRN

## 2018-01-21 NOTE — Assessment & Plan Note (Signed)
Doesn't appear to be coming from his glenohumeral joint. Possible to have a component of the pain from the acromioclavicular joint. No significant subacromial bursa. Does have some changes consistent with supraspinatus tendinopathy. - Try naproxen for 10 days then as needed - Counseled on supportive care - If no improvement then would consider an injection of the acromioclavicular joint or glenohumeral joint. Would consider imaging. Would consider physical therapy as well.

## 2018-01-21 NOTE — Patient Instructions (Signed)
Please take the naproxen for 10 days straight and then as needed. Please try the exercises that I provided Please follow-up with me in 3-4 weeks if her symptoms are not improved.

## 2018-01-21 NOTE — Progress Notes (Signed)
Jeremy Patrick - 72 y.o. male MRN [id number]  Date of birth: 03-06-1946  SUBJECTIVE:  Including CC & ROS.  Chief Complaint  Patient presents with  . Bilateral shoulder pain    Jeremy Patrick is a 72 y.o. male that is presenting with bilateral shoulder pain. Left is worse than right. Pain is chronic in nature. Pain has been increasing over the past several months. Pain is a constant stabbing, worse when he raises his arm. Located in the anterior aspect of his shoulders. Denies tingling or numbness. Denies injury or surgery. Denies any inciting event. Pain is moderate to severe in nature. He has not tried any therapies to help with the pain. He is not currently working. Has not received any prior steroid injections.   Review of Systems  Constitutional: Negative for fever.  Respiratory: Negative for shortness of breath.   Cardiovascular: Negative for chest pain.  Gastrointestinal: Negative for abdominal pain.  Musculoskeletal: Negative for gait problem.  Neurological: Negative for weakness.  Hematological: Negative for adenopathy.  Psychiatric/Behavioral: Negative for agitation.    HISTORY: Past Medical, Surgical, Social, and Family History Reviewed & Updated per EMR.   Pertinent Historical Findings include:  Past Medical History:  Diagnosis Date  . Cancer [facility])    bladder cancer  . GERD (gastroesophageal reflux disease)   . Hypertension     Past Surgical History:  Procedure Laterality Date  . BLADDER SURGERY    . CYSTOSCOPY N/A 04/06/2017   Procedure: CYSTOSCOPY;  Surgeon: Raynelle Bring, MD;  Location: [facility];  Service: Urology;  Laterality: N/A;  . CYSTOSCOPY W/ URETERAL STENT PLACEMENT N/A 10/08/2017   Procedure: CYSTOSCOPY WITH BILATERAL  RETROGRADE PYELOGRAM/ LEFT URETERAL STENT PLACEMENT;  Surgeon: Raynelle Bring, MD;  Location: [facility];  Service: Urology;  Laterality: N/A;  . TRANSURETHRAL RESECTION OF BLADDER TUMOR N/A 04/06/2017   Procedure: TRANSURETHRAL RESECTION OF  BLADDER TUMOR (TURBT);  Surgeon: Raynelle Bring, MD;  Location: [facility];  Service: Urology;  Laterality: N/A;  . TRANSURETHRAL RESECTION OF BLADDER TUMOR N/A 10/08/2017   Procedure: TRANSURETHRAL RESECTION OF BLADDER TUMOR (TURBT);  Surgeon: Raynelle Bring, MD;  Location: [facility];  Service: Urology;  Laterality: N/A;  . TRANSURETHRAL RESECTION OF BLADDER TUMOR WITH MITOMYCIN-C N/A 03/02/2017   Procedure: TRANSURETHRAL RESECTION OF BLADDER TUMOR/ EXAM UNDER ANESTHESIA/ WITH MITOMYCIN-C POST OPERATIVE;  Surgeon: Raynelle Bring, MD;  Location: [facility];  Service: Urology;  Laterality: N/A;    No Known Allergies  Family History  Problem Relation Age of Onset  . Hypertension Mother   . Stroke Mother   . Diabetes Mother   . Cataracts Mother   . Kidney disease Mother   . Hypertension Father   . Stroke Father   . Aneurysm Maternal Grandmother      Social History   Socioeconomic History  . Marital status: Married    Spouse name: Not on file  . Number of children: 4  . Years of education: 10  . Highest education level: Not on file  Social Needs  . Financial resource strain: Not on file  . Food insecurity - worry: Not on file  . Food insecurity - inability: Not on file  . Transportation needs - medical: Not on file  . Transportation needs - non-medical: Not on file  Occupational History  . Occupation: Retired  Tobacco Use  . Smoking status: Former Smoker    Packs/day: 0.15    Years: 10.00    Pack years: 1.50    Types: Cigarettes  Last attempt to quit: 1980    Years since quitting: 39.1  . Smokeless tobacco: Never Used  Substance and Sexual Activity  . Alcohol use: Yes    Comment: occasionally  . Drug use: No    Comment: "years ago"  . Sexual activity: Not on file  Other Topics Concern  . Not on file  Social History Narrative   Fun/Hobby: Go to [employer] and travel.      PHYSICAL EXAM:  VS: BP 138/82 (BP Location: Left Arm, Patient Position: Sitting, Cuff Size: Normal)   Pulse  60   Temp 97.9 F (36.6 C) (Oral)   Ht 6' (1.829 m)   Wt 210 lb (95.3 kg)   SpO2 98%   BMI 28.48 kg/m  Physical Exam Gen: NAD, alert, cooperative with exam, well-appearing ENT: normal lips, normal nasal mucosa,  Eye: normal EOM, normal conjunctiva and lids CV:  no edema, +2 pedal pulses   Resp: no accessory muscle use, non-labored,  Skin: no rashes, no areas of induration  Neuro: normal tone, normal sensation to touch Psych:  normal insight, alert and oriented MSK:  Left shoulder: Normal active flexion and abduction. Normal external rotation. Normal external and internal rotation to resistance. Pain with empty can testing. Pain with Hawkin's testing. Normal grip strength. Neurovascularly intact  Limited ultrasound: Left shoulder:  Normal-appearing biceps tendon in the short axis. Acromioclavicular joint degenerative changes but no significant effusion. Supraspinatus with mild changes of a tendinopathy but no tearing appreciated or significantly large bursa  Summary: Findings consistent with supraspinatus tendinopathy  Ultrasound and interpretation by Clearance Coots, MD           ASSESSMENT & PLAN:   Chronic left shoulder pain Doesn't appear to be coming from his glenohumeral joint. Possible to have a component of the pain from the acromioclavicular joint. No significant subacromial bursa. Does have some changes consistent with supraspinatus tendinopathy. - Try naproxen for 10 days then as needed - Counseled on supportive care - If no improvement then would consider an injection of the acromioclavicular joint or glenohumeral joint. Would consider imaging. Would consider physical therapy as well.

## 2018-01-26 ENCOUNTER — Encounter (HOSPITAL_COMMUNITY): Payer: Self-pay | Admitting: *Deleted

## 2018-01-26 ENCOUNTER — Other Ambulatory Visit: Payer: Self-pay

## 2018-01-26 ENCOUNTER — Other Ambulatory Visit: Payer: Self-pay | Admitting: Urology

## 2018-01-27 NOTE — H&P (Signed)
Office Visit Report     01/20/2018   --------------------------------------------------------------------------------   Jeremy Patrick  MRN: [id number]  PRIMARY CARE:  Nolene Ebbs, MD  DOB: March 14, 1946, 72 year old Male  REFERRING:  Wonda Cheng. Williams Che,   SSN: -[id number]  PROVIDER:  Raynelle Bring, M.D.    LOCATION:  [facility] [phone]   --------------------------------------------------------------------------------   CC/HPI: High-grade, T1 urothelial carcinoma of the bladder   Jeremy Patrick follows up today for continued cystoscopic surveillance after completion of the second induction 6 week course of BCG. He again tolerated therapy relatively well. He currently denies any hematuria or other voiding complaints.     ALLERGIES: None   MEDICATIONS: Lisinopril 40 mg tablet  Diltiazem 24Hr Er 360 mg capsule, extended release 24hr  Low Dose Aspirin Ec 81 mg tablet, delayed release  Ranitidine Hcl 300 mg capsule  Rosuvastatin Calcium 10 mg tablet     GU PSH: Bladder Instill AntiCA Agent - 12/09/2017, 12/02/2017, 11/25/2017, 11/18/2017, 11/11/2017, 11/04/2017, 06/11/2017, 06/03/2017, 05/27/2017, 05/20/2017, 05/13/2017, 05/06/2017 Cysto Remove Stent FB Sim - 10/21/2017 Cystoscopy - 07/31/2017, 02/11/2017 Cystoscopy Insert Stent, Left - 10/08/2017 Cystoscopy TURBT 2-5 cm - 10/08/2017, 04/06/2017, 03/02/2017 Locm 300-399Mg /Ml Iodine,1Ml - 02/19/2017    NON-GU PSH: None   GU PMH: Bladder Cancer overlapping sites - 03/20/2017 Bladder, Neoplasm of Unspecified behavior - 02/11/2017 Gross hematuria - 02/11/2017      PMH Notes:   1) Urothelial carcinoma of the bladder: He presented to me in March 2018 with painless gross hematuria. CT imaging was unremarkable except for borderline 8 mm RP lymph nodes and a possible bladder mass. Cystoscopy confirmed a 3.5 cm left lateral bladder tumor near the bladder neck.   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   Tobacco Use Assessment Completed: Used Tobacco in last 30 days? Does not drink anymore.  Drinks 1 caffeinated drink per day.    REVIEW OF SYSTEMS:    GU Review Male:   Patient denies leakage of urine, burning/ pain with urination, have to strain to urinate , frequent urination, stream starts and stops, hard to postpone urination, trouble starting your streams, and get up at night to urinate.  Gastrointestinal (Lower):   Patient denies diarrhea and constipation.  Gastrointestinal (Upper):   Patient denies nausea and vomiting.  Constitutional:   Patient denies fever, night sweats, weight loss, and fatigue.  Skin:   Patient denies skin rash/ lesion and itching.  Eyes:   Patient denies blurred vision and double vision.  Ears/ Nose/ Throat:   Patient denies sore throat and sinus problems.  Hematologic/Lymphatic:   Patient denies swollen glands and easy bruising.  Cardiovascular:   Patient denies leg swelling and chest pains.  Respiratory:   Patient denies cough and shortness of breath.  Endocrine:  Patient denies excessive thirst.  Musculoskeletal:   Patient denies back pain and joint pain.  Neurological:   Patient denies headaches and dizziness.  Psychologic:   Patient denies depression and  anxiety.   VITAL SIGNS:      01/20/2018 03:02 PM  Weight 200 lb / 90.72 kg  Height 72 in / 182.88 cm  BP 179/90 mmHg  Pulse 70 /min  BMI 27.1 kg/m   GU PHYSICAL EXAMINATION:    Urethral Meatus: Normal size. No lesion, no wart, no discharge, no polyp. Normal location.   MULTI-SYSTEM PHYSICAL EXAMINATION:    Constitutional: Well-nourished. No physical deformities. Normally developed. Good grooming.     PAST DATA REVIEWED:  Source Of History:  Patient  Urine Test Review:   Urinalysis   PROCEDURES:         Flexible Cystoscopy - 52000  Indication: High risk, non-muscle invasive bladder cancer Risks, benefits, and potential complications of the procedure were discussed with the patient including infection, bleeding, voiding discomfort, urinary retention, fever, chills, sepsis, and others. All questions were answered. Informed consent was obtained. Sterile technique and intraurethral analgesia were used.  Meatus:  Normal size. Normal location. Normal condition.  Urethra:  No strictures.  External Sphincter:  Normal.  Verumontanum:  Normal.  Prostate:  Moderate hyperplasia. Non-obstructing.  Bladder Neck:  Non-obstructing.  Ureteral Orifices:  Normal location. Normal size. Normal shape. Effluxed clear urine.  Bladder:  Systematic examination of the bladder did reveal some raised erythematous areas along the left anterior bladder, a small area on the posterior bladder, and a small area along the right lateral wall. These areas are not conclusive but are certainly suspicious for malignancy. Bladder washing was obtained for cytology.      Chaperone: AJ The procedure was well-tolerated and without complications. Instructions were given to call the office immediately if questions or problems.         Urinalysis Dipstick Dipstick Cont'd  Color: Yellow Bilirubin: Neg  Appearance: Clear Ketones: Neg  Specific Gravity: 1.015 Blood: Neg  pH: 6.0 Protein: Neg  Glucose: Neg Urobilinogen:  0.2    Nitrites: Neg    Leukocyte Esterase: Neg    ASSESSMENT:      ICD-10 Details  1 GU:   Bladder Cancer overlapping sites - C67.8    PLAN:           Orders Labs Urine Cytology  Lab Notes: Washing          Schedule Return Visit/Planned Activity: Other See Visit Notes             Note: Will call to schedule surgery.          Document Letter(s):  Created for Patient: Clinical Summary         Notes:   1. High-grade, T1 urothelial carcinoma of the bladder: His cystoscopic findings today are certainly suspicious and a bladder washing has been obtained for cytology. I recommended that he proceed with a repeat TURBT for further definitive evaluation after his recent second induction course of BCG. If he does have residual disease, we will certainly discuss proceeding with radical cystectomy although he has been hesitant to consider this option. His other options would be to consider chemotherapy/radiation or additional intravesical therapy possibly on a clinical trial which may be available to him in the near future.   We will therefore proceed with cystoscopy, retrograde pyelography, transurethral resection of bladder tumor. We have reviewed the potential risks, complications, and expected recovery process. He gives informed  consent to proceed.   Cc: Dr. Joycie Peek    E & M CODE: I spent at least 18 minutes face to face with the patient, more than 50% of that time was spent on counseling and/or coordinating care.     * Signed by Raynelle Bring, M.D. on 01/20/18 at 5:47 PM (EST)*

## 2018-01-28 ENCOUNTER — Encounter (HOSPITAL_COMMUNITY)
Admission: RE | Disposition: A | Discharge status: Home / Self Care | Payer: Self-pay | Source: Ambulatory Visit | Attending: Urology

## 2018-01-28 ENCOUNTER — Ambulatory Visit (HOSPITAL_COMMUNITY): Payer: Medicare Other

## 2018-01-28 ENCOUNTER — Ambulatory Visit (HOSPITAL_COMMUNITY)
Admission: RE | Admit: 2018-01-28 | Discharge: 2018-01-28 | Disposition: A | Discharge status: Home / Self Care | Payer: Medicare Other | Source: Ambulatory Visit | Attending: Urology | Admitting: Urology

## 2018-01-28 ENCOUNTER — Ambulatory Visit (HOSPITAL_COMMUNITY): Payer: Medicare Other | Admitting: Certified Registered Nurse Anesthetist

## 2018-01-28 ENCOUNTER — Encounter (HOSPITAL_COMMUNITY): Payer: Self-pay | Admitting: *Deleted

## 2018-01-28 DIAGNOSIS — I10 Essential (primary) hypertension: Secondary | ICD-10-CM | POA: Diagnosis not present

## 2018-01-28 DIAGNOSIS — K219 Gastro-esophageal reflux disease without esophagitis: Secondary | ICD-10-CM | POA: Diagnosis not present

## 2018-01-28 DIAGNOSIS — Z87891 Personal history of nicotine dependence: Secondary | ICD-10-CM | POA: Insufficient documentation

## 2018-01-28 DIAGNOSIS — N323 Diverticulum of bladder: Secondary | ICD-10-CM | POA: Diagnosis not present

## 2018-01-28 DIAGNOSIS — C678 Malignant neoplasm of overlapping sites of bladder: Secondary | ICD-10-CM | POA: Diagnosis not present

## 2018-01-28 DIAGNOSIS — Z7982 Long term (current) use of aspirin: Secondary | ICD-10-CM | POA: Insufficient documentation

## 2018-01-28 DIAGNOSIS — Z9221 Personal history of antineoplastic chemotherapy: Secondary | ICD-10-CM | POA: Diagnosis not present

## 2018-01-28 DIAGNOSIS — C679 Malignant neoplasm of bladder, unspecified: Secondary | ICD-10-CM | POA: Diagnosis present

## 2018-01-28 DIAGNOSIS — Z79899 Other long term (current) drug therapy: Secondary | ICD-10-CM | POA: Insufficient documentation

## 2018-01-28 HISTORY — PX: TRANSURETHRAL RESECTION OF BLADDER TUMOR: SHX2575

## 2018-01-28 HISTORY — PX: CYSTOSCOPY W/ RETROGRADES: SHX1426

## 2018-01-28 LAB — BASIC METABOLIC PANEL
Anion gap: 12 (ref 5–15)
BUN: 13 mg/dL (ref 6–20)
CO2: 22 mmol/L (ref 22–32)
Calcium: 8.9 mg/dL (ref 8.9–10.3)
Chloride: 107 mmol/L (ref 101–111)
Creatinine, Ser: 1.57 mg/dL — ABNORMAL HIGH (ref 0.61–1.24)
GFR calc Af Amer: 50 mL/min — ABNORMAL LOW (ref >60)
GFR calc non Af Amer: 43 mL/min — ABNORMAL LOW (ref >60)
Glucose, Bld: 106 mg/dL — ABNORMAL HIGH (ref 65–99)
Potassium: 4.1 mmol/L (ref 3.5–5.1)
Sodium: 141 mmol/L (ref 135–145)

## 2018-01-28 LAB — CBC
HCT: 38.7 % — ABNORMAL LOW (ref 39.0–52.0)
Hemoglobin: 12.2 g/dL — ABNORMAL LOW (ref 13.0–17.0)
MCH: 27.1 pg (ref 26.0–34.0)
MCHC: 31.5 g/dL (ref 30.0–36.0)
MCV: 85.8 fL (ref 78.0–100.0)
Platelets: 253 10*3/uL (ref 150–400)
RBC: 4.51 MIL/uL (ref 4.22–5.81)
RDW: 16 % — ABNORMAL HIGH (ref 11.5–15.5)
WBC: 6.1 10*3/uL (ref 4.0–10.5)

## 2018-01-28 SURGERY — TURBT (TRANSURETHRAL RESECTION OF BLADDER TUMOR)
Anesthesia: General

## 2018-01-28 MED ORDER — PROPOFOL 10 MG/ML IV BOLUS
INTRAVENOUS | Status: AC
  Filled 2018-01-28: qty 20

## 2018-01-28 MED ORDER — IOHEXOL 300 MG/ML  SOLN
INTRAMUSCULAR | Status: DC | PRN
  Administered 2018-01-28: 10 mL via URETHRAL

## 2018-01-28 MED ORDER — FENTANYL CITRATE (PF) 100 MCG/2ML IJ SOLN
INTRAMUSCULAR | Status: DC | PRN
  Administered 2018-01-28: 100 ug via INTRAVENOUS

## 2018-01-28 MED ORDER — CEFAZOLIN SODIUM-DEXTROSE 2-4 GM/100ML-% IV SOLN
2.0000 g | Freq: Once | INTRAVENOUS | Status: AC
  Administered 2018-01-28: 2 g via INTRAVENOUS
  Filled 2018-01-28: qty 100

## 2018-01-28 MED ORDER — PROPOFOL 10 MG/ML IV BOLUS
INTRAVENOUS | Status: DC | PRN
  Administered 2018-01-28: 150 mg via INTRAVENOUS

## 2018-01-28 MED ORDER — ROCURONIUM BROMIDE 10 MG/ML (PF) SYRINGE
PREFILLED_SYRINGE | INTRAVENOUS | Status: DC | PRN
  Administered 2018-01-28: 30 mg via INTRAVENOUS
  Administered 2018-01-28: 15 mg via INTRAVENOUS

## 2018-01-28 MED ORDER — EPHEDRINE SULFATE-NACL 50-0.9 MG/10ML-% IV SOSY
PREFILLED_SYRINGE | INTRAVENOUS | Status: DC | PRN
  Administered 2018-01-28: 7.5 mg via INTRAVENOUS

## 2018-01-28 MED ORDER — LACTATED RINGERS IV SOLN
INTRAVENOUS | Status: DC
  Administered 2018-01-28: 10:00:00 via INTRAVENOUS

## 2018-01-28 MED ORDER — DEXAMETHASONE SODIUM PHOSPHATE 4 MG/ML IJ SOLN
INTRAMUSCULAR | Status: DC | PRN
  Administered 2018-01-28: 5 mg via INTRAVENOUS

## 2018-01-28 MED ORDER — ROCURONIUM BROMIDE 10 MG/ML (PF) SYRINGE
PREFILLED_SYRINGE | INTRAVENOUS | Status: AC
  Filled 2018-01-28: qty 5

## 2018-01-28 MED ORDER — ONDANSETRON HCL 4 MG/2ML IJ SOLN
INTRAMUSCULAR | Status: DC | PRN
  Administered 2018-01-28: 4 mg via INTRAVENOUS

## 2018-01-28 MED ORDER — SODIUM CHLORIDE 0.9 % IR SOLN
Status: DC | PRN
  Administered 2018-01-28: 6280 mL

## 2018-01-28 MED ORDER — LIDOCAINE 2% (20 MG/ML) 5 ML SYRINGE
INTRAMUSCULAR | Status: DC | PRN
  Administered 2018-01-28: 100 mg via INTRAVENOUS

## 2018-01-28 MED ORDER — SUCCINYLCHOLINE CHLORIDE 200 MG/10ML IV SOSY
PREFILLED_SYRINGE | INTRAVENOUS | Status: DC | PRN
  Administered 2018-01-28: 120 mg via INTRAVENOUS

## 2018-01-28 MED ORDER — KETOROLAC TROMETHAMINE 30 MG/ML IJ SOLN: 15.0000 mg | Freq: Once | INTRAMUSCULAR | Status: DC | PRN

## 2018-01-28 MED ORDER — IOPAMIDOL (ISOVUE-300) INJECTION 61%
INTRAVENOUS | Status: AC
  Filled 2018-01-28: qty 50

## 2018-01-28 MED ORDER — PHENAZOPYRIDINE HCL 100 MG PO TABS: 100.0000 mg | ORAL_TABLET | Freq: Three times a day (TID) | ORAL | 0 refills | Status: DC | PRN

## 2018-01-28 MED ORDER — FENTANYL CITRATE (PF) 100 MCG/2ML IJ SOLN
INTRAMUSCULAR | Status: AC
  Filled 2018-01-28: qty 2

## 2018-01-28 MED ORDER — SUGAMMADEX SODIUM 200 MG/2ML IV SOLN
INTRAVENOUS | Status: DC | PRN
  Administered 2018-01-28: 200 mg via INTRAVENOUS

## 2018-01-28 MED ORDER — PROMETHAZINE HCL 25 MG/ML IJ SOLN: 6.2500 mg | INTRAMUSCULAR | Status: DC | PRN

## 2018-01-28 MED ORDER — FENTANYL CITRATE (PF) 100 MCG/2ML IJ SOLN: 25.0000 ug | INTRAMUSCULAR | Status: DC | PRN

## 2018-01-28 SURGICAL SUPPLY — 18 items
BAG URINE DRAINAGE (UROLOGICAL SUPPLIES) IMPLANT
BAG URO CATCHER STRL LF (MISCELLANEOUS) ×2 IMPLANT
CATH INTERMIT  6FR 70CM (CATHETERS) ×2 IMPLANT
CLOTH BEACON ORANGE TIMEOUT ST (SAFETY) ×2 IMPLANT
COVER FOOTSWITCH UNIV (MISCELLANEOUS) IMPLANT
COVER SURGICAL LIGHT HANDLE (MISCELLANEOUS) ×2 IMPLANT
ELECT REM PT RETURN 15FT ADLT (MISCELLANEOUS) ×2 IMPLANT
GLOVE BIOGEL M STRL SZ7.5 (GLOVE) ×2 IMPLANT
GOWN STRL REUS W/TWL LRG LVL3 (GOWN DISPOSABLE) ×4 IMPLANT
GUIDEWIRE STR DUAL SENSOR (WIRE) ×2 IMPLANT
LOOP CUT BIPOLAR 24F LRG (ELECTROSURGICAL) IMPLANT
MANIFOLD NEPTUNE II (INSTRUMENTS) ×2 IMPLANT
PACK CYSTO (CUSTOM PROCEDURE TRAY) ×2 IMPLANT
SET ASPIRATION TUBING (TUBING) IMPLANT
STENT URET 6FRX24 CONTOUR (STENTS) ×2 IMPLANT
SYRINGE IRR TOOMEY STRL 70CC (SYRINGE) IMPLANT
TUBING CONNECTING 10 (TUBING) ×2 IMPLANT
TUBING UROLOGY SET (TUBING) ×2 IMPLANT

## 2018-01-28 NOTE — Anesthesia Procedure Notes (Signed)
Procedure Name: Intubation Date/Time: 01/28/2018 12:41 PM Performed by: Claudia Desanctis, CRNA Pre-anesthesia Checklist: Patient identified, Emergency Drugs available, Suction available and Patient being monitored Patient Re-evaluated:Patient Re-evaluated prior to induction Oxygen Delivery Method: Circle system utilized Preoxygenation: Pre-oxygenation with 100% oxygen Induction Type: IV induction Ventilation: Mask ventilation without difficulty Laryngoscope Size: 2 and Miller Grade View: Grade II Tube type: Oral Number of attempts: 1 Airway Equipment and Method: Stylet Placement Confirmation: ETT inserted through vocal cords under direct vision,  positive ETCO2 and breath sounds checked- equal and bilateral Secured at: 21 cm Tube secured with: Tape Dental Injury: Teeth and Oropharynx as per pre-operative assessment  Comments: Placed by Dene Gentry, SRNA

## 2018-01-28 NOTE — Discharge Instructions (Signed)

## 2018-01-28 NOTE — Progress Notes (Signed)
Phase II PACU Pt refuses to take the prescription of Pyridium, reports hid insurance will not pay for it. Wife states will call Dr Alinda Money office and ask them to change the prescription.

## 2018-01-28 NOTE — Transfer of Care (Signed)
Immediate Anesthesia Transfer of Care Note  Patient: Jeremy Patrick  Procedure(s) Performed: TRANSURETHRAL RESECTION OF BLADDER TUMOR (TURBT) (N/A ) CYSTOSCOPY WITH RETROGRADE PYELOGRAM (N/A )  Patient Location: PACU  Anesthesia Type:General  Level of Consciousness: drowsy and patient cooperative  Airway & Oxygen Therapy: Patient Spontanous Breathing and Patient connected to face mask  Post-op Assessment: Report given to RN and Post -op Vital signs reviewed and stable  Post vital signs: Reviewed and stable  Last Vitals:  Vitals:   01/28/18 0908  BP: (!) 160/89  Pulse: 63  Resp: 16  Temp: 36.9 C  SpO2: 99%    Last Pain:  Vitals:   01/28/18 0908  TempSrc: Oral         Complications: No apparent anesthesia complications

## 2018-01-28 NOTE — Interval H&P Note (Signed)
History and Physical Interval Note:  01/28/2018 11:11 AM  Jeremy Patrick  has presented today for surgery, with the diagnosis of BLADDER CANCER  The various methods of treatment have been discussed with the patient and family. After consideration of risks, benefits and other options for treatment, the patient has consented to  Procedure(s) with comments: TRANSURETHRAL RESECTION OF BLADDER TUMOR (TURBT) (N/A) - GENERAL ANESTHESIA WITH PARALYSIS CYSTOSCOPY WITH RETROGRADE PYELOGRAM (N/A) as a surgical intervention .  The patient's history has been reviewed, patient examined, no change in status, stable for surgery.  I have reviewed the patient's chart and labs.  Questions were answered to the patient's satisfaction.     Sumedh Shinsato,LES

## 2018-01-28 NOTE — Anesthesia Postprocedure Evaluation (Signed)
Anesthesia Post Note  Patient: Jeremy Patrick  Procedure(s) Performed: TRANSURETHRAL RESECTION OF BLADDER TUMOR (TURBT) (N/A ) CYSTOSCOPY WITH RETROGRADE PYELOGRAM (N/A )     Patient location during evaluation: PACU Anesthesia Type: General Level of consciousness: awake and alert Pain management: pain level controlled Vital Signs Assessment: post-procedure vital signs reviewed and stable Respiratory status: spontaneous breathing, nonlabored ventilation, respiratory function stable and patient connected to nasal cannula oxygen Cardiovascular status: blood pressure returned to baseline and stable Postop Assessment: no apparent nausea or vomiting Anesthetic complications: no    Last Vitals:  Vitals:   01/28/18 0908 01/28/18 1345  BP: (!) 160/89 128/68  Pulse: 63 60  Resp: 16 10  Temp: 36.9 C (!) 36.4 C  SpO2: 99% 100%    Last Pain:  Vitals:   01/28/18 0908  TempSrc: Oral                 Anetha Slagel,Kahlil S

## 2018-01-28 NOTE — Anesthesia Preprocedure Evaluation (Signed)
Anesthesia Evaluation  Patient identified by MRN, date of birth, ID band Patient awake    Reviewed: Allergy & Precautions, NPO status , Patient's Chart, lab work & pertinent test results  Airway Mallampati: II  TM Distance: >3 FB Neck ROM: Full    Dental no notable dental hx.    Pulmonary neg pulmonary ROS, former smoker,    Pulmonary exam normal breath sounds clear to auscultation       Cardiovascular hypertension, Normal cardiovascular exam Rhythm:Regular Rate:Normal     Neuro/Psych negative neurological ROS  negative psych ROS   GI/Hepatic Neg liver ROS, GERD  Medicated,  Endo/Other  negative endocrine ROS  Renal/GU negative Renal ROS  negative genitourinary   Musculoskeletal negative musculoskeletal ROS (+)   Abdominal   Peds negative pediatric ROS (+)  Hematology negative hematology ROS (+)   Anesthesia Other Findings   Reproductive/Obstetrics negative OB ROS                             Anesthesia Physical Anesthesia Plan  ASA: II  Anesthesia Plan: General   Post-op Pain Management:    Induction: Intravenous  PONV Risk Score and Plan: 2 and Ondansetron, Dexamethasone and Treatment may vary due to age or medical condition  Airway Management Planned: Oral ETT  Additional Equipment:   Intra-op Plan:   Post-operative Plan: Extubation in OR  Informed Consent: I have reviewed the patients History and Physical, chart, labs and discussed the procedure including the risks, benefits and alternatives for the proposed anesthesia with the patient or authorized representative who has indicated his/her understanding and acceptance.   Dental advisory given  Plan Discussed with: CRNA and Surgeon  Anesthesia Plan Comments:         Anesthesia Quick Evaluation

## 2018-01-28 NOTE — Op Note (Signed)
Preoperative diagnosis: 1. Bladder cancer  Postoperative diagnosis:  1. Bladder cancer  Procedure:  1. Cystoscopy 2. Transurethral resection of bladder tumor (2.5 cm) 3. Bilateral retrograde pyelography with interpretation 4. Left ureteral stent placement (6 x 24-no string)  Surgeon: Roxy Horseman, Brooke Bonito. M.D.  Anesthesia: General  Complications: None  Intraoperative findings: 1. Retrograde pyelography: Bilateral retrograde pyelograms were performed with a 6 French ureteral catheter and Omnipaque contrast.  There were no ureteral or renal collecting system filling defects noted.  EBL: Minimal  Specimens: 1. Left lateral bladder wall 2. Anterior bladder  Disposition of specimens: Pathology  Indication: Jeremy Patrick is a patient who with a history of high-grade, T1 bladder cancer status post 2 courses of induction BCG.   Recent cystoscopy indicated concern for persistent recurrence.  After reviewing the management options for treatment, he elected to proceed with the above surgical procedure(s). We have discussed the potential benefits and risks of the procedure, side effects of the proposed treatment, the likelihood of the patient achieving the goals of the procedure, and any potential problems that might occur during the procedure or recuperation. Informed consent has been obtained.  Description of procedure:  The patient was taken to the operating room and general anesthesia was induced.  The patient was placed in the dorsal lithotomy position, prepped and draped in the usual sterile fashion, and preoperative antibiotics were administered. A preoperative time-out was performed.   Cystourethroscopy was performed.  The patient's urethra was examined and was normal.   The bladder was then systematically examined in its entirety.  There was noted to be a raised erythematous area in the left lateral bladder wall measuring approximately 2.5 cm.  This was in the vicinity of his  prior tumor.  He was noted to have a diverticulum just to be on the left ureteral orifice.  Around this area and extending slightly into the diverticulum was a raised erythematous area again raising concern for possible tumor.  No other abnormalities were identified.  Attention then turned to the left ureteral orifice and a ureteral catheter was used to intubate the ureteral orifice.  Omnipaque contrast was injected through the ureteral catheter and a retrograde pyelogram was performed with findings as dictated above.  Attention then turned to the right ureteral orifice and a ureteral catheter was used to intubate the ureteral orifice.  Omnipaque contrast was injected through the ureteral catheter and a retrograde pyelogram was performed with findings as dictated above.  It was noted that the left ureteral orifice was quite close to the tumor surrounding the left lateral bladder wall tumor.  Therefore, it was felt that ureteral stent placement was indicated.  A 0.38 Sensor guidewire was advanced up the left ureter under fluoroscopic guidance.  A 6 x 24 double-J ureteral stent was then advanced over the wire using Seldinger technique.  The wire was then removed and a good curl was noted in the renal pelvis as well as within the bladder.  The bladder was then re-examined after the resectoscope was placed.  The largest bladder tumor was 2.5 cm. Using loop cautery resection, the suspected tumor areas at the left lateral bladder wall and anterior bladder were resected in their entirety.  These were sent as separate specimens.  It was felt that adequate muscle was removed with the specimens.  Hemostasis was then achieved with the loop cautery and the bladder was emptied and reinspected with no further bleeding noted at the end of the procedure.    The  bladder was then emptied and the procedure ended.  The patient appeared to tolerate the procedure well and without complications.  The patient was able to be  awakened and transferred to the recovery unit in satisfactory condition.    Pryor Curia MD

## 2018-01-29 ENCOUNTER — Encounter (HOSPITAL_COMMUNITY): Payer: Self-pay | Admitting: Urology

## 2018-02-12 ENCOUNTER — Telehealth: Payer: Self-pay | Admitting: *Deleted

## 2018-02-12 MED ORDER — LISINOPRIL 40 MG PO TABS: 40.0000 mg | ORAL_TABLET | Freq: Every day | ORAL | 0 refills | Status: DC

## 2018-02-12 MED ORDER — DILTIAZEM HCL ER COATED BEADS 360 MG PO CP24: 360.0000 mg | ORAL_CAPSULE | Freq: Every day | ORAL | 0 refills | Status: DC

## 2018-02-15 ENCOUNTER — Telehealth: Payer: Self-pay | Admitting: Nurse Practitioner

## 2018-02-15 MED ORDER — DILTIAZEM HCL ER COATED BEADS 360 MG PO CP24: 360.0000 mg | ORAL_CAPSULE | Freq: Every day | ORAL | 0 refills | Status: DC

## 2018-02-15 MED ORDER — LISINOPRIL 40 MG PO TABS: 40.0000 mg | ORAL_TABLET | Freq: Every day | ORAL | 0 refills | Status: DC

## 2018-02-15 MED ORDER — RANITIDINE HCL 300 MG PO TABS: 300.0000 mg | ORAL_TABLET | ORAL | 0 refills | Status: DC

## 2018-02-15 NOTE — Telephone Encounter (Signed)
[facility] called stating they need supervising MD to sign these two scripts and send back in.  Is also requesting ranitidine to be added to the medications.   States patient is out of medication and becoming concerned.

## 2018-02-15 NOTE — Addendum Note (Signed)
Addended by: Earnstine Regal on: 02/15/2018 09:54 AM   Modules accepted: Orders

## 2018-02-15 NOTE — Telephone Encounter (Signed)
Meds: Diltiazem, Lisinopril, Ranitidine PCP: Olena Mater (Has upcoming appointment in April} Pharmacy needs PA approval for meds due to change in providers [place], [place] [phone] (Phone) [phone] (Fax)

## 2018-02-15 NOTE — Telephone Encounter (Signed)
Resent rx's w/ supervising MD info.Marland KitchenChryl Heck

## 2018-02-15 NOTE — Telephone Encounter (Signed)
Copied from [place] [id number]. Topic: Quick Communication - Rx Refill/Question >> Feb 15, 2018  9:29 AM Lysle Morales L, NT wrote: Medication: diltiazem (CARDIZEM CD) 360 MG 24 hr capsule ,andlisinopril  PRINIVIL,ZESTRIL 40 MG tablet and also  ranitidine ZANTAC 300 MG tablet  Has the patient contacted their pharmacy? yes  (Agent: If no, request that the patient contact the pharmacy for the refill Preferred Pharmacy (with phone number or street name): [place], [place] [employer] [phone] Phone [phone] (Fax Agent: Please be advised that RX refills may take up to 3 business days. We ask that you follow-up with your pharmacy.  The pharmacy says they need a prior authorization for each medication due to to the patient having a new PCP

## 2018-02-16 MED ORDER — RANITIDINE HCL 300 MG PO TABS: 300.0000 mg | ORAL_TABLET | ORAL | 0 refills | Status: DC

## 2018-02-16 MED ORDER — DILTIAZEM HCL ER COATED BEADS 360 MG PO CP24: 360.0000 mg | ORAL_CAPSULE | Freq: Every day | ORAL | 0 refills | Status: DC

## 2018-02-16 MED ORDER — LISINOPRIL 40 MG PO TABS: 40.0000 mg | ORAL_TABLET | Freq: Every day | ORAL | 0 refills | Status: DC

## 2018-02-16 NOTE — Telephone Encounter (Signed)
Sent new rx to [facility].Marland KitchenJohny Chess

## 2018-04-02 ENCOUNTER — Other Ambulatory Visit: Payer: Self-pay | Admitting: Nurse Practitioner

## 2018-04-05 ENCOUNTER — Other Ambulatory Visit: Payer: Self-pay | Admitting: Internal Medicine

## 2018-04-06 ENCOUNTER — Ambulatory Visit (INDEPENDENT_AMBULATORY_CARE_PROVIDER_SITE_OTHER): Payer: Medicare Other | Admitting: Family Medicine

## 2018-04-06 ENCOUNTER — Ambulatory Visit: Payer: Medicare Other | Admitting: Nurse Practitioner

## 2018-04-06 ENCOUNTER — Encounter: Payer: Self-pay | Admitting: Family Medicine

## 2018-04-06 VITALS — BP 130/78 | HR 64 | Temp 98.5°F | Resp 12 | Ht 72.0 in | Wt 204.2 lb

## 2018-04-06 DIAGNOSIS — R05 Cough: Secondary | ICD-10-CM | POA: Diagnosis not present

## 2018-04-06 DIAGNOSIS — N529 Male erectile dysfunction, unspecified: Secondary | ICD-10-CM | POA: Insufficient documentation

## 2018-04-06 DIAGNOSIS — H6121 Impacted cerumen, right ear: Secondary | ICD-10-CM

## 2018-04-06 DIAGNOSIS — R059 Cough, unspecified: Secondary | ICD-10-CM

## 2018-04-06 DIAGNOSIS — J310 Chronic rhinitis: Secondary | ICD-10-CM

## 2018-04-06 DIAGNOSIS — I10 Essential (primary) hypertension: Secondary | ICD-10-CM

## 2018-04-06 MED ORDER — SILDENAFIL CITRATE 20 MG PO TABS: 40.0000 mg | ORAL_TABLET | Freq: Every day | ORAL | 1 refills | Status: DC | PRN

## 2018-04-06 MED ORDER — FLUTICASONE PROPIONATE 50 MCG/ACT NA SUSP: 1.0000 | Freq: Two times a day (BID) | NASAL | 3 refills | Status: DC

## 2018-04-06 NOTE — Patient Instructions (Signed)
A few things to remember from today's visit:   Erectile dysfunction, unspecified erectile dysfunction type - Plan: sildenafil (REVATIO) 20 MG tablet  Chronic rhinitis  There are 2 forms of allergic rhinitis: . Seasonal (hay fever): Caused by an allergy to pollen and/or mold spores in the air. Pollen is the fine powder that comes from the stamen of flowering plants. It can be carried through the air and is easily inhaled. Symptoms are seasonal and usually occur in spring, late summer, and fall. Marland Kitchen Perennial: Caused by other allergens such as dust mites, pet hair or dander, or mold. Symptoms occur year-round.  Symptoms: Your symptoms can vary, depending on the severity of your allergies. Symptoms can include: Sneezing, coughing.itching (mostly eyes, nose, mouth, throat and skin),runny nose,stuffy nose.headache,pressure in the nose and cheeks,ear fullness and popping, sore throat.watery, red, or swollen eyes,dark circles under your eyes,trouble smelling, and sometimes hives.  Allergic rhinitis cannot be prevented. You can help your symptoms by avoiding the things that you are allergic, including: . Keeping windows closed. This is especially important during high-pollen seasons. . Washing your hands after petting animals. . Using dust- and mite-proof bedding and mattress covers. . Wearing glasses outside to protect your eyes. . Showering before bed to wash off allergens from hair and skin. You can also avoid things that can make your symptoms worse, such as: . aerosol sprays . air pollution . cold temperatures . humidity . irritating fumes . tobacco smoke . wind . wood smoke.   Antihistamines help reduce the sneezing, runny nose, and itchiness of allergies. These come in pill form and as nasal sprays. Allegra,Zyrtec,or Claritin are some examples. Decongestants, such as pseudoephedrine and phenylephrine, help temporarily relieve the stuffy nose of allergies. Decongestants are found in many  medicines and come as pills, nose sprays, and nose drops. They could increase heart rate and cause tachycardia and tremor. Nasal Afrin should not be used for more than 3 days because you can become dependent on them. This causes you to feel even more stopped-up when you try to quit using them.  Nasal sprays: steroids or antihistaminics. Over the counter intranasal sterids: Nasocort,Rhinocort,or Flonase.You won't notice their benefits for up to 2 weeks after starting them. Allergy shots or sublingual tablets when other treatment do not help.This is done by immunologists.   Please be sure medication list is accurate. If a new problem present, please set up appointment sooner than planned today.

## 2018-04-06 NOTE — Assessment & Plan Note (Signed)
Adequately controlled. No changes in current management. Low salt diet to continue. Eye exam current. F/U in 6 months, before if needed.

## 2018-04-06 NOTE — Assessment & Plan Note (Signed)
Symptoms improved with intranasal steroids. Continue Flonase nasal spray daily as needed. Nasal irrigations with saline may also help. Follow-up as needed.

## 2018-04-06 NOTE — Assessment & Plan Note (Signed)
We discussed a few pharmacologic treatment options as well as side effects. He agrees with trying sildenafil 40 mg daily as needed. He can follow with urologist if needed.

## 2018-04-06 NOTE — Progress Notes (Signed)
HPI:   Jeremy Patrick is a 72 y.o. male, who is here today with his fianc to establish care.  Former PCP: Mauricio Po, FNP Last preventive routine visit: 03/2017  Chronic medical problems: OA (bilateral shoulder pain),HTN,GERD,positive HCV ab, history of bladder cancer (S/P transurethral resection of bladder tumor 01/2018). He follows with urologist, Dr. Alinda Money.  Hypertension:   Mid 30's.  Currently on lisinopril 40 mg daily and Diltiazem CD 360 mg daily.. BP's at home "good."  He is taking medications as instructed, no side effects reported. Last eye exam 3 months ago.  He has not noted unusual headache, visual changes, exertional chest pain, dyspnea,  focal weakness, or edema.   Lab Results  Component Value Date   CREATININE 1.57 (H) 01/28/2018   BUN 13 01/28/2018   NA 141 01/28/2018   K 4.1 01/28/2018   CL 107 01/28/2018   CO2 22 01/28/2018    He is on Aspirin 81 mg, he was on 325 mg daily.  He denies history of CVA or CAD.  Concerns today:   Non-productive cough intermittently for a few weeks, which he attributes to allergies. Intermittent nasal congestion, rhinorrhea, sinus pressure. Symptoms improved after he used his fiance's Flonase nasal spray. He is not taking any OTC antihistaminic. No fever or chills.  Former smoker, no history of COPD.  He is also requesting medication to help with erectile dysfunction. He states that he has not tried medication before. He has had this problem intermittently for years. No decreased libido or nipple discharge.  Recently he had home health visit, he was told he needed an ear lavage of right ear.  He has been complaining of intermittent hearing loss, no earache or ear drainage.   Review of Systems  Constitutional: Negative for activity change, appetite change, fatigue and fever.  HENT: Positive for congestion, postnasal drip and rhinorrhea. Negative for facial swelling, mouth sores, nosebleeds, sore  throat and trouble swallowing.   Eyes: Negative for redness and visual disturbance.  Respiratory: Positive for cough. Negative for shortness of breath and wheezing.   Cardiovascular: Negative for chest pain, palpitations and leg swelling.  Gastrointestinal: Negative for abdominal pain, nausea and vomiting.  Endocrine: Negative for cold intolerance and heat intolerance.  Genitourinary: Negative for decreased urine volume, dysuria and hematuria.  Musculoskeletal: Positive for arthralgias. Negative for gait problem.  Skin: Negative for rash.  Allergic/Immunologic: Positive for environmental allergies.  Neurological: Negative for syncope, weakness and headaches.      Current Outpatient Medications on File Prior to Visit  Medication Sig Dispense Refill  . ASPIRIN 81 PO Take 81 mg by mouth daily.    Marland Kitchen diltiazem (CARDIZEM CD) 360 MG 24 hr capsule Take 1 capsule (360 mg total) by mouth daily. Must keep scheduled appt w/new provider for future refills 90 capsule 0  . Ferrous Gluconate (IRON 27 PO) Take 27 mg by mouth daily.    . Glucos-Chondroit-Hyaluron-MSM (GLUCOSAMINE CHONDROITIN JOINT PO) Take 1,500 mg by mouth daily.    Marland Kitchen lisinopril (PRINIVIL,ZESTRIL) 40 MG tablet Take 1 tablet (40 mg total) by mouth daily. Must keep scheduled appt w/new provider for future refills 90 tablet 0  . ranitidine (ZANTAC) 300 MG tablet Take 1 tablet (300 mg total) by mouth every morning. Must keep appt for future refills 90 tablet 0   No current facility-administered medications on file prior to visit.      Past Medical History:  Diagnosis Date  . Cancer ([place])  bladder cancer  . GERD (gastroesophageal reflux disease)   . Hypertension    No Known Allergies  Family History  Problem Relation Age of Onset  . Hypertension Mother   . Stroke Mother   . Diabetes Mother   . Cataracts Mother   . Kidney disease Mother   . Hypertension Father   . Stroke Father   . Aneurysm Maternal Grandmother     Social  History   Socioeconomic History  . Marital status: Married    Spouse name: Not on file  . Number of children: 4  . Years of education: 10  . Highest education level: Not on file  Occupational History  . Occupation: Retired  Scientific laboratory technician  . Financial resource strain: Not on file  . Food insecurity:    Worry: Not on file    Inability: Not on file  . Transportation needs:    Medical: Not on file    Non-medical: Not on file  Tobacco Use  . Smoking status: Former Smoker    Packs/day: 0.15    Years: 10.00    Pack years: 1.50    Types: Cigarettes    Last attempt to quit: 1980    Years since quitting: 39.3  . Smokeless tobacco: Never Used  Substance and Sexual Activity  . Alcohol use: Yes    Comment: occasionally  . Drug use: No    Comment: "years ago"  . Sexual activity: Not on file  Lifestyle  . Physical activity:    Days per week: Not on file    Minutes per session: Not on file  . Stress: Not on file  Relationships  . Social connections:    Talks on phone: Not on file    Gets together: Not on file    Attends religious service: Not on file    Active member of club or organization: Not on file    Attends meetings of clubs or organizations: Not on file    Relationship status: Not on file  Other Topics Concern  . Not on file  Social History Narrative   Fun/Hobby: Go to [employer] and travel.     Vitals:   04/06/18 1003  BP: 130/78  Pulse: 64  Resp: 12  Temp: 98.5 F (36.9 C)  SpO2: 97%    Body mass index is 27.7 kg/m.   Physical Exam  Nursing note and vitals reviewed. Constitutional: He is oriented to person, place, and time. He appears well-developed and well-nourished. No distress.  HENT:  Head: Normocephalic and atraumatic.  Mouth/Throat: Oropharynx is clear and moist and mucous membranes are normal.  Eyes: Pupils are equal, round, and reactive to light. Conjunctivae are normal.  Cardiovascular: Normal rate and regular rhythm.  Murmur (soft SEM RUSB)  heard. Pulses:      Dorsalis pedis pulses are 2+ on the right side, and 2+ on the left side.  Respiratory: Effort normal and breath sounds normal. No respiratory distress.  GI: Soft. He exhibits no mass. There is no hepatomegaly. There is no tenderness.  Musculoskeletal: He exhibits no edema.  Lymphadenopathy:    He has no cervical adenopathy.  Neurological: He is alert and oriented to person, place, and time. He has normal strength. Gait normal.  Skin: Skin is warm. No rash noted. No erythema.  Psychiatric: He has a normal mood and affect. Cognition and memory are normal.  Well groomed, good eye contact.    ASSESSMENT AND PLAN:  Mr.Tayshaun was seen today for establish care.  Diagnoses  and all orders for this visit:  Hearing loss of right ear due to cerumen impaction  After verbal consent he had right ear lavage, well-tolerated.  He reports improvement of right hearing loss. Recommend avoiding Q-tips. Follow-up as needed.  Cough  Possible etiologies discussed: Allergies, medication side effects, GERD, and COPD and wants home. Because he has reported improvement with Flonase nasal spray, he will continue this medication as needed. If cough persist we may consider changing antihypertensive medication. Lung auscultation negative today, I do not think imaging is needed today but needs to be considered if symptom is persistent. Follow-up as needed   Essential hypertension Adequately controlled. No changes in current management. Low salt diet to continue. Eye exam current. F/U in 6 months, before if needed.   Chronic rhinitis Symptoms improved with intranasal steroids. Continue Flonase nasal spray daily as needed. Nasal irrigations with saline may also help. Follow-up as needed.  Erectile dysfunction We discussed a few pharmacologic treatment options as well as side effects. He agrees with trying sildenafil 40 mg daily as needed. He can follow with urologist if  needed.     Betty G. Martinique, MD  [facility]. [place] office.

## 2018-04-13 ENCOUNTER — Telehealth: Payer: Self-pay | Admitting: Family Medicine

## 2018-04-13 NOTE — Telephone Encounter (Signed)
Copied from [place] [id number]. Topic: Quick Communication - See Telephone Encounter >> Apr 13, 2018  8:54 AM Ether Griffins B wrote: CRM for notification. See Telephone encounter for: 04/13/18.  Pt is needing a PA on sildenafil (REVATIO) 20 MG tablet. [facility]

## 2018-04-15 NOTE — Telephone Encounter (Signed)
Prior auth sent to [email or web address].

## 2018-04-19 ENCOUNTER — Telehealth: Payer: Self-pay | Admitting: Family Medicine

## 2018-04-19 NOTE — Telephone Encounter (Signed)
Copied from [place] [id number]. Topic: General - Other >> Apr 19, 2018 10:21 AM Yvette Rack wrote: Reason for CRM: patient wife Virl Diamond calling stating that the provider has to resubmitted the Viagra so that their insurance will pay for it  it has to be the Viagra

## 2018-04-20 ENCOUNTER — Other Ambulatory Visit: Payer: Self-pay | Admitting: Family Medicine

## 2018-04-20 MED ORDER — SILDENAFIL CITRATE 50 MG PO TABS
50.0000 mg | ORAL_TABLET | Freq: Every day | ORAL | 3 refills | Status: AC | PRN
Start: 2018-04-20 — End: ?

## 2018-04-20 NOTE — Telephone Encounter (Signed)
Message sent to Dr. Jordan for review and approval. 

## 2018-04-20 NOTE — Telephone Encounter (Signed)
Rx for Viagra 50 mg sent to his pharmacy, brand name as requested. Thanks, BJ

## 2018-04-26 ENCOUNTER — Emergency Department (HOSPITAL_COMMUNITY): Payer: Medicare Other

## 2018-04-26 ENCOUNTER — Other Ambulatory Visit: Payer: Self-pay

## 2018-04-26 ENCOUNTER — Emergency Department (HOSPITAL_COMMUNITY)
Admission: EM | Admit: 2018-04-26 | Discharge: 2018-04-26 | Disposition: A | Discharge status: Home / Self Care | Payer: Medicare Other | Attending: Emergency Medicine | Admitting: Emergency Medicine

## 2018-04-26 ENCOUNTER — Encounter (HOSPITAL_COMMUNITY): Payer: Self-pay

## 2018-04-26 DIAGNOSIS — Z8551 Personal history of malignant neoplasm of bladder: Secondary | ICD-10-CM | POA: Diagnosis not present

## 2018-04-26 DIAGNOSIS — R0789 Other chest pain: Secondary | ICD-10-CM | POA: Diagnosis not present

## 2018-04-26 DIAGNOSIS — Z87891 Personal history of nicotine dependence: Secondary | ICD-10-CM | POA: Diagnosis not present

## 2018-04-26 DIAGNOSIS — J90 Pleural effusion, not elsewhere classified: Secondary | ICD-10-CM | POA: Diagnosis not present

## 2018-04-26 DIAGNOSIS — R0602 Shortness of breath: Secondary | ICD-10-CM | POA: Diagnosis not present

## 2018-04-26 DIAGNOSIS — R918 Other nonspecific abnormal finding of lung field: Secondary | ICD-10-CM | POA: Diagnosis not present

## 2018-04-26 DIAGNOSIS — I10 Essential (primary) hypertension: Secondary | ICD-10-CM | POA: Diagnosis not present

## 2018-04-26 DIAGNOSIS — C679 Malignant neoplasm of bladder, unspecified: Secondary | ICD-10-CM | POA: Diagnosis not present

## 2018-04-26 DIAGNOSIS — Z79899 Other long term (current) drug therapy: Secondary | ICD-10-CM | POA: Insufficient documentation

## 2018-04-26 DIAGNOSIS — R05 Cough: Secondary | ICD-10-CM | POA: Diagnosis present

## 2018-04-26 LAB — CBC WITH DIFFERENTIAL/PLATELET
Basophils Absolute: 0.1 10*3/uL (ref 0.0–0.1)
Basophils Relative: 1 %
Eosinophils Absolute: 0.1 10*3/uL (ref 0.0–0.7)
Eosinophils Relative: 1 %
HCT: 36.4 % — ABNORMAL LOW (ref 39.0–52.0)
Hemoglobin: 10.7 g/dL — ABNORMAL LOW (ref 13.0–17.0)
Lymphocytes Relative: 23 %
Lymphs Abs: 1.7 10*3/uL (ref 0.7–4.0)
MCH: 24.3 pg — ABNORMAL LOW (ref 26.0–34.0)
MCHC: 29.4 g/dL — ABNORMAL LOW (ref 30.0–36.0)
MCV: 82.7 fL (ref 78.0–100.0)
Monocytes Absolute: 1.3 10*3/uL — ABNORMAL HIGH (ref 0.1–1.0)
Monocytes Relative: 18 %
Neutro Abs: 4.2 10*3/uL (ref 1.7–7.7)
Neutrophils Relative %: 57 %
Platelets: 307 10*3/uL (ref 150–400)
RBC: 4.4 MIL/uL (ref 4.22–5.81)
RDW: 15.1 % (ref 11.5–15.5)
WBC: 7.3 10*3/uL (ref 4.0–10.5)

## 2018-04-26 LAB — BASIC METABOLIC PANEL
Anion gap: 9 (ref 5–15)
BUN: 18 mg/dL (ref 6–20)
CO2: 26 mmol/L (ref 22–32)
Calcium: 8.8 mg/dL — ABNORMAL LOW (ref 8.9–10.3)
Chloride: 106 mmol/L (ref 101–111)
Creatinine, Ser: 1.71 mg/dL — ABNORMAL HIGH (ref 0.61–1.24)
GFR calc Af Amer: 45 mL/min — ABNORMAL LOW (ref >60)
GFR calc non Af Amer: 39 mL/min — ABNORMAL LOW (ref >60)
Glucose, Bld: 97 mg/dL (ref 65–99)
Potassium: 4 mmol/L (ref 3.5–5.1)
Sodium: 141 mmol/L (ref 135–145)

## 2018-04-26 LAB — I-STAT TROPONIN, ED: Troponin i, poc: 0 ng/mL (ref 0.00–0.08)

## 2018-04-26 MED ORDER — IOPAMIDOL (ISOVUE-300) INJECTION 61%
100.0000 mL | Freq: Once | INTRAVENOUS | Status: AC | PRN
Start: 2018-04-26 — End: 2018-04-26
  Administered 2018-04-26: 80 mL via INTRAVENOUS

## 2018-04-26 MED ORDER — GUAIFENESIN-DM 100-10 MG/5ML PO SYRP: 5.0000 mL | ORAL_SOLUTION | Freq: Three times a day (TID) | ORAL | 0 refills | Status: AC | PRN

## 2018-04-26 MED ORDER — SODIUM CHLORIDE 0.9 % IV BOLUS
500.0000 mL | Freq: Once | INTRAVENOUS | Status: AC
  Administered 2018-04-26: 500 mL via INTRAVENOUS

## 2018-04-26 MED ORDER — ONDANSETRON 4 MG PO TBDP: 4.0000 mg | ORAL_TABLET | Freq: Once | ORAL | Status: DC

## 2018-04-26 MED ORDER — ONDANSETRON 4 MG PO TBDP: 4.0000 mg | ORAL_TABLET | Freq: Three times a day (TID) | ORAL | 0 refills | Status: DC | PRN

## 2018-04-26 MED ORDER — IOPAMIDOL (ISOVUE-300) INJECTION 61%
INTRAVENOUS | Status: AC
  Filled 2018-04-26: qty 100

## 2018-04-26 NOTE — ED Triage Notes (Signed)
Patient reports a productive cough with yellow/orange sputum x 1 month. Patient states he coughs so hard that it makes him vomit. Patient reports that he has been taking generic OTC Robitussin.

## 2018-04-26 NOTE — ED Notes (Signed)
Patient provided with urinal.

## 2018-04-26 NOTE — Discharge Instructions (Signed)
Follow-up with the cancer center and Dr. Alinda Money for your likely metastatic cancer.

## 2018-04-26 NOTE — ED Notes (Signed)
Patient transported to CT 

## 2018-04-26 NOTE — Telephone Encounter (Signed)
Fax received from [facility] stating the request was denied and this was given to Dr Jordan's asst. 

## 2018-04-26 NOTE — ED Notes (Signed)
ED Provider at bedside. 

## 2018-04-26 NOTE — ED Notes (Signed)
Patient transported to X-ray 

## 2018-04-26 NOTE — ED Provider Notes (Signed)
[facility] DEPT Provider Note   CSN: [id number] Arrival date & time: 04/26/18  7829     History   Chief Complaint Chief Complaint  Patient presents with  . Cough    HPI Jeremy Patrick is a 72 y.o. male.  HPI Patient presents with cough.  Has had over the last month.  States will cough so much that he will vomit.  Had some sputum production that is gone from yellow it back to white.  States he does have some weakness.  Dull right sided lower chest pain at times.  No real abdominal pain.  No fever.  Has had some nausea but mostly just vomiting after coughing.  No fevers.  Former smoker. Past Medical History:  Diagnosis Date  . Cancer [facility])    bladder cancer  . GERD (gastroesophageal reflux disease)   . Hypertension     Patient Active Problem List   Diagnosis Date Noted  . Chronic rhinitis 04/06/2018  . Erectile dysfunction 04/06/2018  . Chronic left shoulder pain 01/21/2018  . Hepatitis C antibody test positive 04/23/2017  . Medicare annual wellness visit, subsequent 03/30/2017  . Routine adult health maintenance 03/30/2017  . Malignant neoplasm of urinary bladder ([place]) 03/30/2017  . Essential hypertension 03/03/2017  . Gastroesophageal reflux disease without esophagitis 03/03/2017    Past Surgical History:  Procedure Laterality Date  . BLADDER SURGERY    . CYSTOSCOPY N/A 04/06/2017   Procedure: CYSTOSCOPY;  Surgeon: Raynelle Bring, MD;  Location: [facility];  Service: Urology;  Laterality: N/A;  . CYSTOSCOPY W/ RETROGRADES N/A 01/28/2018   Procedure: CYSTOSCOPY WITH RETROGRADE PYELOGRAM;  Surgeon: Raynelle Bring, MD;  Location: [facility];  Service: Urology;  Laterality: N/A;  . CYSTOSCOPY W/ URETERAL STENT PLACEMENT N/A 10/08/2017   Procedure: CYSTOSCOPY WITH BILATERAL  RETROGRADE PYELOGRAM/ LEFT URETERAL STENT PLACEMENT;  Surgeon: Raynelle Bring, MD;  Location: [facility];  Service: Urology;  Laterality: N/A;  . TRANSURETHRAL RESECTION OF BLADDER  TUMOR N/A 04/06/2017   Procedure: TRANSURETHRAL RESECTION OF BLADDER TUMOR (TURBT);  Surgeon: Raynelle Bring, MD;  Location: [facility];  Service: Urology;  Laterality: N/A;  . TRANSURETHRAL RESECTION OF BLADDER TUMOR N/A 10/08/2017   Procedure: TRANSURETHRAL RESECTION OF BLADDER TUMOR (TURBT);  Surgeon: Raynelle Bring, MD;  Location: [facility];  Service: Urology;  Laterality: N/A;  . TRANSURETHRAL RESECTION OF BLADDER TUMOR N/A 01/28/2018   Procedure: TRANSURETHRAL RESECTION OF BLADDER TUMOR (TURBT);  Surgeon: Raynelle Bring, MD;  Location: [facility];  Service: Urology;  Laterality: N/A;  GENERAL ANESTHESIA WITH PARALYSIS  . TRANSURETHRAL RESECTION OF BLADDER TUMOR WITH MITOMYCIN-C N/A 03/02/2017   Procedure: TRANSURETHRAL RESECTION OF BLADDER TUMOR/ EXAM UNDER ANESTHESIA/ WITH MITOMYCIN-C POST OPERATIVE;  Surgeon: Raynelle Bring, MD;  Location: [facility];  Service: Urology;  Laterality: N/A;        Home Medications    Prior to Admission medications   Medication Sig Start Date End Date Taking? Authorizing Provider  Cyanocobalamin (VITAMIN B 12 PO) Take 1 tablet by mouth daily.   Yes [provider]  diltiazem (CARDIZEM CD) 360 MG 24 hr capsule Take 1 capsule (360 mg total) by mouth daily. Must keep scheduled appt w/new provider for future refills 02/16/18  Yes Lance Sell, NP  Ferrous Gluconate (IRON 27 PO) Take 27 mg by mouth daily.   Yes [provider]  fluticasone (FLONASE) 50 MCG/ACT nasal spray Place 1 spray into both nostrils 2 (two) times daily. 04/06/18  Yes Martinique, Betty G, MD  Glucos-Chondroit-Hyaluron-MSM (GLUCOSAMINE CHONDROITIN JOINT PO) Take 1,500 mg by mouth daily.   Yes [provider]  lisinopril (PRINIVIL,ZESTRIL) 40 MG tablet Take 1 tablet (40 mg total) by mouth daily. Must keep scheduled appt w/new provider for future refills 02/16/18  Yes Lance Sell, NP  ranitidine (ZANTAC) 300 MG tablet Take 1 tablet (300 mg total) by mouth every morning. Must  keep appt for future refills 02/16/18  Yes Shambley, Delphia Grates, NP  guaiFENesin-dextromethorphan (ROBITUSSIN DM) 100-10 MG/5ML syrup Take 5 mLs by mouth 3 (three) times daily as needed for cough. 04/26/18   Davonna Belling, MD  ondansetron (ZOFRAN-ODT) 4 MG disintegrating tablet Take 1 tablet (4 mg total) by mouth every 8 (eight) hours as needed for nausea or vomiting. 04/26/18   Davonna Belling, MD  sildenafil (VIAGRA) 50 MG tablet Take 1 tablet (50 mg total) by mouth daily as needed for erectile dysfunction. Patient not taking: Reported on 04/26/2018 04/20/18   Martinique, Betty G, MD    Family History Family History  Problem Relation Age of Onset  . Hypertension Mother   . Stroke Mother   . Diabetes Mother   . Cataracts Mother   . Kidney disease Mother   . Hypertension Father   . Stroke Father   . Aneurysm Maternal Grandmother     Social History Social History   Tobacco Use  . Smoking status: Former Smoker    Packs/day: 0.15    Years: 10.00    Pack years: 1.50    Types: Cigarettes    Last attempt to quit: 1980    Years since quitting: 39.4  . Smokeless tobacco: Never Used  Substance Use Topics  . Alcohol use: Yes    Comment: occasionally  . Drug use: No    Comment: "years ago"     Allergies   Patient has no known allergies.   Review of Systems Review of Systems  Constitutional: Positive for appetite change and unexpected weight change. Negative for fatigue and fever.  HENT: Negative for congestion.   Respiratory: Positive for cough and shortness of breath.   Cardiovascular: Positive for chest pain.  Gastrointestinal: Positive for vomiting. Negative for abdominal pain.  Genitourinary: Negative for flank pain.  Musculoskeletal: Negative for arthralgias and back pain.  Skin: Negative for rash.  Neurological: Negative for weakness.  Hematological: Negative for adenopathy.  Psychiatric/Behavioral: Negative for confusion.     Physical Exam Updated Vital Signs BP  (!) 162/80   Pulse 74   Temp 99.4 F (37.4 C) (Oral)   Resp 16   Ht 6' (1.829 m)   Wt 88.5 kg (195 lb)   SpO2 100%   BMI 26.45 kg/m   Physical Exam  Constitutional: He appears well-developed.  HENT:  Head: Atraumatic.  Eyes: Pupils are equal, round, and reactive to light.  Neck: Neck supple.  Cardiovascular: Normal rate.  Pulmonary/Chest: He has no wheezes. He has no rales. He exhibits no tenderness.  Abdominal: Soft.  Musculoskeletal: He exhibits no tenderness.  Neurological: He is alert.  Skin: Skin is warm. Capillary refill takes less than 2 seconds.     ED Treatments / Results  Labs (all labs ordered are listed, but only abnormal results are displayed) Labs Reviewed  CBC WITH DIFFERENTIAL/PLATELET - Abnormal; Notable for the following components:      Result Value   Hemoglobin 10.7 (*)    HCT 36.4 (*)    MCH 24.3 (*)    MCHC 29.4 (*)    Monocytes Absolute 1.3 (*)  All other components within normal limits  BASIC METABOLIC PANEL - Abnormal; Notable for the following components:   Creatinine, Ser 1.71 (*)    Calcium 8.8 (*)    GFR calc non Af Amer 39 (*)    GFR calc Af Amer 45 (*)    All other components within normal limits  I-STAT CHEM 8, ED - Abnormal; Notable for the following components:   Potassium 6.8 (*)    BUN 23 (*)    Creatinine, Ser 1.60 (*)    Calcium, Ion 0.99 (*)    Hemoglobin 12.2 (*)    HCT 36.0 (*)    All other components within normal limits  I-STAT TROPONIN, ED    EKG EKG Interpretation  Date/Time:  Monday Apr 26 2018 12:37:35 EDT Ventricular Rate:  69 PR Interval:    QRS Duration: 89 QT Interval:  403 QTC Calculation: 432 R Axis:   45 Text Interpretation:  Sinus rhythm Confirmed by Davonna Belling [phone]) on 04/26/2018 1:12:34 PM   Radiology Dg Chest 2 View  Result Date: 04/26/2018 CLINICAL DATA:  Cough, shortness of breath EXAM: CHEST - 2 VIEW COMPARISON:  02/27/2017 FINDINGS: Multiple bilateral pulmonary masses and  pulmonary nodules with the largest in the right middle lobe measuring 5 cm. Small right pleural effusion. No pneumothorax. Stable cardiomediastinal silhouette. IMPRESSION: Multiple bilateral pulmonary masses and pulmonary nodules most concerning for metastatic disease. Electronically Signed   By: Kathreen Devoid   On: 04/26/2018 14:09   Ct Chest W Contrast  Result Date: 04/26/2018 CLINICAL DATA:  Bladder cancer with productive cough. EXAM: CT CHEST, ABDOMEN, AND PELVIS WITH CONTRAST TECHNIQUE: Multidetector CT imaging of the chest, abdomen and pelvis was performed following the standard protocol during bolus administration of intravenous contrast. CONTRAST:  69mL ISOVUE-300 IOPAMIDOL (ISOVUE-300) INJECTION 61% COMPARISON:  Chest x-ray from earlier today. FINDINGS: CT CHEST FINDINGS Cardiovascular: The heart size is normal. No pericardial effusion. Atherosclerotic calcification is noted in the wall of the thoracic aorta. Mediastinum/Nodes: Upper normal mediastinal lymph nodes evident. No axillary lymphadenopathy. 13 mm short axis right hilar lymph node is identified. There is a 17 mm lymph node in the inferior right hilum (image 32/2). 2.4 cm short axis lower paraesophageal lymph node visible on image 47/series 2. Lungs/Pleura: The central tracheobronchial airways are patent. Multiple bilateral pulmonary nodules and masses are evident, more prominent in the right hemithorax than the left. Dominant left lung nodule is identified in the lingula and measures 2.3 cm (image 80/series 6). Multiple pulmonary masses are identified in the right lung including 3.2 cm right upper lobe lesion on image 50 and a 4.4 cm right middle lobe mass on image 85. Multiple right pleural nodules are identified in the posterior lower chest in there is a small right pleural effusion. Musculoskeletal: Bone windows reveal no worrisome lytic or sclerotic osseous lesions. CT ABDOMEN PELVIS FINDINGS Hepatobiliary: Scattered homogeneous,  hypoattenuating lesions are identified in the liver measuring up to 15 mm. Larger lesions are most suggestive of cysts. The smaller lesions cannot be definitively characterized. Multiple calcified gallstones measure up to about 13 mm diameter. No intrahepatic or extrahepatic biliary dilation. Pancreas: No focal mass lesion. No dilatation of the main duct. No intraparenchymal cyst. No peripancreatic edema. Spleen: No splenomegaly. No focal mass lesion. Adrenals/Urinary Tract: No adrenal nodule or mass. Kidneys unremarkable. No evidence for hydroureter. Mild circumferential wall thickening noted in the bladder. Subtle 10 mm soft tissue nodule is identified along the posterior left bladder wall, inferior to the left  UVJ (see image 116/series 2 and coronal image 67/series 4. Stomach/Bowel: Tiny hiatal hernia. Stomach otherwise unremarkable. Duodenum is normally positioned as is the ligament of Treitz. No small bowel wall thickening. No small bowel dilatation. The terminal ileum is normal. The appendix is normal. Scattered diverticuli are seen in the right and left colon without diverticulitis. Vascular/Lymphatic: There is abdominal aortic atherosclerosis without aneurysm. There is no gastrohepatic or hepatoduodenal ligament lymphadenopathy. No intraperitoneal or retroperitoneal lymphadenopathy. No pelvic sidewall lymphadenopathy. Reproductive: The prostate gland and seminal vesicles have normal imaging features. Other: No intraperitoneal free fluid. Musculoskeletal: Bone windows reveal no worrisome lytic or sclerotic osseous lesions. IMPRESSION: 1. Bilateral pulmonary nodules and masses, right greater than left are associated with right-sided pleural disease and small right pleural effusion. Metastatic disease in this patient with history of bladder cancer would be a distinct consideration although thoracic primary not excluded. 2. Lower paraesophageal lymphadenopathy 3. Mild circumferential bladder wall thickening with  a 10 mm apparent bladder wall nodule posteriorly on the left. 4. No evidence for metastatic disease in the abdomen or pelvis. 5. Cholelithiasis. 6.  Aortic Atherosclerois (ICD10-170.0) Electronically Signed   By: Misty Stanley M.D.   On: 04/26/2018 15:29   Ct Abdomen Pelvis W Contrast  Result Date: 04/26/2018 CLINICAL DATA:  Bladder cancer with productive cough. EXAM: CT CHEST, ABDOMEN, AND PELVIS WITH CONTRAST TECHNIQUE: Multidetector CT imaging of the chest, abdomen and pelvis was performed following the standard protocol during bolus administration of intravenous contrast. CONTRAST:  54mL ISOVUE-300 IOPAMIDOL (ISOVUE-300) INJECTION 61% COMPARISON:  Chest x-ray from earlier today. FINDINGS: CT CHEST FINDINGS Cardiovascular: The heart size is normal. No pericardial effusion. Atherosclerotic calcification is noted in the wall of the thoracic aorta. Mediastinum/Nodes: Upper normal mediastinal lymph nodes evident. No axillary lymphadenopathy. 13 mm short axis right hilar lymph node is identified. There is a 17 mm lymph node in the inferior right hilum (image 32/2). 2.4 cm short axis lower paraesophageal lymph node visible on image 47/series 2. Lungs/Pleura: The central tracheobronchial airways are patent. Multiple bilateral pulmonary nodules and masses are evident, more prominent in the right hemithorax than the left. Dominant left lung nodule is identified in the lingula and measures 2.3 cm (image 80/series 6). Multiple pulmonary masses are identified in the right lung including 3.2 cm right upper lobe lesion on image 50 and a 4.4 cm right middle lobe mass on image 85. Multiple right pleural nodules are identified in the posterior lower chest in there is a small right pleural effusion. Musculoskeletal: Bone windows reveal no worrisome lytic or sclerotic osseous lesions. CT ABDOMEN PELVIS FINDINGS Hepatobiliary: Scattered homogeneous, hypoattenuating lesions are identified in the liver measuring up to 15 mm.  Larger lesions are most suggestive of cysts. The smaller lesions cannot be definitively characterized. Multiple calcified gallstones measure up to about 13 mm diameter. No intrahepatic or extrahepatic biliary dilation. Pancreas: No focal mass lesion. No dilatation of the main duct. No intraparenchymal cyst. No peripancreatic edema. Spleen: No splenomegaly. No focal mass lesion. Adrenals/Urinary Tract: No adrenal nodule or mass. Kidneys unremarkable. No evidence for hydroureter. Mild circumferential wall thickening noted in the bladder. Subtle 10 mm soft tissue nodule is identified along the posterior left bladder wall, inferior to the left UVJ (see image 116/series 2 and coronal image 67/series 4. Stomach/Bowel: Tiny hiatal hernia. Stomach otherwise unremarkable. Duodenum is normally positioned as is the ligament of Treitz. No small bowel wall thickening. No small bowel dilatation. The terminal ileum is normal. The appendix is normal. Scattered  diverticuli are seen in the right and left colon without diverticulitis. Vascular/Lymphatic: There is abdominal aortic atherosclerosis without aneurysm. There is no gastrohepatic or hepatoduodenal ligament lymphadenopathy. No intraperitoneal or retroperitoneal lymphadenopathy. No pelvic sidewall lymphadenopathy. Reproductive: The prostate gland and seminal vesicles have normal imaging features. Other: No intraperitoneal free fluid. Musculoskeletal: Bone windows reveal no worrisome lytic or sclerotic osseous lesions. IMPRESSION: 1. Bilateral pulmonary nodules and masses, right greater than left are associated with right-sided pleural disease and small right pleural effusion. Metastatic disease in this patient with history of bladder cancer would be a distinct consideration although thoracic primary not excluded. 2. Lower paraesophageal lymphadenopathy 3. Mild circumferential bladder wall thickening with a 10 mm apparent bladder wall nodule posteriorly on the left. 4. No  evidence for metastatic disease in the abdomen or pelvis. 5. Cholelithiasis. 6.  Aortic Atherosclerois (ICD10-170.0) Electronically Signed   By: Misty Stanley M.D.   On: 04/26/2018 15:29    Procedures Procedures (including critical care time)  Medications Ordered in ED Medications  iopamidol (ISOVUE-300) 61 % injection (has no administration in time range)  sodium chloride 0.9 % bolus 500 mL (0 mLs Intravenous Stopped 04/26/18 1529)  sodium chloride 0.9 % bolus 500 mL (0 mLs Intravenous Stopped 04/26/18 1538)  iopamidol (ISOVUE-300) 61 % injection 100 mL (80 mLs Intravenous Contrast Given 04/26/18 1452)     Initial Impression / Assessment and Plan / ED Course  I have reviewed the triage vital signs and the nursing notes.  Pertinent labs & imaging results that were available during my care of the patient were reviewed by me and considered in my medical decision making (see chart for details).     Patient with cough.  Some mild sputum production.  X-ray done showed likely lung masses.  CT scan done and showed bilateral lung masses and thickened bladder.  History of bladder cancer.  Discussed with Dr. Irene Limbo and he will follow-up in the office.  Also follow-up with Dr. Alinda Money, his urologist.  Discharge home.  Final Clinical Impressions(s) / ED Diagnoses   Final diagnoses:  Lung mass    ED Discharge Orders        Ordered    guaiFENesin-dextromethorphan (ROBITUSSIN DM) 100-10 MG/5ML syrup  3 times daily PRN     04/26/18 1601    ondansetron (ZOFRAN-ODT) 4 MG disintegrating tablet  Every 8 hours PRN     04/26/18 1604       Davonna Belling, MD 04/26/18 1627

## 2018-04-26 NOTE — Telephone Encounter (Signed)
Message sent to Dr. Jordan for review. 

## 2018-04-27 ENCOUNTER — Telehealth: Payer: Self-pay | Admitting: Hematology

## 2018-04-27 NOTE — Telephone Encounter (Signed)
Spoke with patient and he stated that medication was resubmitted on the 14th and had to be the brand name medication for insurance to cover it. Patient stated that everything has been taking care of and medication is on the way from the mail order pharmacy. No further assistance needed at this time.

## 2018-04-27 NOTE — Telephone Encounter (Signed)
Referral from Dr. Alvino Chapel at the ED for a dx of metastatic carcinoma of unknown primary.   Tc to the pt to schedule him to see Dr. Irene Limbo on 5/22 at 1pm. Pt aware to arrive 30 minutes early to be checked in on time. Office location given.

## 2018-04-27 NOTE — Telephone Encounter (Signed)
Please covey this information to pt. Explained that as we discussed, these type medications are usually not covered by health insurance.  No further recommendations in this regard.  Thanks, BJ

## 2018-04-28 ENCOUNTER — Inpatient Hospital Stay: Payer: Medicare Other | Attending: Hematology | Admitting: Hematology

## 2018-04-28 ENCOUNTER — Telehealth: Payer: Self-pay | Admitting: *Deleted

## 2018-04-28 ENCOUNTER — Telehealth: Payer: Self-pay

## 2018-04-28 ENCOUNTER — Encounter: Payer: Self-pay | Admitting: Hematology

## 2018-04-28 VITALS — BP 160/105 | HR 82 | Temp 98.3°F | Resp 18 | Ht 72.0 in | Wt 192.0 lb

## 2018-04-28 DIAGNOSIS — C7800 Secondary malignant neoplasm of unspecified lung: Secondary | ICD-10-CM | POA: Diagnosis not present

## 2018-04-28 DIAGNOSIS — C678 Malignant neoplasm of overlapping sites of bladder: Secondary | ICD-10-CM

## 2018-04-28 DIAGNOSIS — R5383 Other fatigue: Secondary | ICD-10-CM | POA: Diagnosis not present

## 2018-04-28 DIAGNOSIS — K219 Gastro-esophageal reflux disease without esophagitis: Secondary | ICD-10-CM | POA: Insufficient documentation

## 2018-04-28 DIAGNOSIS — Z79899 Other long term (current) drug therapy: Secondary | ICD-10-CM | POA: Diagnosis not present

## 2018-04-28 DIAGNOSIS — J90 Pleural effusion, not elsewhere classified: Secondary | ICD-10-CM | POA: Diagnosis not present

## 2018-04-28 DIAGNOSIS — C801 Malignant (primary) neoplasm, unspecified: Secondary | ICD-10-CM | POA: Diagnosis not present

## 2018-04-28 DIAGNOSIS — Z8551 Personal history of malignant neoplasm of bladder: Secondary | ICD-10-CM | POA: Diagnosis not present

## 2018-04-28 DIAGNOSIS — I700 Atherosclerosis of aorta: Secondary | ICD-10-CM | POA: Diagnosis not present

## 2018-04-28 DIAGNOSIS — I129 Hypertensive chronic kidney disease with stage 1 through stage 4 chronic kidney disease, or unspecified chronic kidney disease: Secondary | ICD-10-CM | POA: Diagnosis not present

## 2018-04-28 DIAGNOSIS — Z575 Occupational exposure to toxic agents in other industries: Secondary | ICD-10-CM | POA: Diagnosis not present

## 2018-04-28 DIAGNOSIS — R634 Abnormal weight loss: Secondary | ICD-10-CM

## 2018-04-28 DIAGNOSIS — R05 Cough: Secondary | ICD-10-CM

## 2018-04-28 DIAGNOSIS — R630 Anorexia: Secondary | ICD-10-CM | POA: Diagnosis not present

## 2018-04-28 DIAGNOSIS — K8020 Calculus of gallbladder without cholecystitis without obstruction: Secondary | ICD-10-CM | POA: Insufficient documentation

## 2018-04-28 DIAGNOSIS — N189 Chronic kidney disease, unspecified: Secondary | ICD-10-CM | POA: Insufficient documentation

## 2018-04-28 DIAGNOSIS — Z87891 Personal history of nicotine dependence: Secondary | ICD-10-CM | POA: Insufficient documentation

## 2018-04-28 DIAGNOSIS — C799 Secondary malignant neoplasm of unspecified site: Secondary | ICD-10-CM

## 2018-04-28 DIAGNOSIS — M549 Dorsalgia, unspecified: Secondary | ICD-10-CM | POA: Diagnosis not present

## 2018-04-28 DIAGNOSIS — R059 Cough, unspecified: Secondary | ICD-10-CM

## 2018-04-28 MED ORDER — BENZONATATE 100 MG PO CAPS: 100.0000 mg | ORAL_CAPSULE | Freq: Three times a day (TID) | ORAL | 0 refills | Status: DC | PRN

## 2018-04-28 MED ORDER — ONDANSETRON 4 MG PO TBDP: 4.0000 mg | ORAL_TABLET | Freq: Three times a day (TID) | ORAL | 0 refills | Status: AC | PRN

## 2018-04-28 NOTE — Telephone Encounter (Signed)
Printed avs and calender of upcoming appointment. Per 5/22 los

## 2018-04-28 NOTE — Progress Notes (Signed)
HEMATOLOGY/ONCOLOGY CONSULTATION NOTE  Date of Service: 04/28/2018  Patient Care Team: Martinique, Betty G, MD as PCP - General (Family Medicine)  Raynelle Bring, MD as Urologist  CHIEF COMPLAINTS/PURPOSE OF CONSULTATION:  Metastatic Malignancy of Unknown Primary  HISTORY OF PRESENTING ILLNESS:   Jeremy Patrick is a wonderful 72 y.o. male who has been referred to [place] by Dr Betty Martinique for evaluation and management of metastatic malignancy of unknown primary. He is accompanied today by his wife. The pt reports that he is doing well overall.   The pt reports that he first noticed hematuria two years ago. He has had multiple bladder tumors removed since then, with the last resection in February 2019. He denies knowledge of metastasis and has been followed by urology and hasnt been seen by medical oncology previously.  He notes that his hematuria has resolved. His last treatment with intravesical Mitomycin C was 2-3 months ago, soon after his last tumor resection.   He presented to the ED on 04/26/18 because he was having intractable coughing. He notes that he would cough so much that he began to throw up. He denies coughing up any blood and has brought up some clear phlegm. He first noticed his cough one month ago, and it worsened to the point that he presented to the ED a couple days ago.  He does endorse feeling sleepier in the last month, as he wasn't eating as well- which he describes as a change in the last month. He notes that he has lost 20-30 pounds in the last 2-3 months after beginning Mytomycin C. He felt chills and flu-like symptoms for 2 weeks after beginning Mitomycin C.   Of note prior to the patient's visit today, pt has had CT C/A/P completed on 04/26/18 in the ED with results revealing 1. Bilateral pulmonary nodules and masses, right greater than left are associated with right-sided pleural disease and small right pleural effusion. Metastatic disease in this patient with history of  bladder cancer would be a distinct consideration although thoracic primary not excluded. 2. Lower paraesophageal lymphadenopathy 3. Mild circumferential bladder wall thickening with a 10 mm apparent bladder wall nodule posteriorly on the left. 4. No evidence for metastatic disease in the abdomen or pelvis. 5. Cholelithiasis. 6.  Aortic Atherosclerois.   Most recent lab results (04/26/18) of CBC  is as follows: all values are WNL except for Hgb at 10.7, HCT at 36.4, MCH at 24.3, MCHC at 29.4, Monocytes Abs at 1.3k.  On review of systems, pt reports decreased appetite, weight loss, increased fatigue, productive cough, mild back pains, and denies back aches, head aches, fevers, chills, night sweats, changes in vision, difficulty swallowing, and any other symptoms.   On PMHx the pt reports chronic kidney disease, bladder cancer, hypertension, GERD. On Social Hx the pt reports frequent exposure to clorox and he works in Development worker, international aid.    MEDICAL HISTORY:  Past Medical History:  Diagnosis Date  . Cancer [facility])    bladder cancer  . GERD (gastroesophageal reflux disease)   . Hypertension     SURGICAL HISTORY: Past Surgical History:  Procedure Laterality Date  . BLADDER SURGERY    . CYSTOSCOPY N/A 04/06/2017   Procedure: CYSTOSCOPY;  Surgeon: Raynelle Bring, MD;  Location: [facility];  Service: Urology;  Laterality: N/A;  . CYSTOSCOPY W/ RETROGRADES N/A 01/28/2018   Procedure: CYSTOSCOPY WITH RETROGRADE PYELOGRAM;  Surgeon: Raynelle Bring, MD;  Location: [facility];  Service: Urology;  Laterality: N/A;  .  CYSTOSCOPY W/ URETERAL STENT PLACEMENT N/A 10/08/2017   Procedure: CYSTOSCOPY WITH BILATERAL  RETROGRADE PYELOGRAM/ LEFT URETERAL STENT PLACEMENT;  Surgeon: Raynelle Bring, MD;  Location: [facility];  Service: Urology;  Laterality: N/A;  . TRANSURETHRAL RESECTION OF BLADDER TUMOR N/A 04/06/2017   Procedure: TRANSURETHRAL RESECTION OF BLADDER TUMOR (TURBT);  Surgeon: Raynelle Bring, MD;  Location:  [facility];  Service: Urology;  Laterality: N/A;  . TRANSURETHRAL RESECTION OF BLADDER TUMOR N/A 10/08/2017   Procedure: TRANSURETHRAL RESECTION OF BLADDER TUMOR (TURBT);  Surgeon: Raynelle Bring, MD;  Location: [facility];  Service: Urology;  Laterality: N/A;  . TRANSURETHRAL RESECTION OF BLADDER TUMOR N/A 01/28/2018   Procedure: TRANSURETHRAL RESECTION OF BLADDER TUMOR (TURBT);  Surgeon: Raynelle Bring, MD;  Location: [facility];  Service: Urology;  Laterality: N/A;  GENERAL ANESTHESIA WITH PARALYSIS  . TRANSURETHRAL RESECTION OF BLADDER TUMOR WITH MITOMYCIN-C N/A 03/02/2017   Procedure: TRANSURETHRAL RESECTION OF BLADDER TUMOR/ EXAM UNDER ANESTHESIA/ WITH MITOMYCIN-C POST OPERATIVE;  Surgeon: Raynelle Bring, MD;  Location: [facility];  Service: Urology;  Laterality: N/A;    SOCIAL HISTORY: Social History   Socioeconomic History  . Marital status: Married    Spouse name: Not on file  . Number of children: 4  . Years of education: 10  . Highest education level: Not on file  Occupational History  . Occupation: Retired  Scientific laboratory technician  . Financial resource strain: Not on file  . Food insecurity:    Worry: Not on file    Inability: Not on file  . Transportation needs:    Medical: Not on file    Non-medical: Not on file  Tobacco Use  . Smoking status: Former Smoker    Packs/day: 0.15    Years: 10.00    Pack years: 1.50    Types: Cigarettes    Last attempt to quit: 1980    Years since quitting: 39.4  . Smokeless tobacco: Never Used  Substance and Sexual Activity  . Alcohol use: Yes    Comment: occasionally  . Drug use: No    Comment: "years ago"  . Sexual activity: Not on file  Lifestyle  . Physical activity:    Days per week: Not on file    Minutes per session: Not on file  . Stress: Not on file  Relationships  . Social connections:    Talks on phone: Not on file    Gets together: Not on file    Attends religious service: Not on file    Active member of club or organization: Not on file      Attends meetings of clubs or organizations: Not on file    Relationship status: Not on file  . Intimate partner violence:    Fear of current or ex partner: Not on file    Emotionally abused: Not on file    Physically abused: Not on file    Forced sexual activity: Not on file  Other Topics Concern  . Not on file  Social History Narrative   Fun/Hobby: Go to [employer] and travel.     FAMILY HISTORY: Family History  Problem Relation Age of Onset  . Hypertension Mother   . Stroke Mother   . Diabetes Mother   . Cataracts Mother   . Kidney disease Mother   . Hypertension Father   . Stroke Father   . Aneurysm Maternal Grandmother     ALLERGIES:  has No Known Allergies.  MEDICATIONS:  Current Outpatient Medications  Medication Sig Dispense Refill  .  Cyanocobalamin (VITAMIN B 12 PO) Take 1 tablet by mouth daily.    Marland Kitchen diltiazem (CARDIZEM CD) 360 MG 24 hr capsule Take 1 capsule (360 mg total) by mouth daily. Must keep scheduled appt w/new provider for future refills 90 capsule 0  . Ferrous Gluconate (IRON 27 PO) Take 27 mg by mouth daily.    . Glucos-Chondroit-Hyaluron-MSM (GLUCOSAMINE CHONDROITIN JOINT PO) Take 1,500 mg by mouth daily.    Marland Kitchen guaiFENesin-dextromethorphan (ROBITUSSIN DM) 100-10 MG/5ML syrup Take 5 mLs by mouth 3 (three) times daily as needed for cough. 118 mL 0  . ondansetron (ZOFRAN-ODT) 4 MG disintegrating tablet Take 1 tablet (4 mg total) by mouth every 8 (eight) hours as needed for nausea or vomiting. 8 tablet 0  . ranitidine (ZANTAC) 300 MG tablet Take 1 tablet (300 mg total) by mouth every morning. Must keep appt for future refills 90 tablet 0  . sildenafil (VIAGRA) 50 MG tablet Take 1 tablet (50 mg total) by mouth daily as needed for erectile dysfunction. 20 tablet 3   No current facility-administered medications for this visit.     REVIEW OF SYSTEMS:    10 Point review of Systems was done is negative except as noted above.  PHYSICAL EXAMINATION: ECOG  PERFORMANCE STATUS: 1 - Symptomatic but completely ambulatory  . Vitals:   04/28/18 1313  BP: (!) 160/105  Pulse: 82  Resp: 18  Temp: 98.3 F (36.8 C)  SpO2: 97%   Filed Weights   04/28/18 1313  Weight: 192 lb (87.1 kg)   .Body mass index is 26.04 kg/m.  GENERAL:alert, in no acute distress and comfortable SKIN: no acute rashes, no significant lesions EYES: conjunctiva are pink and non-injected, sclera anicteric OROPHARYNX: MMM, no exudates, no oropharyngeal erythema or ulceration NECK: supple, no JVD LYMPH:  no palpable lymphadenopathy in the cervical, axillary or inguinal regions LUNGS: clear to auscultation b/l with normal respiratory effort HEART: regular rate & rhythm ABDOMEN:  normoactive bowel sounds , non tender, not distended. Extremity: no pedal edema PSYCH: alert & oriented x 3 with fluent speech NEURO: no focal motor/sensory deficits  LABORATORY DATA:  I have reviewed the data as listed  . CBC Latest Ref Rng & Units 04/26/2018 04/26/2018 01/28/2018  WBC 4.0 - 10.5 K/uL - 7.3 6.1  Hemoglobin 13.0 - 17.0 g/dL 12.2(L) 10.7(L) 12.2(L)  Hematocrit 39.0 - 52.0 % 36.0(L) 36.4(L) 38.7(L)  Platelets 150 - 400 K/uL - 307 253    . CMP Latest Ref Rng & Units 04/26/2018 04/26/2018 01/28/2018  Glucose 65 - 99 mg/dL 97 96 106(H)  BUN 6 - 20 mg/dL 18 23(H) 13  Creatinine 0.61 - 1.24 mg/dL 1.71(H) 1.60(H) 1.57(H)  Sodium 135 - 145 mmol/L 141 137 141  Potassium 3.5 - 5.1 mmol/L 4.0 6.8(HH) 4.1  Chloride 101 - 111 mmol/L 106 106 107  CO2 22 - 32 mmol/L 26 - 22  Calcium 8.9 - 10.3 mg/dL 8.8(L) - 8.9  Total Protein 6.0 - 8.3 g/dL - - -  Total Bilirubin 0.2 - 1.2 mg/dL - - -  Alkaline Phos 39 - 117 U/L - - -  AST 0 - 37 U/L - - -  ALT 0 - 53 U/L - - -   01/28/18 Bx:    03/02/17 Bx:     RADIOGRAPHIC STUDIES: I have personally reviewed the radiological images as listed and agreed with the findings in the report. Dg Chest 2 View  Result Date: 04/26/2018 CLINICAL DATA:   Cough, shortness of breath EXAM:  CHEST - 2 VIEW COMPARISON:  02/27/2017 FINDINGS: Multiple bilateral pulmonary masses and pulmonary nodules with the largest in the right middle lobe measuring 5 cm. Small right pleural effusion. No pneumothorax. Stable cardiomediastinal silhouette. IMPRESSION: Multiple bilateral pulmonary masses and pulmonary nodules most concerning for metastatic disease. Electronically Signed   By: Kathreen Devoid   On: 04/26/2018 14:09   Ct Chest W Contrast  Result Date: 04/26/2018 CLINICAL DATA:  Bladder cancer with productive cough. EXAM: CT CHEST, ABDOMEN, AND PELVIS WITH CONTRAST TECHNIQUE: Multidetector CT imaging of the chest, abdomen and pelvis was performed following the standard protocol during bolus administration of intravenous contrast. CONTRAST:  76mL ISOVUE-300 IOPAMIDOL (ISOVUE-300) INJECTION 61% COMPARISON:  Chest x-ray from earlier today. FINDINGS: CT CHEST FINDINGS Cardiovascular: The heart size is normal. No pericardial effusion. Atherosclerotic calcification is noted in the wall of the thoracic aorta. Mediastinum/Nodes: Upper normal mediastinal lymph nodes evident. No axillary lymphadenopathy. 13 mm short axis right hilar lymph node is identified. There is a 17 mm lymph node in the inferior right hilum (image 32/2). 2.4 cm short axis lower paraesophageal lymph node visible on image 47/series 2. Lungs/Pleura: The central tracheobronchial airways are patent. Multiple bilateral pulmonary nodules and masses are evident, more prominent in the right hemithorax than the left. Dominant left lung nodule is identified in the lingula and measures 2.3 cm (image 80/series 6). Multiple pulmonary masses are identified in the right lung including 3.2 cm right upper lobe lesion on image 50 and a 4.4 cm right middle lobe mass on image 85. Multiple right pleural nodules are identified in the posterior lower chest in there is a small right pleural effusion. Musculoskeletal: Bone windows reveal  no worrisome lytic or sclerotic osseous lesions. CT ABDOMEN PELVIS FINDINGS Hepatobiliary: Scattered homogeneous, hypoattenuating lesions are identified in the liver measuring up to 15 mm. Larger lesions are most suggestive of cysts. The smaller lesions cannot be definitively characterized. Multiple calcified gallstones measure up to about 13 mm diameter. No intrahepatic or extrahepatic biliary dilation. Pancreas: No focal mass lesion. No dilatation of the main duct. No intraparenchymal cyst. No peripancreatic edema. Spleen: No splenomegaly. No focal mass lesion. Adrenals/Urinary Tract: No adrenal nodule or mass. Kidneys unremarkable. No evidence for hydroureter. Mild circumferential wall thickening noted in the bladder. Subtle 10 mm soft tissue nodule is identified along the posterior left bladder wall, inferior to the left UVJ (see image 116/series 2 and coronal image 67/series 4. Stomach/Bowel: Tiny hiatal hernia. Stomach otherwise unremarkable. Duodenum is normally positioned as is the ligament of Treitz. No small bowel wall thickening. No small bowel dilatation. The terminal ileum is normal. The appendix is normal. Scattered diverticuli are seen in the right and left colon without diverticulitis. Vascular/Lymphatic: There is abdominal aortic atherosclerosis without aneurysm. There is no gastrohepatic or hepatoduodenal ligament lymphadenopathy. No intraperitoneal or retroperitoneal lymphadenopathy. No pelvic sidewall lymphadenopathy. Reproductive: The prostate gland and seminal vesicles have normal imaging features. Other: No intraperitoneal free fluid. Musculoskeletal: Bone windows reveal no worrisome lytic or sclerotic osseous lesions. IMPRESSION: 1. Bilateral pulmonary nodules and masses, right greater than left are associated with right-sided pleural disease and small right pleural effusion. Metastatic disease in this patient with history of bladder cancer would be a distinct consideration although thoracic  primary not excluded. 2. Lower paraesophageal lymphadenopathy 3. Mild circumferential bladder wall thickening with a 10 mm apparent bladder wall nodule posteriorly on the left. 4. No evidence for metastatic disease in the abdomen or pelvis. 5. Cholelithiasis. 6.  Aortic Atherosclerois (ICD10-170.0)  Electronically Signed   By: Misty Stanley M.D.   On: 04/26/2018 15:29   Ct Abdomen Pelvis W Contrast  Result Date: 04/26/2018 CLINICAL DATA:  Bladder cancer with productive cough. EXAM: CT CHEST, ABDOMEN, AND PELVIS WITH CONTRAST TECHNIQUE: Multidetector CT imaging of the chest, abdomen and pelvis was performed following the standard protocol during bolus administration of intravenous contrast. CONTRAST:  68mL ISOVUE-300 IOPAMIDOL (ISOVUE-300) INJECTION 61% COMPARISON:  Chest x-ray from earlier today. FINDINGS: CT CHEST FINDINGS Cardiovascular: The heart size is normal. No pericardial effusion. Atherosclerotic calcification is noted in the wall of the thoracic aorta. Mediastinum/Nodes: Upper normal mediastinal lymph nodes evident. No axillary lymphadenopathy. 13 mm short axis right hilar lymph node is identified. There is a 17 mm lymph node in the inferior right hilum (image 32/2). 2.4 cm short axis lower paraesophageal lymph node visible on image 47/series 2. Lungs/Pleura: The central tracheobronchial airways are patent. Multiple bilateral pulmonary nodules and masses are evident, more prominent in the right hemithorax than the left. Dominant left lung nodule is identified in the lingula and measures 2.3 cm (image 80/series 6). Multiple pulmonary masses are identified in the right lung including 3.2 cm right upper lobe lesion on image 50 and a 4.4 cm right middle lobe mass on image 85. Multiple right pleural nodules are identified in the posterior lower chest in there is a small right pleural effusion. Musculoskeletal: Bone windows reveal no worrisome lytic or sclerotic osseous lesions. CT ABDOMEN PELVIS FINDINGS  Hepatobiliary: Scattered homogeneous, hypoattenuating lesions are identified in the liver measuring up to 15 mm. Larger lesions are most suggestive of cysts. The smaller lesions cannot be definitively characterized. Multiple calcified gallstones measure up to about 13 mm diameter. No intrahepatic or extrahepatic biliary dilation. Pancreas: No focal mass lesion. No dilatation of the main duct. No intraparenchymal cyst. No peripancreatic edema. Spleen: No splenomegaly. No focal mass lesion. Adrenals/Urinary Tract: No adrenal nodule or mass. Kidneys unremarkable. No evidence for hydroureter. Mild circumferential wall thickening noted in the bladder. Subtle 10 mm soft tissue nodule is identified along the posterior left bladder wall, inferior to the left UVJ (see image 116/series 2 and coronal image 67/series 4. Stomach/Bowel: Tiny hiatal hernia. Stomach otherwise unremarkable. Duodenum is normally positioned as is the ligament of Treitz. No small bowel wall thickening. No small bowel dilatation. The terminal ileum is normal. The appendix is normal. Scattered diverticuli are seen in the right and left colon without diverticulitis. Vascular/Lymphatic: There is abdominal aortic atherosclerosis without aneurysm. There is no gastrohepatic or hepatoduodenal ligament lymphadenopathy. No intraperitoneal or retroperitoneal lymphadenopathy. No pelvic sidewall lymphadenopathy. Reproductive: The prostate gland and seminal vesicles have normal imaging features. Other: No intraperitoneal free fluid. Musculoskeletal: Bone windows reveal no worrisome lytic or sclerotic osseous lesions. IMPRESSION: 1. Bilateral pulmonary nodules and masses, right greater than left are associated with right-sided pleural disease and small right pleural effusion. Metastatic disease in this patient with history of bladder cancer would be a distinct consideration although thoracic primary not excluded. 2. Lower paraesophageal lymphadenopathy 3. Mild  circumferential bladder wall thickening with a 10 mm apparent bladder wall nodule posteriorly on the left. 4. No evidence for metastatic disease in the abdomen or pelvis. 5. Cholelithiasis. 6.  Aortic Atherosclerois (ICD10-170.0) Electronically Signed   By: Misty Stanley M.D.   On: 04/26/2018 15:29    ASSESSMENT & PLAN:  72 y.o. male with  1. B/l Pulmonary metastatic lesions with unknown primary. Given his h/o recurrent bladder cancer with one specimen showing possible focal  muscularis invasion - metastatic bladder cancer would be at the top of the possibilities. 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  Cannot r/o metastatic lung primary or other metastatic tumor. Less likely infectious etiology like fungal infections.  2. Intractable cough from pulmonary metastases PLAN  -Discussed patient's most recent labs from 04/26/18, mild anemia with Hgb a 12.2, Platelets are WNL at 253k.  -Discused 04/26/18 CT C/A/P which revealed Bilateral pulmonary nodules and masses, right greater than left are associated with right-sided pleural disease and small right pleural effusion.  -CXR 02/27/17 did not reveal any lung masses -Pathology from March 2018 showed a small cell component and invasion into the urinary bladder muscle -Will request IR to help with CT guided lung bx of right lower lobe- close to pleural or other accessible lung lesion. -Will prescribe Tessalon pearls for cough suppression - PET/CT for initial evaluation of metastatic malignancy with unknown primary. -Will refill Zofran    PET/CT in 5 days CT biopsy of RLL lung lesions ASAP RTC with Dr Irene Limbo in 10 days with labs   All of the patients questions were answered with apparent satisfaction. The patient knows to call the clinic with any problems, questions or concerns.  The toal time spent in the appt was 60 minutes and more than 50% was  on counseling and direct patient cares.    Sullivan Lone MD [place] AAHIVMS [facility] Hematology/Oncology Physician [facility]  (Office):       [phone] (Work cell):  [phone] (Fax):           [phone]  04/28/2018 1:37 PM  This document serves as a record of services personally performed by Sullivan Lone, MD. It was created on his behalf by Baldwin Jamaica, a trained medical scribe. The creation of this record is based on the scribe's personal observations and the provider's statements to them.   .I have reviewed the above documentation for accuracy and completeness, and I agree with the above. Brunetta Genera MD MS

## 2018-04-28 NOTE — Patient Instructions (Signed)
Thank you for choosing [facility] to provide your oncology and hematology care.  To afford each patient quality time with our providers, please arrive 30 minutes before your scheduled appointment time.  If you arrive late for your appointment, you may be asked to reschedule.  We strive to give you quality time with our providers, and arriving late affects you and other patients whose appointments are after yours.   If you are a no show for multiple scheduled visits, you may be dismissed from the clinic at the providers discretion.    Again, thank you for choosing [facility], our hope is that these requests will decrease the amount of time that you wait before being seen by our physicians.  ______________________________________________________________________  Should you have questions after your visit to the [facility], please contact our office at [phone] between the hours of 8:30 and 4:30 p.m.    Voicemails left after 4:30p.m will not be returned until the following business day.    For prescription refill requests, please have your pharmacy contact us directly.  Please also try to allow 48 hours for prescription requests.    Please contact the scheduling department for questions regarding scheduling.  For scheduling of procedures such as PET scans, CT scans, MRI, Ultrasound, etc please contact central scheduling at [phone].    Resources For Cancer Patients and Caregivers:   Oncolink.org:  A wonderful resource for patients and healthcare providers for information regarding your disease, ways to tract your treatment, what to expect, etc.     American Cancer Society:  800-227-2345  Can help patients locate various types of support and financial assistance  Cancer Care: 1-800-813-HOPE (4673) Provides financial assistance, online support groups, medication/co-pay assistance.    [place] DSS:  336-641-3447 Where to apply for food  stamps, Medicaid, and utility assistance  Medicare Rights Center: 800-333-4114 Helps people with Medicare understand their rights and benefits, navigate the Medicare system, and secure the quality healthcare they deserve  SCAT: 336-333-6589 [place] [employer]'s shared-ride transportation service for eligible riders who have a disability that prevents them from riding the fixed route bus.    For additional information on assistance programs please contact our social worker:   Grier Hock/Abigail Elmore:  [phone]            

## 2018-04-28 NOTE — Telephone Encounter (Signed)
Per Dr. Irene Limbo, urology notes requested from Dr. Alinda Money at [facility] urology.  Per office, release of information requested.  Release signed by patient and faxed to provided fax number of [phone].  Fax confirmation received.

## 2018-04-29 LAB — I-STAT CHEM 8, ED
BUN: 23 mg/dL — ABNORMAL HIGH (ref 6–20)
Calcium, Ion: 0.99 mmol/L — ABNORMAL LOW (ref 1.15–1.40)
Chloride: 106 mmol/L (ref 101–111)
Creatinine, Ser: 1.6 mg/dL — ABNORMAL HIGH (ref 0.61–1.24)
Glucose, Bld: 96 mg/dL (ref 65–99)
HCT: 36 % — ABNORMAL LOW (ref 39.0–52.0)
Hemoglobin: 12.2 g/dL — ABNORMAL LOW (ref 13.0–17.0)
Potassium: 6.8 mmol/L (ref 3.5–5.1)
Sodium: 137 mmol/L (ref 135–145)
TCO2: 26 mmol/L (ref 22–32)

## 2018-04-30 ENCOUNTER — Other Ambulatory Visit: Payer: Self-pay | Admitting: *Deleted

## 2018-04-30 MED ORDER — DILTIAZEM HCL ER COATED BEADS 360 MG PO CP24: 360.0000 mg | ORAL_CAPSULE | Freq: Every day | ORAL | 0 refills | Status: DC

## 2018-04-30 MED ORDER — RANITIDINE HCL 300 MG PO TABS: 300.0000 mg | ORAL_TABLET | ORAL | 0 refills | Status: DC

## 2018-05-04 ENCOUNTER — Telehealth: Payer: Self-pay | Admitting: Family Medicine

## 2018-05-04 NOTE — Telephone Encounter (Signed)
Copied from [place] [id number]. Topic: Quick Communication - Rx Refill/Question >> May 04, 2018  2:40 PM Yvette Rack wrote: Medication: sildenafil (VIAGRA) 50 MG tablet  Has the patient contacted their pharmacy? No they never used this pharmacy (Agent: If no, request that the patient contact the pharmacy for the refill.) (Agent: If yes, when and what did the pharmacy advise?)  Preferred Pharmacy (with phone number or street name): [facility] ([address] [place] - [employer] [phone] (Phone) [phone] (Fax)      Agent: Please be advised that RX refills may take up to 3 business days. We ask that you follow-up with your pharmacy.

## 2018-05-05 ENCOUNTER — Telehealth: Payer: Self-pay

## 2018-05-05 ENCOUNTER — Other Ambulatory Visit: Payer: Self-pay | Admitting: *Deleted

## 2018-05-05 ENCOUNTER — Other Ambulatory Visit: Payer: Self-pay | Admitting: Radiology

## 2018-05-05 ENCOUNTER — Inpatient Hospital Stay: Payer: Medicare Other | Admitting: Hematology

## 2018-05-05 ENCOUNTER — Inpatient Hospital Stay: Payer: Medicare Other

## 2018-05-05 MED ORDER — LISINOPRIL 40 MG PO TABS: 40.0000 mg | ORAL_TABLET | Freq: Every day | ORAL | 3 refills | Status: DC

## 2018-05-05 NOTE — Telephone Encounter (Signed)
Pt has biopsy and PET scheduled in the next week. F/U with Dr. Irene Limbo to be postponed to 6/5. Scheduling message sent to schedule pt on 6/5 for labs and doctor visit. Appointments today cancelled per physician.

## 2018-05-05 NOTE — Telephone Encounter (Signed)
Left message for patient to phone back to speak with a nurse regarding his refill request for Sildenafil 50 MG. It was refilled on 04/20/18 for 20 tabs with 3 refills sent to [facility] He is requesting it be sent to local pharmacy. It is too early for refill.

## 2018-05-06 ENCOUNTER — Ambulatory Visit
Admission: RE | Admit: 2018-05-06 | Discharge: 2018-05-06 | Disposition: A | Discharge status: Home / Self Care | Payer: Medicare Other | Source: Ambulatory Visit | Attending: Hematology | Admitting: Hematology

## 2018-05-06 DIAGNOSIS — C799 Secondary malignant neoplasm of unspecified site: Secondary | ICD-10-CM

## 2018-05-06 DIAGNOSIS — C801 Malignant (primary) neoplasm, unspecified: Secondary | ICD-10-CM | POA: Diagnosis present

## 2018-05-06 DIAGNOSIS — J948 Other specified pleural conditions: Secondary | ICD-10-CM | POA: Diagnosis not present

## 2018-05-06 DIAGNOSIS — C7800 Secondary malignant neoplasm of unspecified lung: Secondary | ICD-10-CM

## 2018-05-06 DIAGNOSIS — C679 Malignant neoplasm of bladder, unspecified: Secondary | ICD-10-CM | POA: Insufficient documentation

## 2018-05-06 DIAGNOSIS — C782 Secondary malignant neoplasm of pleura: Secondary | ICD-10-CM | POA: Diagnosis not present

## 2018-05-06 LAB — CBC
HCT: 34.2 % — ABNORMAL LOW (ref 40.0–52.0)
Hemoglobin: 11.2 g/dL — ABNORMAL LOW (ref 13.0–18.0)
MCH: 26.1 pg (ref 26.0–34.0)
MCHC: 32.7 g/dL (ref 32.0–36.0)
MCV: 79.8 fL — ABNORMAL LOW (ref 80.0–100.0)
Platelets: 351 10*3/uL (ref 150–440)
RBC: 4.28 MIL/uL — ABNORMAL LOW (ref 4.40–5.90)
RDW: 15.1 % — ABNORMAL HIGH (ref 11.5–14.5)
WBC: 6.2 10*3/uL (ref 3.8–10.6)

## 2018-05-06 LAB — APTT: aPTT: 39 seconds — ABNORMAL HIGH (ref 24–36)

## 2018-05-06 LAB — PROTIME-INR
INR: 1.06
Prothrombin Time: 13.7 seconds (ref 11.4–15.2)

## 2018-05-06 MED ORDER — MIDAZOLAM HCL 5 MG/5ML IJ SOLN
INTRAMUSCULAR | Status: AC | PRN
  Administered 2018-05-06: 1 mg via INTRAVENOUS

## 2018-05-06 MED ORDER — FENTANYL CITRATE (PF) 100 MCG/2ML IJ SOLN
INTRAMUSCULAR | Status: AC
  Filled 2018-05-06: qty 4

## 2018-05-06 MED ORDER — SODIUM CHLORIDE 0.9 % IV SOLN
INTRAVENOUS | Status: DC
  Administered 2018-05-06: 10:00:00 via INTRAVENOUS

## 2018-05-06 MED ORDER — MIDAZOLAM HCL 5 MG/5ML IJ SOLN
INTRAMUSCULAR | Status: AC
  Filled 2018-05-06: qty 5

## 2018-05-06 MED ORDER — LIDOCAINE HCL (PF) 1 % IJ SOLN
INTRAMUSCULAR | Status: AC | PRN
  Administered 2018-05-06: 8 mL

## 2018-05-06 MED ORDER — FENTANYL CITRATE (PF) 100 MCG/2ML IJ SOLN
INTRAMUSCULAR | Status: AC | PRN
  Administered 2018-05-06: 50 ug via INTRAVENOUS

## 2018-05-06 NOTE — Procedures (Signed)
Pre procedural Dx: Pleural mass  Post procedural Dx: Same  Technically successful CT guided biopsy of indeterminate mass within the posterior basilar aspect of the right pleura.   EBL: None.   Complications: None immediate.   Ronny Bacon, MD Pager #: [phone]

## 2018-05-06 NOTE — Discharge Instructions (Signed)
Needle Biopsy, Care After °Refer to this sheet in the next few weeks. These instructions provide you with information about caring for yourself after your procedure. Your health care provider may also give you more specific instructions. Your treatment has been planned according to current medical practices, but problems sometimes occur. Call your health care provider if you have any problems or questions after your procedure. °What can I expect after the procedure? °After your procedure, it is common to have soreness, bruising, or mild pain at the biopsy site. This should go away in a few days. °Follow these instructions at home: °· Rest as directed by your health care provider. °· Take medicines only as directed by your health care provider. °· There are many different ways to close and cover the biopsy site, including stitches (sutures), skin glue, and adhesive strips. Follow your health care provider's instructions about: °? Biopsy site care. °? Bandage (dressing) changes and removal. °? Biopsy site closure removal. °· Check your biopsy site every day for signs of infection. Watch for: °? Redness, swelling, or pain. °? Fluid, blood, or pus. °Contact a health care provider if: °· You have a fever. °· You have redness, swelling, or pain at the biopsy site that lasts longer than a few days. °· You have fluid, blood, or pus coming from the biopsy site. °· You feel nauseous. °· You vomit. °Get help right away if: °· You have shortness of breath. °· You have trouble breathing. °· You have chest pain. °· You feel dizzy or you faint. °· You have bleeding that does not stop with pressure or a bandage. °· You cough up blood. °· You have pain in your abdomen. °This information is not intended to replace advice given to you by your health care provider. Make sure you discuss any questions you have with your health care provider. °Document Released: 04/10/2015 Document Revised: 05/01/2016 Document Reviewed:  11/20/2014 °Elsevier Interactive Patient Education © 2018 [employer] ° °

## 2018-05-06 NOTE — Consult Note (Signed)
Chief Complaint: Pleural Mass  Referring Physician(s): Brunetta Genera (Oncology) Alinda Money (Urology)  Patient Status: [facility] - Out-pt  History of Present Illness: Jeremy Patrick is a 72 y.o. male with past medical history significant for hypertension, GERD and most notably bladder cancer who was found to have right-sided pulmonary nodules and masses, hilar lymphadenopathy and pleural base nodules and masses on CT of the chest, abdomen and pelvis performed for the work-up of cough on 04/26/2018.  Patient presents today for CT-guided pleural mass biopsy.  He is accompanied by his wife though serves as his own historian.  Patient states that his cough is unchanged to minimally improved.  He denies hemoptysis.  No shortness of breath.    He complains of mild right-sided flank pain.  No fever or chills.  No definitive chest pain.  Past Medical History:  Diagnosis Date  . Cancer [facility])    bladder cancer  . GERD (gastroesophageal reflux disease)   . Hypertension     Past Surgical History:  Procedure Laterality Date  . BLADDER SURGERY    . CYSTOSCOPY N/A 04/06/2017   Procedure: CYSTOSCOPY;  Surgeon: Raynelle Bring, MD;  Location: [facility];  Service: Urology;  Laterality: N/A;  . CYSTOSCOPY W/ RETROGRADES N/A 01/28/2018   Procedure: CYSTOSCOPY WITH RETROGRADE PYELOGRAM;  Surgeon: Raynelle Bring, MD;  Location: [facility];  Service: Urology;  Laterality: N/A;  . CYSTOSCOPY W/ URETERAL STENT PLACEMENT N/A 10/08/2017   Procedure: CYSTOSCOPY WITH BILATERAL  RETROGRADE PYELOGRAM/ LEFT URETERAL STENT PLACEMENT;  Surgeon: Raynelle Bring, MD;  Location: [facility];  Service: Urology;  Laterality: N/A;  . TRANSURETHRAL RESECTION OF BLADDER TUMOR N/A 04/06/2017   Procedure: TRANSURETHRAL RESECTION OF BLADDER TUMOR (TURBT);  Surgeon: Raynelle Bring, MD;  Location: [facility];  Service: Urology;  Laterality: N/A;  . TRANSURETHRAL RESECTION OF BLADDER TUMOR N/A 10/08/2017   Procedure: TRANSURETHRAL RESECTION OF  BLADDER TUMOR (TURBT);  Surgeon: Raynelle Bring, MD;  Location: [facility];  Service: Urology;  Laterality: N/A;  . TRANSURETHRAL RESECTION OF BLADDER TUMOR N/A 01/28/2018   Procedure: TRANSURETHRAL RESECTION OF BLADDER TUMOR (TURBT);  Surgeon: Raynelle Bring, MD;  Location: [facility];  Service: Urology;  Laterality: N/A;  GENERAL ANESTHESIA WITH PARALYSIS  . TRANSURETHRAL RESECTION OF BLADDER TUMOR WITH MITOMYCIN-C N/A 03/02/2017   Procedure: TRANSURETHRAL RESECTION OF BLADDER TUMOR/ EXAM UNDER ANESTHESIA/ WITH MITOMYCIN-C POST OPERATIVE;  Surgeon: Raynelle Bring, MD;  Location: [facility];  Service: Urology;  Laterality: N/A;    Allergies: Patient has no known allergies.  Medications: Prior to Admission medications   Medication Sig Start Date End Date Taking? Authorizing Provider  Cyanocobalamin (VITAMIN B 12 PO) Take 1 tablet by mouth daily.   Yes [provider]  diltiazem (CARDIZEM CD) 360 MG 24 hr capsule Take 1 capsule (360 mg total) by mouth daily. Must keep scheduled appt w/new provider for future refills 04/30/18  Yes Martinique, Betty G, MD  Ferrous Gluconate (IRON 27 PO) Take 27 mg by mouth daily.   Yes [provider]  Glucos-Chondroit-Hyaluron-MSM (GLUCOSAMINE CHONDROITIN JOINT PO) Take 1,500 mg by mouth daily.   Yes [provider]  lisinopril (PRINIVIL,ZESTRIL) 40 MG tablet Take 1 tablet (40 mg total) by mouth daily. 05/05/18  Yes Martinique, Betty G, MD  ondansetron (ZOFRAN-ODT) 4 MG disintegrating tablet Take 1 tablet (4 mg total) by mouth every 8 (eight) hours as needed for nausea or vomiting. 04/28/18  Yes Brunetta Genera, MD  ranitidine (ZANTAC) 300 MG tablet Take 1 tablet (300 mg total)  by mouth every morning. Must keep appt for future refills 04/30/18  Yes [place], Betty G, MD  benzonatate (TESSALON) 100 MG capsule Take 1 capsule (100 mg total) by mouth 3 (three) times daily as needed for cough. Patient not taking: Reported on 05/06/2018 04/28/18   Brunetta Genera,  MD  guaiFENesin-dextromethorphan [facility] DM) 100-10 MG/5ML syrup Take 5 mLs by mouth 3 (three) times daily as needed for cough. Patient not taking: Reported on 05/06/2018 04/26/18   Davonna Belling, MD  sildenafil (VIAGRA) 50 MG tablet Take 1 tablet (50 mg total) by mouth daily as needed for erectile dysfunction. Patient not taking: Reported on 05/06/2018 04/20/18   [place], Betty G, MD     Family History  Problem Relation Age of Onset  . Hypertension Mother   . Stroke Mother   . Diabetes Mother   . Cataracts Mother   . Kidney disease Mother   . Hypertension Father   . Stroke Father   . Aneurysm Maternal Grandmother     Social History   Socioeconomic History  . Marital status: Married    Spouse name: Not on file  . Number of children: 4  . Years of education: 10  . Highest education level: Not on file  Occupational History  . Occupation: Retired  Scientific laboratory technician  . Financial resource strain: Not on file  . Food insecurity:    Worry: Not on file    Inability: Not on file  . Transportation needs:    Medical: Not on file    Non-medical: Not on file  Tobacco Use  . Smoking status: Former Smoker    Packs/day: 0.15    Years: 10.00    Pack years: 1.50    Types: Cigarettes    Last attempt to quit: 1980    Years since quitting: 39.4  . Smokeless tobacco: Never Used  Substance and Sexual Activity  . Alcohol use: Not Currently  . Drug use: No    Comment: "years ago"  . Sexual activity: Not on file  Lifestyle  . Physical activity:    Days per week: Not on file    Minutes per session: Not on file  . Stress: Not on file  Relationships  . Social connections:    Talks on phone: Not on file    Gets together: Not on file    Attends religious service: Not on file    Active member of club or organization: Not on file    Attends meetings of clubs or organizations: Not on file    Relationship status: Not on file  Other Topics Concern  . Not on file  Social History Narrative     Fun/Hobby: Go to [employer] and travel.     ECOG Status: 1 - Symptomatic but completely ambulatory  Review of Systems: A 12 point ROS discussed and pertinent positives are indicated in the HPI above.  All other systems are negative.  Review of Systems  Constitutional: Negative for activity change, chills, fatigue, fever and unexpected weight change.  Respiratory: Positive for cough. Negative for shortness of breath.   Cardiovascular: Negative.   Genitourinary: Positive for flank pain.    Vital Signs: BP (!) 144/84   Pulse 70   Temp 98.9 F (37.2 C)   Resp 17   SpO2 98%   Physical Exam  Constitutional: He appears well-developed and well-nourished.  HENT:  Head: Normocephalic and atraumatic.  Cardiovascular: Normal rate and regular rhythm.  Pulmonary/Chest: Effort normal and breath sounds normal.  Psychiatric: He has a normal mood and affect. His behavior is normal.    Imaging: Dg Chest 2 View  Result Date: 04/26/2018 CLINICAL DATA:  Cough, shortness of breath EXAM: CHEST - 2 VIEW COMPARISON:  02/27/2017 FINDINGS: Multiple bilateral pulmonary masses and pulmonary nodules with the largest in the right middle lobe measuring 5 cm. Small right pleural effusion. No pneumothorax. Stable cardiomediastinal silhouette. IMPRESSION: Multiple bilateral pulmonary masses and pulmonary nodules most concerning for metastatic disease. Electronically Signed   By: Kathreen Devoid   On: 04/26/2018 14:09   Ct Chest W Contrast  Result Date: 04/26/2018 CLINICAL DATA:  Bladder cancer with productive cough. EXAM: CT CHEST, ABDOMEN, AND PELVIS WITH CONTRAST TECHNIQUE: Multidetector CT imaging of the chest, abdomen and pelvis was performed following the standard protocol during bolus administration of intravenous contrast. CONTRAST:  46mL ISOVUE-300 IOPAMIDOL (ISOVUE-300) INJECTION 61% COMPARISON:  Chest x-ray from earlier today. FINDINGS: CT CHEST FINDINGS Cardiovascular: The heart size is normal. No  pericardial effusion. Atherosclerotic calcification is noted in the wall of the thoracic aorta. Mediastinum/Nodes: Upper normal mediastinal lymph nodes evident. No axillary lymphadenopathy. 13 mm short axis right hilar lymph node is identified. There is a 17 mm lymph node in the inferior right hilum (image 32/2). 2.4 cm short axis lower paraesophageal lymph node visible on image 47/series 2. Lungs/Pleura: The central tracheobronchial airways are patent. Multiple bilateral pulmonary nodules and masses are evident, Patrick prominent in the right hemithorax than the left. Dominant left lung nodule is identified in the lingula and measures 2.3 cm (image 80/series 6). Multiple pulmonary masses are identified in the right lung including 3.2 cm right upper lobe lesion on image 50 and a 4.4 cm right middle lobe mass on image 85. Multiple right pleural nodules are identified in the posterior lower chest in there is a small right pleural effusion. Musculoskeletal: Bone windows reveal no worrisome lytic or sclerotic osseous lesions. CT ABDOMEN PELVIS FINDINGS Hepatobiliary: Scattered homogeneous, hypoattenuating lesions are identified in the liver measuring up to 15 mm. Larger lesions are most suggestive of cysts. The smaller lesions cannot be definitively characterized. Multiple calcified gallstones measure up to about 13 mm diameter. No intrahepatic or extrahepatic biliary dilation. Pancreas: No focal mass lesion. No dilatation of the main duct. No intraparenchymal cyst. No peripancreatic edema. Spleen: No splenomegaly. No focal mass lesion. Adrenals/Urinary Tract: No adrenal nodule or mass. Kidneys unremarkable. No evidence for hydroureter. Mild circumferential wall thickening noted in the bladder. Subtle 10 mm soft tissue nodule is identified along the posterior left bladder wall, inferior to the left UVJ (see image 116/series 2 and coronal image 67/series 4. Stomach/Bowel: Tiny hiatal hernia. Stomach otherwise unremarkable.  Duodenum is normally positioned as is the ligament of Treitz. No small bowel wall thickening. No small bowel dilatation. The terminal ileum is normal. The appendix is normal. Scattered diverticuli are seen in the right and left colon without diverticulitis. Vascular/Lymphatic: There is abdominal aortic atherosclerosis without aneurysm. There is no gastrohepatic or hepatoduodenal ligament lymphadenopathy. No intraperitoneal or retroperitoneal lymphadenopathy. No pelvic sidewall lymphadenopathy. Reproductive: The prostate gland and seminal vesicles have normal imaging features. Other: No intraperitoneal free fluid. Musculoskeletal: Bone windows reveal no worrisome lytic or sclerotic osseous lesions. IMPRESSION: 1. Bilateral pulmonary nodules and masses, right greater than left are associated with right-sided pleural disease and small right pleural effusion. Metastatic disease in this patient with history of bladder cancer would be a distinct consideration although thoracic primary not excluded. 2. Lower paraesophageal lymphadenopathy 3. Mild circumferential bladder  wall thickening with a 10 mm apparent bladder wall nodule posteriorly on the left. 4. No evidence for metastatic disease in the abdomen or pelvis. 5. Cholelithiasis. 6.  Aortic Atherosclerois (ICD10-170.0) Electronically Signed   By: Misty Stanley M.D.   On: 04/26/2018 15:29   Ct Abdomen Pelvis W Contrast  Result Date: 04/26/2018 CLINICAL DATA:  Bladder cancer with productive cough. EXAM: CT CHEST, ABDOMEN, AND PELVIS WITH CONTRAST TECHNIQUE: Multidetector CT imaging of the chest, abdomen and pelvis was performed following the standard protocol during bolus administration of intravenous contrast. CONTRAST:  43mL ISOVUE-300 IOPAMIDOL (ISOVUE-300) INJECTION 61% COMPARISON:  Chest x-ray from earlier today. FINDINGS: CT CHEST FINDINGS Cardiovascular: The heart size is normal. No pericardial effusion. Atherosclerotic calcification is noted in the wall of the  thoracic aorta. Mediastinum/Nodes: Upper normal mediastinal lymph nodes evident. No axillary lymphadenopathy. 13 mm short axis right hilar lymph node is identified. There is a 17 mm lymph node in the inferior right hilum (image 32/2). 2.4 cm short axis lower paraesophageal lymph node visible on image 47/series 2. Lungs/Pleura: The central tracheobronchial airways are patent. Multiple bilateral pulmonary nodules and masses are evident, Patrick prominent in the right hemithorax than the left. Dominant left lung nodule is identified in the lingula and measures 2.3 cm (image 80/series 6). Multiple pulmonary masses are identified in the right lung including 3.2 cm right upper lobe lesion on image 50 and a 4.4 cm right middle lobe mass on image 85. Multiple right pleural nodules are identified in the posterior lower chest in there is a small right pleural effusion. Musculoskeletal: Bone windows reveal no worrisome lytic or sclerotic osseous lesions. CT ABDOMEN PELVIS FINDINGS Hepatobiliary: Scattered homogeneous, hypoattenuating lesions are identified in the liver measuring up to 15 mm. Larger lesions are most suggestive of cysts. The smaller lesions cannot be definitively characterized. Multiple calcified gallstones measure up to about 13 mm diameter. No intrahepatic or extrahepatic biliary dilation. Pancreas: No focal mass lesion. No dilatation of the main duct. No intraparenchymal cyst. No peripancreatic edema. Spleen: No splenomegaly. No focal mass lesion. Adrenals/Urinary Tract: No adrenal nodule or mass. Kidneys unremarkable. No evidence for hydroureter. Mild circumferential wall thickening noted in the bladder. Subtle 10 mm soft tissue nodule is identified along the posterior left bladder wall, inferior to the left UVJ (see image 116/series 2 and coronal image 67/series 4. Stomach/Bowel: Tiny hiatal hernia. Stomach otherwise unremarkable. Duodenum is normally positioned as is the ligament of Treitz. No small bowel  wall thickening. No small bowel dilatation. The terminal ileum is normal. The appendix is normal. Scattered diverticuli are seen in the right and left colon without diverticulitis. Vascular/Lymphatic: There is abdominal aortic atherosclerosis without aneurysm. There is no gastrohepatic or hepatoduodenal ligament lymphadenopathy. No intraperitoneal or retroperitoneal lymphadenopathy. No pelvic sidewall lymphadenopathy. Reproductive: The prostate gland and seminal vesicles have normal imaging features. Other: No intraperitoneal free fluid. Musculoskeletal: Bone windows reveal no worrisome lytic or sclerotic osseous lesions. IMPRESSION: 1. Bilateral pulmonary nodules and masses, right greater than left are associated with right-sided pleural disease and small right pleural effusion. Metastatic disease in this patient with history of bladder cancer would be a distinct consideration although thoracic primary not excluded. 2. Lower paraesophageal lymphadenopathy 3. Mild circumferential bladder wall thickening with a 10 mm apparent bladder wall nodule posteriorly on the left. 4. No evidence for metastatic disease in the abdomen or pelvis. 5. Cholelithiasis. 6.  Aortic Atherosclerois (ICD10-170.0) Electronically Signed   By: Misty Stanley M.D.   On: 04/26/2018 15:29  Labs:  CBC: Recent Labs    10/05/17 1436 01/28/18 0931 04/26/18 1232 04/26/18 1239 05/06/18 0729  WBC 6.3 6.1 7.3  --  6.2  HGB 13.3 12.2* 10.7* 12.2* 11.2*  HCT 40.0 38.7* 36.4* 36.0* 34.2*  PLT 225 253 307  --  351    COAGS: Recent Labs    05/06/18 0729  INR 1.06  APTT 39*    BMP: Recent Labs    10/05/17 1436 01/28/18 0931 04/26/18 1239 04/26/18 1258  NA 142 141 137 141  K 4.3 4.1 6.8* 4.0  CL 103 107 106 106  CO2 29 22  --  26  GLUCOSE 89 106* 96 97  BUN 17 13 23* 18  CALCIUM 9.3 8.9  --  8.8*  CREATININE 1.43* 1.57* 1.60* 1.71*  GFRNONAA 48* 43*  --  39*  GFRAA 56* 50*  --  45*    LIVER FUNCTION TESTS: No  results for input(s): BILITOT, AST, ALT, ALKPHOS, PROT, ALBUMIN in the last 8760 hours.  TUMOR MARKERS: No results for input(s): AFPTM, CEA, CA199, CHROMGRNA in the last 8760 hours.  Assessment and Plan:  Jeremy Patrick is a 72 y.o. male with past medical history significant for hypertension, GERD and most notably bladder cancer who was found to have right-sided pulmonary nodules and masses, hilar lymphadenopathy and pleural base nodules and masses on CT of the chest, abdomen and pelvis performed for the work-up of cough on 04/26/2018.  Patient presents today for CT-guided pleural mass biopsy.    Risks and benefits of CT guided pleural mass biopsy was discussed with the patient including, but not limited to bleeding, hemoptysis, respiratory failure requiring intubation, infection, pneumothorax requiring chest tube placement, stroke from air embolism or even death.  All of the patient's questions were answered, patient is agreeable to proceed.  Consent signed and in chart.  Thank you for this interesting consult.  I greatly enjoyed meeting Jeremy Patrick and look forward to participating in their care.  A copy of this report was sent to the requesting provider on this date.  Electronically Signed: Sandi Mariscal, MD 05/06/2018, 8:12 AM   I spent a total of 15 Minutes in face to face in clinical consultation, greater than 50% of which was counseling/coordinating care for CT guided pleural mass biopsy.

## 2018-05-07 ENCOUNTER — Telehealth: Payer: Self-pay

## 2018-05-07 NOTE — Telephone Encounter (Signed)
Left a msg of the upcoming appointment, and mailed letter with calender enclosed. Per 5/29 sch msg

## 2018-05-10 ENCOUNTER — Ambulatory Visit (HOSPITAL_COMMUNITY)
Admission: RE | Admit: 2018-05-10 | Discharge: 2018-05-10 | Disposition: A | Discharge status: Home / Self Care | Payer: Medicare Other | Source: Ambulatory Visit | Attending: Hematology | Admitting: Hematology

## 2018-05-10 DIAGNOSIS — C7801 Secondary malignant neoplasm of right lung: Secondary | ICD-10-CM | POA: Diagnosis not present

## 2018-05-10 DIAGNOSIS — C7802 Secondary malignant neoplasm of left lung: Secondary | ICD-10-CM | POA: Insufficient documentation

## 2018-05-10 DIAGNOSIS — C771 Secondary and unspecified malignant neoplasm of intrathoracic lymph nodes: Secondary | ICD-10-CM | POA: Insufficient documentation

## 2018-05-10 DIAGNOSIS — C7989 Secondary malignant neoplasm of other specified sites: Secondary | ICD-10-CM | POA: Diagnosis not present

## 2018-05-10 DIAGNOSIS — C799 Secondary malignant neoplasm of unspecified site: Secondary | ICD-10-CM

## 2018-05-10 DIAGNOSIS — C801 Malignant (primary) neoplasm, unspecified: Secondary | ICD-10-CM | POA: Diagnosis not present

## 2018-05-10 LAB — GLUCOSE, CAPILLARY: Glucose-Capillary: 96 mg/dL (ref 65–99)

## 2018-05-10 LAB — SURGICAL PATHOLOGY

## 2018-05-10 MED ORDER — FLUDEOXYGLUCOSE F - 18 (FDG) INJECTION
9.6000 | Freq: Once | INTRAVENOUS | Status: AC | PRN
  Administered 2018-05-10: 9.6 via INTRAVENOUS

## 2018-05-11 NOTE — Progress Notes (Signed)
HEMATOLOGY/ONCOLOGY CONSULTATION NOTE  Date of Service: 05/12/2018  Patient Care Team: Martinique, Betty G, MD as PCP - General (Family Medicine)  Raynelle Bring, MD as Urologist  CHIEF COMPLAINTS/PURPOSE OF CONSULTATION:  F/u for newly diagnosed Metastatic Urothelial bladder carcinoma   HISTORY OF PRESENTING ILLNESS:   Jeremy Patrick is a wonderful 72 y.o. male who has been referred to [place] by Dr Betty Martinique for evaluation and management of metastatic malignancy of unknown primary. He is accompanied today by his wife. The pt reports that he is doing well overall.   The pt reports that he first noticed hematuria two years ago. He has had multiple bladder tumors removed since then, with the last resection in February 2019. He denies knowledge of metastasis and has been followed by urology and hasn't been seen by medical oncology previously.  He notes that his hematuria has resolved. His last treatment with intravesical Mitomycin C was 2-3 months ago, soon after his last tumor resection.   He presented to the ED on 04/26/18 because he was having intractable coughing. He notes that he would cough so much that he began to throw up. He denies coughing up any blood and has brought up some clear phlegm. He first noticed his cough one month ago, and it worsened to the point that he presented to the ED a couple days ago.  He does endorse feeling sleepier in the last month, as he wasn't eating as well- which he describes as a change in the last month. He notes that he has lost 20-30 pounds in the last 2-3 months after beginning Mytomycin C. He felt chills and flu-like symptoms for 2 weeks after beginning Mitomycin C.   Of note prior to the patient's visit today, pt has had CT C/A/P completed on 04/26/18 in the ED with results revealing 1. Bilateral pulmonary nodules and masses, right greater than left are associated with right-sided pleural disease and small right pleural effusion. Metastatic disease in this  patient with history of bladder cancer would be a distinct consideration although thoracic primary not excluded. 2. Lower paraesophageal lymphadenopathy 3. Mild circumferential bladder wall thickening with a 10 mm apparent bladder wall nodule posteriorly on the left. 4. No evidence for metastatic disease in the abdomen or pelvis. 5. Cholelithiasis. 6.  Aortic Atherosclerois.   Most recent lab results (04/26/18) of CBC  is as follows: all values are WNL except for Hgb at 10.7, HCT at 36.4, MCH at 24.3, MCHC at 29.4, Monocytes Abs at 1.3k.  On review of systems, pt reports decreased appetite, weight loss, increased fatigue, productive cough, mild back pains, and denies back aches, head aches, fevers, chills, night sweats, changes in vision, difficulty swallowing, and any other symptoms.   On PMHx the pt reports chronic kidney disease, bladder cancer, hypertension, GERD. On Social Hx the pt reports frequent exposure to clorox and he works in Development worker, international aid.    INTERVAL HISTORY   Jeremy Patrick is here for follow up and discuss his PET scan and Biopsy. He presents to the clinic today accompanied by his family member. He notes he still coughs but uses cough syrup to help. He is retired and gets Fish farm manager.  On review of symptoms, pt denies SOB or trouble breathing. He notes occasional chest pain when laying down and he still has productive cough.   We discussed the PET/CT, biopsy results, diagnosis, prognosis and treatment options in details in including goals of treatment being palliative.  MEDICAL  HISTORY:  Past Medical History:  Diagnosis Date  . Cancer [facility])    bladder cancer  . GERD (gastroesophageal reflux disease)   . Hypertension     SURGICAL HISTORY: Past Surgical History:  Procedure Laterality Date  . BLADDER SURGERY    . CYSTOSCOPY N/A 04/06/2017   Procedure: CYSTOSCOPY;  Surgeon: Raynelle Bring, MD;  Location: [facility];  Service: Urology;  Laterality: N/A;  .  CYSTOSCOPY W/ RETROGRADES N/A 01/28/2018   Procedure: CYSTOSCOPY WITH RETROGRADE PYELOGRAM;  Surgeon: Raynelle Bring, MD;  Location: [facility];  Service: Urology;  Laterality: N/A;  . CYSTOSCOPY W/ URETERAL STENT PLACEMENT N/A 10/08/2017   Procedure: CYSTOSCOPY WITH BILATERAL  RETROGRADE PYELOGRAM/ LEFT URETERAL STENT PLACEMENT;  Surgeon: Raynelle Bring, MD;  Location: [facility];  Service: Urology;  Laterality: N/A;  . TRANSURETHRAL RESECTION OF BLADDER TUMOR N/A 04/06/2017   Procedure: TRANSURETHRAL RESECTION OF BLADDER TUMOR (TURBT);  Surgeon: Raynelle Bring, MD;  Location: [facility];  Service: Urology;  Laterality: N/A;  . TRANSURETHRAL RESECTION OF BLADDER TUMOR N/A 10/08/2017   Procedure: TRANSURETHRAL RESECTION OF BLADDER TUMOR (TURBT);  Surgeon: Raynelle Bring, MD;  Location: [facility];  Service: Urology;  Laterality: N/A;  . TRANSURETHRAL RESECTION OF BLADDER TUMOR N/A 01/28/2018   Procedure: TRANSURETHRAL RESECTION OF BLADDER TUMOR (TURBT);  Surgeon: Raynelle Bring, MD;  Location: [facility];  Service: Urology;  Laterality: N/A;  GENERAL ANESTHESIA WITH PARALYSIS  . TRANSURETHRAL RESECTION OF BLADDER TUMOR WITH MITOMYCIN-C N/A 03/02/2017   Procedure: TRANSURETHRAL RESECTION OF BLADDER TUMOR/ EXAM UNDER ANESTHESIA/ WITH MITOMYCIN-C POST OPERATIVE;  Surgeon: Raynelle Bring, MD;  Location: [facility];  Service: Urology;  Laterality: N/A;    SOCIAL HISTORY: Social History   Socioeconomic History  . Marital status: Married    Spouse name: Not on file  . Number of children: 4  . Years of education: 10  . Highest education level: Not on file  Occupational History  . Occupation: Retired  Scientific laboratory technician  . Financial resource strain: Not on file  . Food insecurity:    Worry: Not on file    Inability: Not on file  . Transportation needs:    Medical: Not on file    Non-medical: Not on file  Tobacco Use  . Smoking status: Former Smoker    Packs/day: 0.15    Years: 10.00    Pack years: 1.50    Types: Cigarettes      Last attempt to quit: 1980    Years since quitting: 39.4  . Smokeless tobacco: Never Used  Substance and Sexual Activity  . Alcohol use: Not Currently  . Drug use: No    Comment: "years ago"  . Sexual activity: Not on file  Lifestyle  . Physical activity:    Days per week: Not on file    Minutes per session: Not on file  . Stress: Not on file  Relationships  . Social connections:    Talks on phone: Not on file    Gets together: Not on file    Attends religious service: Not on file    Active member of club or organization: Not on file    Attends meetings of clubs or organizations: Not on file    Relationship status: Not on file  . Intimate partner violence:    Fear of current or ex partner: Not on file    Emotionally abused: Not on file    Physically abused: Not on file    Forced sexual activity: Not on file  Other  Topics Concern  . Not on file  Social History Narrative   Fun/Hobby: Go to chur[employer] and travel.     FAMILY HISTORY: Family History  Problem Relation Age of Onset  . Hypertension Mother   . Stroke Mother   . Diabetes Mother   . Cataracts Mother   . Kidney disease Mother   . Hypertension Father   . Stroke Father   . Aneurysm Maternal Grandmother     ALLERGIES:  has No Known Allergies.  MEDICATIONS:  Current Outpatient Medications  Medication Sig Dispense Refill  . Cyanocobalamin (VITAMIN B 12 PO) Take 1 tablet by mouth daily.    Marland Kitchen diltiazem (CARDIZEM CD) 360 MG 24 hr capsule Take 1 capsule (360 mg total) by mouth daily. Must keep scheduled appt w/new provider for future refills 90 capsule 0  . Ferrous Gluconate (IRON 27 PO) Take 27 mg by mouth daily.    . Glucos-Chondroit-Hyaluron-MSM (GLUCOSAMINE CHONDROITIN JOINT PO) Take 1,500 mg by mouth daily.    . ondansetron (ZOFRAN-ODT) 4 MG disintegrating tablet Take 1 tablet (4 mg total) by mouth every 8 (eight) hours as needed for nausea or vomiting. 30 tablet 0  . ranitidine (ZANTAC) 300 MG tablet Take 1  tablet (300 mg total) by mouth every morning. Must keep appt for future refills 90 tablet 0  . sildenafil (VIAGRA) 50 MG tablet Take 1 tablet (50 mg total) by mouth daily as needed for erectile dysfunction. 20 tablet 3  . guaiFENesin-dextromethorphan (ROBITUSSIN DM) 100-10 MG/5ML syrup Take 5 mLs by mouth 3 (three) times daily as needed for cough. (Patient not taking: Reported on 05/06/2018) 118 mL 0   No current facility-administered medications for this visit.     REVIEW OF SYSTEMS:  .10 Point review of Systems was done is negative except as noted above.  PHYSICAL EXAMINATION: ECOG PERFORMANCE STATUS: 1 - Symptomatic but completely ambulatory  Vitals:   05/12/18 1245  BP: (!) 158/76  Pulse: 78  Resp: 18  Temp: 98.8 F (37.1 C)  SpO2: 98%   Filed Weights   05/12/18 1245  Weight: 195 lb 9.6 oz (88.7 kg)   .Body mass index is 26.53 kg/m. Marland Kitchen GENERAL:alert, in no acute distress and comfortable SKIN: no acute rashes, no significant lesions EYES: conjunctiva are pink and non-injected, sclera anicteric OROPHARYNX: MMM, no exudates, no oropharyngeal erythema or ulceration NECK: supple, no JVD LYMPH:  no palpable lymphadenopathy in the cervical, axillary or inguinal regions LUNGS: clear to auscultation b/l with normal respiratory effort HEART: regular rate & rhythm ABDOMEN:  normoactive bowel sounds , non tender, not distended. Extremity: no pedal edema PSYCH: alert & oriented x 3 with fluent speech NEURO: no focal motor/sensory deficits   LABORATORY DATA:  I have reviewed the data as listed  . CBC Latest Ref Rng & Units 05/06/2018 04/26/2018 04/26/2018  WBC 3.8 - 10.6 K/uL 6.2 - 7.3  Hemoglobin 13.0 - 18.0 g/dL 11.2(L) 12.2(L) 10.7(L)  Hematocrit 40.0 - 52.0 % 34.2(L) 36.0(L) 36.4(L)  Platelets 150 - 440 K/uL 351 - 307    . CMP Latest Ref Rng & Units 04/26/2018 04/26/2018 01/28/2018  Glucose 65 - 99 mg/dL 97 96 106(H)  BUN 6 - 20 mg/dL 18 23(H) 13  Creatinine 0.61 - 1.24  mg/dL 1.71(H) 1.60(H) 1.57(H)  Sodium 135 - 145 mmol/L 141 137 141  Potassium 3.5 - 5.1 mmol/L 4.0 6.8(HH) 4.1  Chloride 101 - 111 mmol/L 106 106 107  CO2 22 - 32 mmol/L 26 - 22  Calcium  8.9 - 10.3 mg/dL 8.8(L) - 8.9  Total Protein 6.0 - 8.3 g/dL - - -  Total Bilirubin 0.2 - 1.2 mg/dL - - -  Alkaline Phos 39 - 117 U/L - - -  AST 0 - 37 U/L - - -  ALT 0 - 53 U/L - - -    PATHOLOGY  DIAGNOSIS:  05/06/18 A. PLEURAL MASS, RIGHT; CT-GUIDED BIOPSY:  - METASTATIC HIGH-GRADE UROTHELIAL CARCINOMA. Comment:  The patient has a history of invasive high-grade urothelial carcinoma  with glandular (10%) and small cell differentiation (no more than 5%)  diagnosed in a TURBT specimen of the left bladder neck from March 2018  at an outside hospital.  Given the patient's history a limited panel of immunohistochemical  stains was performed with the following pattern of immunoreactivity:  GATA-3: Positive  CD56: Negative  TTF-1: Negative  Napsin: Negative  The immunohistochemical and morphologic findings are consistent with  metastatic high-grade urothelial carcinoma. The slides from the outside  prior TURBT ([id number]) were requested and reviewed in conjunction with  this case. While a glandular and small cell component was identified in  the 2018 specimen those morphologic patterns are not identified in the  current sample.  These findings were communicated to Dr. Irene Limbo via [facility] in [place] on  05/10/2018.  IHC slides were prepared by [facility] for Molecular Biology and  Pathology, [place], [place]. All controls stained appropriately. This test was developed and its performance characteristics determined  by [facility]. It has not been cleared or approved by the [place] Food and Drug  Administration. The FDA does not require this test to go through  premarket FDA review. This test is used for clinical purposes. It should  not be regarded as investigational or for research. This laboratory is  certified under  the Clinical Laboratory Improvement Amendments (CLIA) as  qualified to perform high complexity clinical laboratory testing.    01/28/18 Bx:    03/02/17 Bx:     RADIOGRAPHIC STUDIES: I have personally reviewed the radiological images as listed and agreed with the findings in the report. Dg Chest 2 View  Result Date: 04/26/2018 CLINICAL DATA:  Cough, shortness of breath EXAM: CHEST - 2 VIEW COMPARISON:  02/27/2017 FINDINGS: Multiple bilateral pulmonary masses and pulmonary nodules with the largest in the right middle lobe measuring 5 cm. Small right pleural effusion. No pneumothorax. Stable cardiomediastinal silhouette. IMPRESSION: Multiple bilateral pulmonary masses and pulmonary nodules most concerning for metastatic disease. Electronically Signed   By: Kathreen Devoid   On: 04/26/2018 14:09   Ct Chest W Contrast  Result Date: 04/26/2018 CLINICAL DATA:  Bladder cancer with productive cough. EXAM: CT CHEST, ABDOMEN, AND PELVIS WITH CONTRAST TECHNIQUE: Multidetector CT imaging of the chest, abdomen and pelvis was performed following the standard protocol during bolus administration of intravenous contrast. CONTRAST:  88m ISOVUE-300 IOPAMIDOL (ISOVUE-300) INJECTION 61% COMPARISON:  Chest x-ray from earlier today. FINDINGS: CT CHEST FINDINGS Cardiovascular: The heart size is normal. No pericardial effusion. Atherosclerotic calcification is noted in the wall of the thoracic aorta. Mediastinum/Nodes: Upper normal mediastinal lymph nodes evident. No axillary lymphadenopathy. 13 mm short axis right hilar lymph node is identified. There is a 17 mm lymph node in the inferior right hilum (image 32/2). 2.4 cm short axis lower paraesophageal lymph node visible on image 47/series 2. Lungs/Pleura: The central tracheobronchial airways are patent. Multiple bilateral pulmonary nodules and masses are evident, more prominent in the right hemithorax than the left. Dominant left lung nodule is identified in  the lingula  and measures 2.3 cm (image 80/series 6). Multiple pulmonary masses are identified in the right lung including 3.2 cm right upper lobe lesion on image 50 and a 4.4 cm right middle lobe mass on image 85. Multiple right pleural nodules are identified in the posterior lower chest in there is a small right pleural effusion. Musculoskeletal: Bone windows reveal no worrisome lytic or sclerotic osseous lesions. CT ABDOMEN PELVIS FINDINGS Hepatobiliary: Scattered homogeneous, hypoattenuating lesions are identified in the liver measuring up to 15 mm. Larger lesions are most suggestive of cysts. The smaller lesions cannot be definitively characterized. Multiple calcified gallstones measure up to about 13 mm diameter. No intrahepatic or extrahepatic biliary dilation. Pancreas: No focal mass lesion. No dilatation of the main duct. No intraparenchymal cyst. No peripancreatic edema. Spleen: No splenomegaly. No focal mass lesion. Adrenals/Urinary Tract: No adrenal nodule or mass. Kidneys unremarkable. No evidence for hydroureter. Mild circumferential wall thickening noted in the bladder. Subtle 10 mm soft tissue nodule is identified along the posterior left bladder wall, inferior to the left UVJ (see image 116/series 2 and coronal image 67/series 4. Stomach/Bowel: Tiny hiatal hernia. Stomach otherwise unremarkable. Duodenum is normally positioned as is the ligament of Treitz. No small bowel wall thickening. No small bowel dilatation. The terminal ileum is normal. The appendix is normal. Scattered diverticuli are seen in the right and left colon without diverticulitis. Vascular/Lymphatic: There is abdominal aortic atherosclerosis without aneurysm. There is no gastrohepatic or hepatoduodenal ligament lymphadenopathy. No intraperitoneal or retroperitoneal lymphadenopathy. No pelvic sidewall lymphadenopathy. Reproductive: The prostate gland and seminal vesicles have normal imaging features. Other: No intraperitoneal free fluid.  Musculoskeletal: Bone windows reveal no worrisome lytic or sclerotic osseous lesions. IMPRESSION: 1. Bilateral pulmonary nodules and masses, right greater than left are associated with right-sided pleural disease and small right pleural effusion. Metastatic disease in this patient with history of bladder cancer would be a distinct consideration although thoracic primary not excluded. 2. Lower paraesophageal lymphadenopathy 3. Mild circumferential bladder wall thickening with a 10 mm apparent bladder wall nodule posteriorly on the left. 4. No evidence for metastatic disease in the abdomen or pelvis. 5. Cholelithiasis. 6.  Aortic Atherosclerois (ICD10-170.0) Electronically Signed   By: Misty Stanley M.D.   On: 04/26/2018 15:29   Ct Abdomen Pelvis W Contrast  Result Date: 04/26/2018 CLINICAL DATA:  Bladder cancer with productive cough. EXAM: CT CHEST, ABDOMEN, AND PELVIS WITH CONTRAST TECHNIQUE: Multidetector CT imaging of the chest, abdomen and pelvis was performed following the standard protocol during bolus administration of intravenous contrast. CONTRAST:  80m ISOVUE-300 IOPAMIDOL (ISOVUE-300) INJECTION 61% COMPARISON:  Chest x-ray from earlier today. FINDINGS: CT CHEST FINDINGS Cardiovascular: The heart size is normal. No pericardial effusion. Atherosclerotic calcification is noted in the wall of the thoracic aorta. Mediastinum/Nodes: Upper normal mediastinal lymph nodes evident. No axillary lymphadenopathy. 13 mm short axis right hilar lymph node is identified. There is a 17 mm lymph node in the inferior right hilum (image 32/2). 2.4 cm short axis lower paraesophageal lymph node visible on image 47/series 2. Lungs/Pleura: The central tracheobronchial airways are patent. Multiple bilateral pulmonary nodules and masses are evident, more prominent in the right hemithorax than the left. Dominant left lung nodule is identified in the lingula and measures 2.3 cm (image 80/series 6). Multiple pulmonary masses are  identified in the right lung including 3.2 cm right upper lobe lesion on image 50 and a 4.4 cm right middle lobe mass on image 85. Multiple right pleural nodules are identified  in the posterior lower chest in there is a small right pleural effusion. Musculoskeletal: Bone windows reveal no worrisome lytic or sclerotic osseous lesions. CT ABDOMEN PELVIS FINDINGS Hepatobiliary: Scattered homogeneous, hypoattenuating lesions are identified in the liver measuring up to 15 mm. Larger lesions are most suggestive of cysts. The smaller lesions cannot be definitively characterized. Multiple calcified gallstones measure up to about 13 mm diameter. No intrahepatic or extrahepatic biliary dilation. Pancreas: No focal mass lesion. No dilatation of the main duct. No intraparenchymal cyst. No peripancreatic edema. Spleen: No splenomegaly. No focal mass lesion. Adrenals/Urinary Tract: No adrenal nodule or mass. Kidneys unremarkable. No evidence for hydroureter. Mild circumferential wall thickening noted in the bladder. Subtle 10 mm soft tissue nodule is identified along the posterior left bladder wall, inferior to the left UVJ (see image 116/series 2 and coronal image 67/series 4. Stomach/Bowel: Tiny hiatal hernia. Stomach otherwise unremarkable. Duodenum is normally positioned as is the ligament of Treitz. No small bowel wall thickening. No small bowel dilatation. The terminal ileum is normal. The appendix is normal. Scattered diverticuli are seen in the right and left colon without diverticulitis. Vascular/Lymphatic: There is abdominal aortic atherosclerosis without aneurysm. There is no gastrohepatic or hepatoduodenal ligament lymphadenopathy. No intraperitoneal or retroperitoneal lymphadenopathy. No pelvic sidewall lymphadenopathy. Reproductive: The prostate gland and seminal vesicles have normal imaging features. Other: No intraperitoneal free fluid. Musculoskeletal: Bone windows reveal no worrisome lytic or sclerotic osseous  lesions. IMPRESSION: 1. Bilateral pulmonary nodules and masses, right greater than left are associated with right-sided pleural disease and small right pleural effusion. Metastatic disease in this patient with history of bladder cancer would be a distinct consideration although thoracic primary not excluded. 2. Lower paraesophageal lymphadenopathy 3. Mild circumferential bladder wall thickening with a 10 mm apparent bladder wall nodule posteriorly on the left. 4. No evidence for metastatic disease in the abdomen or pelvis. 5. Cholelithiasis. 6.  Aortic Atherosclerois (ICD10-170.0) Electronically Signed   By: Misty Stanley M.D.   On: 04/26/2018 15:29   Nm Pet Image Initial (pi) Skull Base To Thigh  Result Date: 05/10/2018 CLINICAL DATA:  Initial treatment strategy for metastatic disease of unknown primary. History of bladder cancer. EXAM: NUCLEAR MEDICINE PET SKULL BASE TO THIGH TECHNIQUE: 9.6 mCi F-18 FDG was injected intravenously. Full-ring PET imaging was performed from the skull base to thigh after the radiotracer. CT data was obtained and used for attenuation correction and anatomic localization. Fasting blood glucose: 96 mg/dl COMPARISON:  04/26/2018 chest abdomen and pelvic CTs. FINDINGS: Mediastinal blood pool activity: SUV max 2.7 NECK: No areas of abnormal hypermetabolism. Incidental CT findings: No cervical adenopathy. CHEST: Extensive right-sided pleural metastasis with small right-sided pleural effusion. Index inferior right sided pleural implant measures on the order of 3.4 cm and a S.U.V. max of 12.9 on image 89/4. Thoracic nodal metastasis, with an index subcarinal node measuring 7 mm and a S.U.V. max of 3.9 on image 76/4. Bilateral pulmonary parenchymal metastasis. Right apical lung mass measures 2.9 cm and a S.U.V. max of 12.9 on image 58/4. A left upper lobe pulmonary nodule measures 1.3 cm and a S.U.V. max of 12.5 on image 64/4. Incidental CT findings: The right-sided pleural effusion is  slightly increased. Other chest findings deferred to recent diagnostic CT. ABDOMEN/PELVIS: No areas of abnormal hypermetabolism. Incidental CT findings: Multiple gallstones. Normal adrenal glands. Abdominopelvic findings deferred to recent diagnostic CT. SKELETON: No abnormal marrow activity. Incidental CT findings: No focal osseous lesion. IMPRESSION: 1. Widespread metastatic disease within the chest, including bilateral  pulmonary parenchymal, thoracic nodal, and right-sided pleural disease. 2. No evidence of extrathoracic metastatic disease. Electronically Signed   By: Abigail Miyamoto M.D.   On: 05/10/2018 14:41   Ct Biopsy  Result Date: 05/06/2018 INDICATION: History of bladder cancer, now with multiple pulmonary and pleural nodules and masses worrisome for metastatic disease. Please perform CT-guided biopsy of dominant pleural mass for tissue diagnostic purposes. EXAM: CT GUIDED RIGHT PLEURAL MASS BIOPSY COMPARISON:  CT of the chest, abdomen and pelvis - 04/26/2018 MEDICATIONS: None. ANESTHESIA/SEDATION: Fentanyl 50 mcg IV; Versed 1 mg IV Sedation time: 14 minutes; The patient was continuously monitored during the procedure by the interventional radiology nurse under my direct supervision. CONTRAST:  None. COMPLICATIONS: None immediate. PROCEDURE: Informed consent was obtained from the patient following an explanation of the procedure, risks, benefits and alternatives. A time out was performed prior to the initiation of the procedure. The patient was positioned prone on the CT table and a limited CT was performed for procedural planning demonstrating grossly unchanged size and appearance of the approximately 4.4 x 2.1 cm mass within the posterior basilar aspect of the right pleura (image 45, series 2). The procedure was planned. The operative site was prepped and draped in the usual sterile fashion. Appropriate trajectory was confirmed with a 22 gauge spinal needle after the adjacent tissues were anesthetized  with 1% Lidocaine with epinephrine. Under intermittent CT guidance, a 17 gauge coaxial needle was advanced into the peripheral aspect of the mass. Appropriate positioning was confirmed and 5 core needle biopsy samples were obtained with an 18 gauge core needle biopsy device. The co-axial needle was removed and hemostasis was achieved with manual compression. A limited postprocedural CT was negative for pneumothorax, hemorrhage or additional complication. A dressing was placed. The patient tolerated the procedure well without immediate postprocedural complication. IMPRESSION: Technically successful CT guided core needle biopsy of right posterior basilar pleural mass. Electronically Signed   By: Sandi Mariscal M.D.   On: 05/06/2018 10:37    ASSESSMENT & PLAN:  72 y.o. male with  1. 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   -04/26/18 CT C/A/P which revealed Bilateral pulmonary nodules and masses, right greater than left are associated with right-sided pleural disease and small right pleural effusion.  -CXR 02/27/17 did not reveal any lung masses -Pathology from March 2018 showed a small cell component and invasion into the urinary bladder muscle -PET from 05/10/18 which shows wide spread metastatic disease in the chest, including bilateral pulmonary parenchymal, thoracic nodal, and right-sided pleural disease. No evidence of extrathoracic metastatic disease. 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 He has recurrent now high grade metastatic disease to the lung.   2. Intractable cough from pulmonary metastases -On tessalon perles with some relief  PLAN -We discussed pt's PET from 05/10/18 which shows wide spread metastatic  disease in the chest, including bilateral pulmonary parenchymal, thoracic nodal, and right-sided pleural disease. No evidence of extrathoracic metastatic disease. 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 Scan also shows fluid collection around his lungs which is likely to contribute.  -I recommend a MRI brain to rule out brain metastasis to complete  staging -ordered. He is agreeable.  -I discussed this new diagnosis of metastatic stage IV Urothelial bladder cancer which is no longer curable, but still treatable.  -I discussed standard of care for first-line treatment would include Cisplatin. Given his CKD I recommend renally adjusted Carboplatin + Gemcitabine every 3 weeks for upto 6 cycles if tolerable. He is agreeable.  -I set up further testing of his biopsy sample for MMR panel to see if he is eligible for immunotherapy.  -Goal of treatment would be palliative to control his disease. if good response to treatment this should help improve his cough and cancer symptoms as well.  -Will set IR PAC placement for chemo treatment and chemo education class to start treatment in the next 1-2 weeks.  -Given the poor prognosis I discussed he should have advanced directives set in place.  -I offered consultation with our dietician to help with nutrition management. He is interested, I will set up.  -I also offered social work resources if he needs any financial support.    -MRI brain WO contrast for staging -Dieitician referral -Chemo-counseling for Carboplatin/Gemcitabine for newly diagnosed metastatic urothelial carcinoma. -IR for port-a-cath placement -schedule to start chemotherapy Carboplatin/Gemcitabine with neulasta in 1week with labs -RTC with Dr Irene Limbo with C1D1 of chemotherapy with labs   All of the patients questions were answered with apparent satisfaction. The  patient knows to call the clinic with any problems, questions or concerns.  . The total time spent in the appointment was 40 minutes and more than 50% was on counseling and direct patient cares.   Sullivan Lone MD MS AAHIVMS [facility] Hematology/Oncology Physician [facility]  (Office):       [phone] (Work cell):  [phone] (Fax):           [phone]  05/12/2018 1:10 PM  I, Joslyn Devon, am acting as scribe for Brunetta Genera, MD.   .I have reviewed the above documentation for accuracy and completeness, and I agree with the above.   Brunetta Genera MD MS

## 2018-05-12 ENCOUNTER — Telehealth: Payer: Self-pay

## 2018-05-12 ENCOUNTER — Inpatient Hospital Stay: Payer: Medicare Other | Attending: Hematology | Admitting: Hematology

## 2018-05-12 ENCOUNTER — Encounter: Payer: Self-pay | Admitting: Hematology

## 2018-05-12 ENCOUNTER — Inpatient Hospital Stay: Payer: Medicare Other

## 2018-05-12 ENCOUNTER — Telehealth: Payer: Self-pay | Admitting: Hematology

## 2018-05-12 VITALS — BP 158/76 | HR 78 | Temp 98.8°F | Resp 18 | Ht 72.0 in | Wt 195.6 lb

## 2018-05-12 DIAGNOSIS — D649 Anemia, unspecified: Secondary | ICD-10-CM | POA: Insufficient documentation

## 2018-05-12 DIAGNOSIS — Z5189 Encounter for other specified aftercare: Secondary | ICD-10-CM | POA: Insufficient documentation

## 2018-05-12 DIAGNOSIS — Z5111 Encounter for antineoplastic chemotherapy: Secondary | ICD-10-CM | POA: Diagnosis not present

## 2018-05-12 DIAGNOSIS — N181 Chronic kidney disease, stage 1: Secondary | ICD-10-CM | POA: Insufficient documentation

## 2018-05-12 DIAGNOSIS — C679 Malignant neoplasm of bladder, unspecified: Secondary | ICD-10-CM | POA: Insufficient documentation

## 2018-05-12 DIAGNOSIS — C7800 Secondary malignant neoplasm of unspecified lung: Secondary | ICD-10-CM | POA: Insufficient documentation

## 2018-05-12 DIAGNOSIS — R630 Anorexia: Secondary | ICD-10-CM | POA: Insufficient documentation

## 2018-05-12 DIAGNOSIS — R05 Cough: Secondary | ICD-10-CM | POA: Diagnosis not present

## 2018-05-12 DIAGNOSIS — C7910 Secondary malignant neoplasm of unspecified urinary organs: Secondary | ICD-10-CM

## 2018-05-12 NOTE — Telephone Encounter (Signed)
Appointments scheduled AVS/Calendar printed per 6//5 los

## 2018-05-12 NOTE — Telephone Encounter (Signed)
Called [facility]. Request to add PDL1 testing on sample per Dr. Irene Limbo.

## 2018-05-13 ENCOUNTER — Other Ambulatory Visit: Payer: Self-pay | Admitting: *Deleted

## 2018-05-13 ENCOUNTER — Telehealth: Payer: Self-pay | Admitting: Hematology

## 2018-05-13 ENCOUNTER — Telehealth: Payer: Self-pay

## 2018-05-13 NOTE — Telephone Encounter (Signed)
Per Jeremy Patrick is working on [employer]

## 2018-05-13 NOTE — Telephone Encounter (Signed)
Levada Dy removed from [facility] on 6/6 (checked out). How ever Kenney Houseman is working on [employer]

## 2018-05-13 NOTE — Telephone Encounter (Signed)
Called pt re appts being added- spoke w/ wife re appts.

## 2018-05-14 ENCOUNTER — Other Ambulatory Visit (HOSPITAL_COMMUNITY)
Admission: RE | Admit: 2018-05-14 | Discharge: 2018-05-14 | Disposition: A | Discharge status: Home / Self Care | Payer: Medicare Other | Source: Ambulatory Visit | Attending: Hematology | Admitting: Hematology

## 2018-05-17 ENCOUNTER — Inpatient Hospital Stay: Payer: Medicare Other | Admitting: Nutrition

## 2018-05-17 ENCOUNTER — Encounter: Payer: Self-pay | Admitting: *Deleted

## 2018-05-17 ENCOUNTER — Inpatient Hospital Stay: Payer: Medicare Other

## 2018-05-17 NOTE — Progress Notes (Signed)
72 year old male diagnosed with urethral cancer with mets to lung.  He is a patient of Dr. Irene Limbo.  Past medical history includes chronic kidney disease, hypertension, and GERD.  Medications include vitamin B12, iron, and Zofran.  Labs on May 20 include creatinine 1.7 and potassium 4.0.  Height: 6 feet 0 inches. Weight: 195.6 pounds. Usual body weight: 215 pounds in March. BMI: 26.53.  Patient reports decreased appetite and decreased oral intake. He denies nausea and vomiting. He does have constipation and attributes this to decreased fluid intake. Dietary recall reveals patient can eat eggs and bacon, potato soup, clam chowder, and watermelon. He eats very small amounts and generally does not snack. He reports the oral nutrition supplements were very rich and he does not care for them. Nutrition focused physical exam was deferred.  Nutrition diagnosis:  Unintended weight loss related to metastatic cancer as evidenced by 20 pound weight loss over 3 months.  Intervention: Educated patient on the importance of increased calories and protein to promote weight maintenance. Encouraged regular meals with snacks in between. Recommended patient increase fluid intake. Educated patient on the importance of a bowel regimen to avoid constipation. Encourage patient to try oral nutrition supplements and provided strategies for making them more tasteful.  Provided samples. Gave patient fact sheets.  Questions were answered.  Take teach back method was used.  Contact information was given.  Monitoring, evaluation, goals: Patient will tolerate increased calories and protein to promote weight stabilization.  Next visit: To be scheduled with treatment as needed.  **Disclaimer: This note was dictated with voice recognition software. Similar sounding words can inadvertently be transcribed and this note may contain transcription errors which may not have been corrected upon publication of note.**

## 2018-05-17 NOTE — Progress Notes (Signed)
[place] Social Work  Clinical Social Work was referred by patient to review and complete healthcare advance directives.  Clinical Social Worker met with patient and patients wife in [place] office.  The patient designated Laquintia Skylier Kretschmer as their primary healthcare agent and Hadriel Northup as their secondary agent.  Patient also completed healthcare living will.    Clinical Social Worker notarized documents and made copies for patient/family. Clinical Social Worker will send documents to medical records to be scanned into patient's chart. Clinical Social Worker encouraged patient/family to contact with any additional questions or concerns.  Johnnye Lana, MSW, LCSW, OSW-C Clinical Social Worker [facility] [phone]

## 2018-05-18 ENCOUNTER — Other Ambulatory Visit: Payer: Self-pay | Admitting: Hematology

## 2018-05-18 DIAGNOSIS — Z7189 Other specified counseling: Secondary | ICD-10-CM | POA: Insufficient documentation

## 2018-05-18 DIAGNOSIS — C7800 Secondary malignant neoplasm of unspecified lung: Secondary | ICD-10-CM | POA: Insufficient documentation

## 2018-05-18 DIAGNOSIS — C679 Malignant neoplasm of bladder, unspecified: Secondary | ICD-10-CM

## 2018-05-18 MED ORDER — LIDOCAINE-PRILOCAINE 2.5-2.5 % EX CREA: TOPICAL_CREAM | CUTANEOUS | 3 refills | Status: DC

## 2018-05-18 MED ORDER — LORAZEPAM 0.5 MG PO TABS: 0.5000 mg | ORAL_TABLET | Freq: Four times a day (QID) | ORAL | 0 refills | Status: DC | PRN

## 2018-05-18 MED ORDER — DEXAMETHASONE 4 MG PO TABS
8.0000 mg | ORAL_TABLET | Freq: Every day | ORAL | 1 refills | Status: DC
Start: 2018-05-18 — End: 2018-07-21

## 2018-05-18 MED ORDER — ONDANSETRON HCL 8 MG PO TABS: 8.0000 mg | ORAL_TABLET | Freq: Two times a day (BID) | ORAL | 1 refills | Status: DC | PRN

## 2018-05-18 MED ORDER — PROCHLORPERAZINE MALEATE 10 MG PO TABS: 10.0000 mg | ORAL_TABLET | Freq: Four times a day (QID) | ORAL | 1 refills | Status: DC | PRN

## 2018-05-18 NOTE — Progress Notes (Signed)
HEMATOLOGY/ONCOLOGY CONSULTATION NOTE  Date of Service: 05/19/2018  Patient Care Team: Martinique, Betty G, MD as PCP - General (Family Medicine)  Raynelle Bring, MD as Urologist  CHIEF COMPLAINTS/PURPOSE OF CONSULTATION:  F/u for newly diagnosed Metastatic Urothelial bladder carcinoma   HISTORY OF PRESENTING ILLNESS:   Jeremy Patrick is a wonderful 72 y.o. male who has been referred to [place] by Dr Betty Martinique for evaluation and management of metastatic malignancy of unknown primary. He is accompanied today by his wife. The pt reports that he is doing well overall.   The pt reports that he first noticed hematuria two years ago. He has had multiple bladder tumors removed since then, with the last resection in February 2019. He denies knowledge of metastasis and has been followed by urology and hasn't been seen by medical oncology previously.  He notes that his hematuria has resolved. His last treatment with intravesical Mitomycin C was 2-3 months ago, soon after his last tumor resection.   He presented to the ED on 04/26/18 because he was having intractable coughing. He notes that he would cough so much that he began to throw up. He denies coughing up any blood and has brought up some clear phlegm. He first noticed his cough one month ago, and it worsened to the point that he presented to the ED a couple days ago.  He does endorse feeling sleepier in the last month, as he wasn't eating as well- which he describes as a change in the last month. He notes that he has lost 20-30 pounds in the last 2-3 months after beginning Mytomycin C. He felt chills and flu-like symptoms for 2 weeks after beginning Mitomycin C.   Of note prior to the patient's visit today, pt has had CT C/A/P completed on 04/26/18 in the ED with results revealing 1. Bilateral pulmonary nodules and masses, right greater than left are associated with right-sided pleural disease and small right pleural effusion. Metastatic disease in this  patient with history of bladder cancer would be a distinct consideration although thoracic primary not excluded. 2. Lower paraesophageal lymphadenopathy 3. Mild circumferential bladder wall thickening with a 10 mm apparent bladder wall nodule posteriorly on the left. 4. No evidence for metastatic disease in the abdomen or pelvis. 5. Cholelithiasis. 6.  Aortic Atherosclerois.   Most recent lab results (04/26/18) of CBC  is as follows: all values are WNL except for Hgb at 10.7, HCT at 36.4, MCH at 24.3, MCHC at 29.4, Monocytes Abs at 1.3k.  On review of systems, pt reports decreased appetite, weight loss, increased fatigue, productive cough, mild back pains, and denies back aches, head aches, fevers, chills, night sweats, changes in vision, difficulty swallowing, and any other symptoms.   On PMHx the pt reports chronic kidney disease, bladder cancer, hypertension, GERD. On Social Hx the pt reports frequent exposure to clorox and he works in Development worker, international aid.    INTERVAL HISTORY   Jeremy Patrick is here for follow up regarding his metastatic urothelial carcinoma. The patient's last visit with [place] was on 05/12/18. He is accompanied today by his son, daughter and wife. The pt reports that he is doing well overall.   The pt reports that his cough has continued and he was not able to pay for his tessalon pearls but will now be able to. He also notes that his appetite is still not very strong. He notes that he has seen a nutritional therapist already and was not able  to keep down Ensure after trying to supplement his meals with it. He notes that he will try to begin eating more.   He notes that his sleeping has been disturbed at night with needing to urinate.   Of note since the patient's last visit, pt has had Brain MRI completed on 05/19/18 with results revealing No evidence of intracranial metastatic disease on this unenhanced examination. Mild-to-moderate chronic small vessel ischemic disease.    Lab results today (05/19/18) of CBC, CMP, and Reticulocytes is as follows: all values are WNL except for RBC at 4.00, HGB at 10.1, HCT at 32.2, MCH at 25.3, MCHC at 31.4, RDW at 15.2, Creatinine at 1.63, Albumin at 3.2, GFR at 48. Magnesium 05/19/18 is WNL at 2.3   On review of systems, pt reports cough, decreased appetite, and denies leg swelling and any other symptoms.    MEDICAL HISTORY:  Past Medical History:  Diagnosis Date  . Cancer [facility])    bladder cancer  . GERD (gastroesophageal reflux disease)   . Hypertension     SURGICAL HISTORY: Past Surgical History:  Procedure Laterality Date  . BLADDER SURGERY    . CYSTOSCOPY N/A 04/06/2017   Procedure: CYSTOSCOPY;  Surgeon: Raynelle Bring, MD;  Location: [facility];  Service: Urology;  Laterality: N/A;  . CYSTOSCOPY W/ RETROGRADES N/A 01/28/2018   Procedure: CYSTOSCOPY WITH RETROGRADE PYELOGRAM;  Surgeon: Raynelle Bring, MD;  Location: [facility];  Service: Urology;  Laterality: N/A;  . CYSTOSCOPY W/ URETERAL STENT PLACEMENT N/A 10/08/2017   Procedure: CYSTOSCOPY WITH BILATERAL  RETROGRADE PYELOGRAM/ LEFT URETERAL STENT PLACEMENT;  Surgeon: Raynelle Bring, MD;  Location: [facility];  Service: Urology;  Laterality: N/A;  . TRANSURETHRAL RESECTION OF BLADDER TUMOR N/A 04/06/2017   Procedure: TRANSURETHRAL RESECTION OF BLADDER TUMOR (TURBT);  Surgeon: Raynelle Bring, MD;  Location: [facility];  Service: Urology;  Laterality: N/A;  . TRANSURETHRAL RESECTION OF BLADDER TUMOR N/A 10/08/2017   Procedure: TRANSURETHRAL RESECTION OF BLADDER TUMOR (TURBT);  Surgeon: Raynelle Bring, MD;  Location: [facility];  Service: Urology;  Laterality: N/A;  . TRANSURETHRAL RESECTION OF BLADDER TUMOR N/A 01/28/2018   Procedure: TRANSURETHRAL RESECTION OF BLADDER TUMOR (TURBT);  Surgeon: Raynelle Bring, MD;  Location: [facility];  Service: Urology;  Laterality: N/A;  GENERAL ANESTHESIA WITH PARALYSIS  . TRANSURETHRAL RESECTION OF BLADDER TUMOR WITH MITOMYCIN-C N/A 03/02/2017   Procedure:  TRANSURETHRAL RESECTION OF BLADDER TUMOR/ EXAM UNDER ANESTHESIA/ WITH MITOMYCIN-C POST OPERATIVE;  Surgeon: Raynelle Bring, MD;  Location: [facility];  Service: Urology;  Laterality: N/A;    SOCIAL HISTORY: Social History   Socioeconomic History  . Marital status: Married    Spouse name: Not on file  . Number of children: 4  . Years of education: 10  . Highest education level: Not on file  Occupational History  . Occupation: Retired  Scientific laboratory technician  . Financial resource strain: Not on file  . Food insecurity:    Worry: Not on file    Inability: Not on file  . Transportation needs:    Medical: Not on file    Non-medical: Not on file  Tobacco Use  . Smoking status: Former Smoker    Packs/day: 0.15    Years: 10.00    Pack years: 1.50    Types: Cigarettes    Last attempt to quit: 1980    Years since quitting: 39.4  . Smokeless tobacco: Never Used  Substance and Sexual Activity  . Alcohol use: Not Currently  . Drug use: No  Comment: "years ago"  . Sexual activity: Not on file  Lifestyle  . Physical activity:    Days per week: Not on file    Minutes per session: Not on file  . Stress: Not on file  Relationships  . Social connections:    Talks on phone: Not on file    Gets together: Not on file    Attends religious service: Not on file    Active member of club or organization: Not on file    Attends meetings of clubs or organizations: Not on file    Relationship status: Not on file  . Intimate partner violence:    Fear of current or ex partner: Not on file    Emotionally abused: Not on file    Physically abused: Not on file    Forced sexual activity: Not on file  Other Topics Concern  . Not on file  Social History Narrative   Fun/Hobby: Go to [employer] and travel.     FAMILY HISTORY: Family History  Problem Relation Age of Onset  . Hypertension Mother   . Stroke Mother   . Diabetes Mother   . Cataracts Mother   . Kidney disease Mother   . Hypertension Father   .  Stroke Father   . Aneurysm Maternal Grandmother     ALLERGIES:  has No Known Allergies.  MEDICATIONS:  Current Outpatient Medications  Medication Sig Dispense Refill  . Cyanocobalamin (VITAMIN B 12 PO) Take 1 tablet by mouth daily.    Marland Kitchen dexamethasone (DECADRON) 4 MG tablet Take 2 tablets (8 mg total) by mouth daily. Start the day after carboplatin chemotherapy for 2 days. 30 tablet 1  . diltiazem (CARDIZEM CD) 360 MG 24 hr capsule Take 1 capsule (360 mg total) by mouth daily. Must keep scheduled appt w/new provider for future refills 90 capsule 0  . Ferrous Gluconate (IRON 27 PO) Take 27 mg by mouth daily.    . Glucos-Chondroit-Hyaluron-MSM (GLUCOSAMINE CHONDROITIN JOINT PO) Take 1,500 mg by mouth daily.    Marland Kitchen guaiFENesin-dextromethorphan (ROBITUSSIN DM) 100-10 MG/5ML syrup Take 5 mLs by mouth 3 (three) times daily as needed for cough. (Patient not taking: Reported on 05/06/2018) 118 mL 0  . lidocaine-prilocaine (EMLA) cream Apply to affected area once 30 g 3  . LORazepam (ATIVAN) 0.5 MG tablet Take 1 tablet (0.5 mg total) by mouth every 6 (six) hours as needed (Nausea or vomiting). 30 tablet 0  . ondansetron (ZOFRAN) 8 MG tablet Take 1 tablet (8 mg total) by mouth 2 (two) times daily as needed for refractory nausea / vomiting. Start on day 3 after carboplatin chemo. 30 tablet 1  . ondansetron (ZOFRAN-ODT) 4 MG disintegrating tablet Take 1 tablet (4 mg total) by mouth every 8 (eight) hours as needed for nausea or vomiting. 30 tablet 0  . prochlorperazine (COMPAZINE) 10 MG tablet Take 1 tablet (10 mg total) by mouth every 6 (six) hours as needed (Nausea or vomiting). 30 tablet 1  . ranitidine (ZANTAC) 300 MG tablet Take 1 tablet (300 mg total) by mouth every morning. Must keep appt for future refills 90 tablet 0  . sildenafil (VIAGRA) 50 MG tablet Take 1 tablet (50 mg total) by mouth daily as needed for erectile dysfunction. 20 tablet 3   No current facility-administered medications for this  visit.     REVIEW OF SYSTEMS:  A 10+ POINT REVIEW OF SYSTEMS WAS OBTAINED including neurology, dermatology, psychiatry, cardiac, respiratory, lymph, extremities, GI, GU, Musculoskeletal, constitutional, breasts, reproductive,  HEENT.  All pertinent positives are noted in the HPI.  All others are negative.   PHYSICAL EXAMINATION: ECOG PERFORMANCE STATUS: 1 - Symptomatic but completely ambulatory  Vitals:   05/19/18 1243 05/19/18 1309  BP: (!) 148/74 (!) 104/53  Pulse: 75 83  Resp: 18 18  Temp: 99.1 F (37.3 C) 99.4 F (37.4 C)  SpO2: 96% 99%   Filed Weights   05/19/18 1243 05/19/18 1309  Weight: 191 lb 6.4 oz (86.8 kg) 173 lb 14.4 oz (78.9 kg)   .Body mass index is 23.59 kg/m.  GENERAL:alert, in no acute distress and comfortable SKIN: no acute rashes, no significant lesions EYES: conjunctiva are pink and non-injected, sclera anicteric OROPHARYNX: MMM, no exudates, no oropharyngeal erythema or ulceration NECK: supple, no JVD LYMPH:  no palpable lymphadenopathy in the cervical, axillary or inguinal regions LUNGS: clear to auscultation b/l with normal respiratory effort HEART: regular rate & rhythm ABDOMEN:  normoactive bowel sounds , non tender, not distended. Extremity: no pedal edema PSYCH: alert & oriented x 3 with fluent speech NEURO: no focal motor/sensory deficits   LABORATORY DATA:  I have reviewed the data as listed  . CBC Latest Ref Rng & Units 05/21/2018 05/19/2018 05/06/2018  WBC 4.0 - 10.5 K/uL 13.4(H) 7.2 6.2  Hemoglobin 13.0 - 17.0 g/dL 10.8(L) 10.1(L) 11.2(L)  Hematocrit 39.0 - 52.0 % 34.1(L) 32.2(L) 34.2(L)  Platelets 150 - 400 K/uL 405(H) 363 351    . CMP Latest Ref Rng & Units 05/19/2018 04/26/2018 04/26/2018  Glucose 70 - 140 mg/dL 110 97 96  BUN 7 - 26 mg/dL 19 18 23(H)  Creatinine 0.70 - 1.30 mg/dL 1.63(H) 1.71(H) 1.60(H)  Sodium 136 - 145 mmol/L 142 141 137  Potassium 3.5 - 5.1 mmol/L 3.9 4.0 6.8(HH)  Chloride 98 - 109 mmol/L 103 106 106  CO2 22 -  29 mmol/L 28 26 -  Calcium 8.4 - 10.4 mg/dL 9.5 8.8(L) -  Total Protein 6.4 - 8.3 g/dL 7.7 - -  Total Bilirubin 0.2 - 1.2 mg/dL 0.3 - -  Alkaline Phos 40 - 150 U/L 75 - -  AST 5 - 34 U/L 17 - -  ALT 0 - 55 U/L 11 - -    PATHOLOGY  DIAGNOSIS:  05/06/18 A. PLEURAL MASS, RIGHT; CT-GUIDED BIOPSY:  - METASTATIC HIGH-GRADE UROTHELIAL CARCINOMA. Comment:  The patient has a history of invasive high-grade urothelial carcinoma  with glandular (10%) and small cell differentiation (no more than 5%)  diagnosed in a TURBT specimen of the left bladder neck from March 2018  at an outside hospital.  Given the patient's history a limited panel of immunohistochemical  stains was performed with the following pattern of immunoreactivity:  GATA-3: Positive  CD56: Negative  TTF-1: Negative  Napsin: Negative  The immunohistochemical and morphologic findings are consistent with  metastatic high-grade urothelial carcinoma. The slides from the outside  prior TURBT ([id number]) were requested and reviewed in conjunction with  this case. While a glandular and small cell component was identified in  the 2018 specimen those morphologic patterns are not identified in the  current sample.  These findings were communicated to Dr. Irene Limbo via [facility] in [place] on  05/10/2018.  IHC slides were prepared by [facility], [place], [place]. All controls stained appropriately. This test was developed and its performance characteristics determined  by [facility]. It has not been cleared or approved by the [place] Food and Drug  Administration. The FDA does not require this  test to go through  premarket FDA review. This test is used for clinical purposes. It should  not be regarded as investigational or for research. This laboratory is  certified under the Clinical Laboratory Improvement Amendments (CLIA) as  qualified to perform high complexity clinical laboratory testing.    01/28/18 Bx:    03/02/17  Bx:     RADIOGRAPHIC STUDIES: I have personally reviewed the radiological images as listed and agreed with the findings in the report. Dg Chest 2 View  Result Date: 04/26/2018 CLINICAL DATA:  Cough, shortness of breath EXAM: CHEST - 2 VIEW COMPARISON:  02/27/2017 FINDINGS: Multiple bilateral pulmonary masses and pulmonary nodules with the largest in the right middle lobe measuring 5 cm. Small right pleural effusion. No pneumothorax. Stable cardiomediastinal silhouette. IMPRESSION: Multiple bilateral pulmonary masses and pulmonary nodules most concerning for metastatic disease. Electronically Signed   By: Kathreen Devoid   On: 04/26/2018 14:09   Ct Chest W Contrast  Result Date: 04/26/2018 CLINICAL DATA:  Bladder cancer with productive cough. EXAM: CT CHEST, ABDOMEN, AND PELVIS WITH CONTRAST TECHNIQUE: Multidetector CT imaging of the chest, abdomen and pelvis was performed following the standard protocol during bolus administration of intravenous contrast. CONTRAST:  64m ISOVUE-300 IOPAMIDOL (ISOVUE-300) INJECTION 61% COMPARISON:  Chest x-ray from earlier today. FINDINGS: CT CHEST FINDINGS Cardiovascular: The heart size is normal. No pericardial effusion. Atherosclerotic calcification is noted in the wall of the thoracic aorta. Mediastinum/Nodes: Upper normal mediastinal lymph nodes evident. No axillary lymphadenopathy. 13 mm short axis right hilar lymph node is identified. There is a 17 mm lymph node in the inferior right hilum (image 32/2). 2.4 cm short axis lower paraesophageal lymph node visible on image 47/series 2. Lungs/Pleura: The central tracheobronchial airways are patent. Multiple bilateral pulmonary nodules and masses are evident, more prominent in the right hemithorax than the left. Dominant left lung nodule is identified in the lingula and measures 2.3 cm (image 80/series 6). Multiple pulmonary masses are identified in the right lung including 3.2 cm right upper lobe lesion on image 50 and a  4.4 cm right middle lobe mass on image 85. Multiple right pleural nodules are identified in the posterior lower chest in there is a small right pleural effusion. Musculoskeletal: Bone windows reveal no worrisome lytic or sclerotic osseous lesions. CT ABDOMEN PELVIS FINDINGS Hepatobiliary: Scattered homogeneous, hypoattenuating lesions are identified in the liver measuring up to 15 mm. Larger lesions are most suggestive of cysts. The smaller lesions cannot be definitively characterized. Multiple calcified gallstones measure up to about 13 mm diameter. No intrahepatic or extrahepatic biliary dilation. Pancreas: No focal mass lesion. No dilatation of the main duct. No intraparenchymal cyst. No peripancreatic edema. Spleen: No splenomegaly. No focal mass lesion. Adrenals/Urinary Tract: No adrenal nodule or mass. Kidneys unremarkable. No evidence for hydroureter. Mild circumferential wall thickening noted in the bladder. Subtle 10 mm soft tissue nodule is identified along the posterior left bladder wall, inferior to the left UVJ (see image 116/series 2 and coronal image 67/series 4. Stomach/Bowel: Tiny hiatal hernia. Stomach otherwise unremarkable. Duodenum is normally positioned as is the ligament of Treitz. No small bowel wall thickening. No small bowel dilatation. The terminal ileum is normal. The appendix is normal. Scattered diverticuli are seen in the right and left colon without diverticulitis. Vascular/Lymphatic: There is abdominal aortic atherosclerosis without aneurysm. There is no gastrohepatic or hepatoduodenal ligament lymphadenopathy. No intraperitoneal or retroperitoneal lymphadenopathy. No pelvic sidewall lymphadenopathy. Reproductive: The prostate gland and seminal vesicles have normal imaging features. Other:  No intraperitoneal free fluid. Musculoskeletal: Bone windows reveal no worrisome lytic or sclerotic osseous lesions. IMPRESSION: 1. Bilateral pulmonary nodules and masses, right greater than left  are associated with right-sided pleural disease and small right pleural effusion. Metastatic disease in this patient with history of bladder cancer would be a distinct consideration although thoracic primary not excluded. 2. Lower paraesophageal lymphadenopathy 3. Mild circumferential bladder wall thickening with a 10 mm apparent bladder wall nodule posteriorly on the left. 4. No evidence for metastatic disease in the abdomen or pelvis. 5. Cholelithiasis. 6.  Aortic Atherosclerois (ICD10-170.0) Electronically Signed   By: Misty Stanley M.D.   On: 04/26/2018 15:29   Mr Brain Wo Contrast  Result Date: 05/19/2018 CLINICAL DATA:  Staging of metastatic urothelial carcinoma. Noncontrast study requested due to renal dysfunction. EXAM: MRI HEAD WITHOUT CONTRAST TECHNIQUE: Multiplanar, multiecho pulse sequences of the brain and surrounding structures were obtained without intravenous contrast. COMPARISON:  None. FINDINGS: Brain: There is no evidence of acute infarct, intracranial hemorrhage, intracranial mass effect, or extra-axial fluid collection. Mild generalized cerebral atrophy is not greater than expected for age. Patchy subcortical and deep cerebral white matter T2 hyperintensities are nonspecific but compatible with mild-to-moderate chronic small vessel ischemic disease. Chronic lacunar infarcts are noted in the right paramedian pons and right putamen. No mass is identified, however sensitivity for detection of very small lesions is reduced by the lack of IV contrast. No vasogenic edema type signal is present. Vascular: Major intracranial vascular flow voids are preserved. Skull and upper cervical spine: Unremarkable bone marrow signal. Sinuses/Orbits: Unremarkable orbits. Paranasal sinuses and mastoid air cells are clear. Other: None. IMPRESSION: 1. No evidence of intracranial metastatic disease on this unenhanced examination. 2. Mild-to-moderate chronic small vessel ischemic disease. Electronically Signed   By:  Logan Bores M.D.   On: 05/19/2018 09:05   Ct Abdomen Pelvis W Contrast  Result Date: 04/26/2018 CLINICAL DATA:  Bladder cancer with productive cough. EXAM: CT CHEST, ABDOMEN, AND PELVIS WITH CONTRAST TECHNIQUE: Multidetector CT imaging of the chest, abdomen and pelvis was performed following the standard protocol during bolus administration of intravenous contrast. CONTRAST:  49m ISOVUE-300 IOPAMIDOL (ISOVUE-300) INJECTION 61% COMPARISON:  Chest x-ray from earlier today. FINDINGS: CT CHEST FINDINGS Cardiovascular: The heart size is normal. No pericardial effusion. Atherosclerotic calcification is noted in the wall of the thoracic aorta. Mediastinum/Nodes: Upper normal mediastinal lymph nodes evident. No axillary lymphadenopathy. 13 mm short axis right hilar lymph node is identified. There is a 17 mm lymph node in the inferior right hilum (image 32/2). 2.4 cm short axis lower paraesophageal lymph node visible on image 47/series 2. Lungs/Pleura: The central tracheobronchial airways are patent. Multiple bilateral pulmonary nodules and masses are evident, more prominent in the right hemithorax than the left. Dominant left lung nodule is identified in the lingula and measures 2.3 cm (image 80/series 6). Multiple pulmonary masses are identified in the right lung including 3.2 cm right upper lobe lesion on image 50 and a 4.4 cm right middle lobe mass on image 85. Multiple right pleural nodules are identified in the posterior lower chest in there is a small right pleural effusion. Musculoskeletal: Bone windows reveal no worrisome lytic or sclerotic osseous lesions. CT ABDOMEN PELVIS FINDINGS Hepatobiliary: Scattered homogeneous, hypoattenuating lesions are identified in the liver measuring up to 15 mm. Larger lesions are most suggestive of cysts. The smaller lesions cannot be definitively characterized. Multiple calcified gallstones measure up to about 13 mm diameter. No intrahepatic or extrahepatic biliary dilation.  Pancreas:  No focal mass lesion. No dilatation of the main duct. No intraparenchymal cyst. No peripancreatic edema. Spleen: No splenomegaly. No focal mass lesion. Adrenals/Urinary Tract: No adrenal nodule or mass. Kidneys unremarkable. No evidence for hydroureter. Mild circumferential wall thickening noted in the bladder. Subtle 10 mm soft tissue nodule is identified along the posterior left bladder wall, inferior to the left UVJ (see image 116/series 2 and coronal image 67/series 4. Stomach/Bowel: Tiny hiatal hernia. Stomach otherwise unremarkable. Duodenum is normally positioned as is the ligament of Treitz. No small bowel wall thickening. No small bowel dilatation. The terminal ileum is normal. The appendix is normal. Scattered diverticuli are seen in the right and left colon without diverticulitis. Vascular/Lymphatic: There is abdominal aortic atherosclerosis without aneurysm. There is no gastrohepatic or hepatoduodenal ligament lymphadenopathy. No intraperitoneal or retroperitoneal lymphadenopathy. No pelvic sidewall lymphadenopathy. Reproductive: The prostate gland and seminal vesicles have normal imaging features. Other: No intraperitoneal free fluid. Musculoskeletal: Bone windows reveal no worrisome lytic or sclerotic osseous lesions. IMPRESSION: 1. Bilateral pulmonary nodules and masses, right greater than left are associated with right-sided pleural disease and small right pleural effusion. Metastatic disease in this patient with history of bladder cancer would be a distinct consideration although thoracic primary not excluded. 2. Lower paraesophageal lymphadenopathy 3. Mild circumferential bladder wall thickening with a 10 mm apparent bladder wall nodule posteriorly on the left. 4. No evidence for metastatic disease in the abdomen or pelvis. 5. Cholelithiasis. 6.  Aortic Atherosclerois (ICD10-170.0) Electronically Signed   By: Misty Stanley M.D.   On: 04/26/2018 15:29   Nm Pet Image Initial (pi) Skull  Base To Thigh  Result Date: 05/10/2018 CLINICAL DATA:  Initial treatment strategy for metastatic disease of unknown primary. History of bladder cancer. EXAM: NUCLEAR MEDICINE PET SKULL BASE TO THIGH TECHNIQUE: 9.6 mCi F-18 FDG was injected intravenously. Full-ring PET imaging was performed from the skull base to thigh after the radiotracer. CT data was obtained and used for attenuation correction and anatomic localization. Fasting blood glucose: 96 mg/dl COMPARISON:  04/26/2018 chest abdomen and pelvic CTs. FINDINGS: Mediastinal blood pool activity: SUV max 2.7 NECK: No areas of abnormal hypermetabolism. Incidental CT findings: No cervical adenopathy. CHEST: Extensive right-sided pleural metastasis with small right-sided pleural effusion. Index inferior right sided pleural implant measures on the order of 3.4 cm and a S.U.V. max of 12.9 on image 89/4. Thoracic nodal metastasis, with an index subcarinal node measuring 7 mm and a S.U.V. max of 3.9 on image 76/4. Bilateral pulmonary parenchymal metastasis. Right apical lung mass measures 2.9 cm and a S.U.V. max of 12.9 on image 58/4. A left upper lobe pulmonary nodule measures 1.3 cm and a S.U.V. max of 12.5 on image 64/4. Incidental CT findings: The right-sided pleural effusion is slightly increased. Other chest findings deferred to recent diagnostic CT. ABDOMEN/PELVIS: No areas of abnormal hypermetabolism. Incidental CT findings: Multiple gallstones. Normal adrenal glands. Abdominopelvic findings deferred to recent diagnostic CT. SKELETON: No abnormal marrow activity. Incidental CT findings: No focal osseous lesion. IMPRESSION: 1. Widespread metastatic disease within the chest, including bilateral pulmonary parenchymal, thoracic nodal, and right-sided pleural disease. 2. No evidence of extrathoracic metastatic disease. Electronically Signed   By: Abigail Miyamoto M.D.   On: 05/10/2018 14:41   Ct Biopsy  Result Date: 05/06/2018 INDICATION: History of bladder cancer,  now with multiple pulmonary and pleural nodules and masses worrisome for metastatic disease. Please perform CT-guided biopsy of dominant pleural mass for tissue diagnostic purposes. EXAM: CT GUIDED RIGHT PLEURAL MASS BIOPSY  COMPARISON:  CT of the chest, abdomen and pelvis - 04/26/2018 MEDICATIONS: None. ANESTHESIA/SEDATION: Fentanyl 50 mcg IV; Versed 1 mg IV Sedation time: 14 minutes; The patient was continuously monitored during the procedure by the interventional radiology nurse under my direct supervision. CONTRAST:  None. COMPLICATIONS: None immediate. PROCEDURE: Informed consent was obtained from the patient following an explanation of the procedure, risks, benefits and alternatives. A time out was performed prior to the initiation of the procedure. The patient was positioned prone on the CT table and a limited CT was performed for procedural planning demonstrating grossly unchanged size and appearance of the approximately 4.4 x 2.1 cm mass within the posterior basilar aspect of the right pleura (image 45, series 2). The procedure was planned. The operative site was prepped and draped in the usual sterile fashion. Appropriate trajectory was confirmed with a 22 gauge spinal needle after the adjacent tissues were anesthetized with 1% Lidocaine with epinephrine. Under intermittent CT guidance, a 17 gauge coaxial needle was advanced into the peripheral aspect of the mass. Appropriate positioning was confirmed and 5 core needle biopsy samples were obtained with an 18 gauge core needle biopsy device. The co-axial needle was removed and hemostasis was achieved with manual compression. A limited postprocedural CT was negative for pneumothorax, hemorrhage or additional complication. A dressing was placed. The patient tolerated the procedure well without immediate postprocedural complication. IMPRESSION: Technically successful CT guided core needle biopsy of right posterior basilar pleural mass. Electronically Signed    By: Sandi Mariscal M.D.   On: 05/06/2018 10:37    ASSESSMENT & PLAN:  72 y.o. male with  1. 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   -04/26/18 CT C/A/P which revealed Bilateral pulmonary nodules and masses, right greater than left are associated with right-sided pleural disease and small right pleural effusion.  -CXR 02/27/17 did not reveal any lung masses -Pathology from March 2018 showed a small cell component and invasion into the urinary bladder muscle -PET from 05/10/18 which shows wide spread metastatic disease in the chest, including bilateral pulmonary parenchymal, thoracic nodal, and right-sided pleural disease. No evidence of extrathoracic metastatic disease. 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 He has recurrent now high grade metastatic disease to the lung.   2. Intractable cough from pulmonary metastases -On tessalon perles with some relief  PLAN -We discussed pt's PET from 05/10/18 which shows wide spread metastatic disease in the chest, including bilateral pulmonary parenchymal, thoracic nodal, and right-sided pleural disease. No evidence of extrathoracic metastatic disease. 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 Scan also shows fluid collection around his lungs which is likely to contribute.  -I discussed this new diagnosis of metastatic stage IV Urothelial bladder cancer which is no longer curable, but still treatable.  -I discussed standard of care for first-line treatment would include Cisplatin. Given his CKD I  recommend renally adjusted Carboplatin + Gemcitabine every 3 weeks for upto 6 cycles if tolerable. He is agreeable.  - further testing of his biopsy sample for MMR panel to see if he is eligible for immunotherapy- pending -Goal of treatment would be palliative  to control his disease. if good response to treatment this should help improve his cough and cancer symptoms as well.  -Given the poor prognosis I discussed he should have advanced directives set in place.  -I also offered social work resources if he needs any financial support.  -Chemo-counseling for Carboplatin/Gemcitabine for newly diagnosed metastatic urothelial carcinoma. -IR for port-a-cath placement -Discussed pt labwork today, 05/19/18; anemia with Hgb at 10.1, blood counts and chemistries are otherwise stable.  -Discussed 05/19/18 Brain MRI which revealed No evidence of intracranial metastatic disease on this unenhanced examination.  -Dose reduced Carboplatin for kidney tolerance, and Gemcitabine  -Neulasta support on D9 -Discussed dosing schedule in detail with pt and his family -Will repeat PET after C3 and will complete maximum of 6 cycles of this treatment. -Will prescribe Marinol for appetite stimulation -Recommend that pt hydrate well, eat well and walk 30 minutes each day -Discussed with pt and family that treatment is not curative, but is palliative.  -The pt to start with C1 at this time.  -Will see pt back on C1D8 with labs for a toxicity check   Please schedule C1D8 of gemcitabine per orders and D9 neulasta RTC with Dr Irene Limbo on C1D8 on 6/19 with labs for toxicity check Please schedule C2 of treatment per orders with labs    All of the patients questions were answered with apparent satisfaction. The patient knows to call the clinic with any problems, questions or concerns.  The toal time spent in the appt was 30 minutes and more than 50% was on counseling and direct patient cares.   Sullivan Lone MD MS AAHIVMS [facility]  [facility] Hematology/Oncology Physician [facility]  (Office):       [phone] (Work cell):  [phone] (Fax):           [phone]  05/19/2018 1:47 PM  I, Baldwin Jamaica, am acting as a Education administrator for Dr Irene Limbo.   .I have reviewed the above documentation for accuracy and completeness, and I agree with the above. Brunetta Genera MD

## 2018-05-18 NOTE — Progress Notes (Signed)
START ON PATHWAY REGIMEN - Bladder     A cycle is every 21 days:     Carboplatin      Gemcitabine   **Always confirm dose/schedule in your pharmacy ordering system**  Patient Characteristics: Metastatic Disease, First Line, No Prior Neoadjuvant/Adjuvant Therapy, Poor Renal Function (CrCl < 50 mL/min), Unknown PD-L1 Expression AJCC M Category: M1b AJCC N Category: NX AJCC T Category: TX Current evidence of distant metastases<= Yes AJCC 8 Stage Grouping: IVB Line of Therapy: First Line Prior Neoadjuvant/Adjuvant Therapy<= No Renal Function: Poor Renal Function (CrCl < 50 mL/min) PD-L1 Expression Status: Unknown PD-L1 Expression Intent of Therapy: Non-Curative / Palliative Intent, Discussed with Patient 

## 2018-05-19 ENCOUNTER — Inpatient Hospital Stay: Payer: Medicare Other

## 2018-05-19 ENCOUNTER — Ambulatory Visit (HOSPITAL_COMMUNITY)
Admission: RE | Admit: 2018-05-19 | Discharge: 2018-05-19 | Disposition: A | Discharge status: Home / Self Care | Payer: Medicare Other | Source: Ambulatory Visit | Attending: Hematology | Admitting: Hematology

## 2018-05-19 ENCOUNTER — Other Ambulatory Visit: Payer: Self-pay | Admitting: Radiology

## 2018-05-19 ENCOUNTER — Other Ambulatory Visit: Payer: Self-pay

## 2018-05-19 ENCOUNTER — Inpatient Hospital Stay (HOSPITAL_BASED_OUTPATIENT_CLINIC_OR_DEPARTMENT_OTHER): Payer: Medicare Other | Admitting: Hematology

## 2018-05-19 VITALS — BP 104/53 | HR 83 | Temp 99.4°F | Resp 18 | Ht 72.0 in | Wt 173.9 lb

## 2018-05-19 DIAGNOSIS — C7910 Secondary malignant neoplasm of unspecified urinary organs: Secondary | ICD-10-CM | POA: Diagnosis present

## 2018-05-19 DIAGNOSIS — R05 Cough: Secondary | ICD-10-CM | POA: Diagnosis not present

## 2018-05-19 DIAGNOSIS — D649 Anemia, unspecified: Secondary | ICD-10-CM | POA: Diagnosis not present

## 2018-05-19 DIAGNOSIS — R630 Anorexia: Secondary | ICD-10-CM

## 2018-05-19 DIAGNOSIS — C679 Malignant neoplasm of bladder, unspecified: Secondary | ICD-10-CM

## 2018-05-19 DIAGNOSIS — C7800 Secondary malignant neoplasm of unspecified lung: Secondary | ICD-10-CM

## 2018-05-19 DIAGNOSIS — Z5111 Encounter for antineoplastic chemotherapy: Secondary | ICD-10-CM | POA: Diagnosis not present

## 2018-05-19 DIAGNOSIS — Z5189 Encounter for other specified aftercare: Secondary | ICD-10-CM | POA: Diagnosis not present

## 2018-05-19 DIAGNOSIS — N181 Chronic kidney disease, stage 1: Secondary | ICD-10-CM

## 2018-05-19 DIAGNOSIS — I6782 Cerebral ischemia: Secondary | ICD-10-CM | POA: Diagnosis not present

## 2018-05-19 DIAGNOSIS — C678 Malignant neoplasm of overlapping sites of bladder: Secondary | ICD-10-CM

## 2018-05-19 DIAGNOSIS — Z7189 Other specified counseling: Secondary | ICD-10-CM

## 2018-05-19 LAB — CBC WITH DIFFERENTIAL/PLATELET
Basophils Absolute: 0.1 10*3/uL (ref 0.0–0.1)
Basophils Relative: 1 %
Eosinophils Absolute: 0.1 10*3/uL (ref 0.0–0.5)
Eosinophils Relative: 2 %
HCT: 32.2 % — ABNORMAL LOW (ref 38.4–49.9)
Hemoglobin: 10.1 g/dL — ABNORMAL LOW (ref 13.0–17.1)
Lymphocytes Relative: 21 %
Lymphs Abs: 1.5 10*3/uL (ref 0.9–3.3)
MCH: 25.3 pg — ABNORMAL LOW (ref 27.2–33.4)
MCHC: 31.4 g/dL — ABNORMAL LOW (ref 32.0–36.0)
MCV: 80.5 fL (ref 79.3–98.0)
Monocytes Absolute: 0.8 10*3/uL (ref 0.1–0.9)
Monocytes Relative: 11 %
Neutro Abs: 4.7 10*3/uL (ref 1.5–6.5)
Neutrophils Relative %: 65 %
Platelets: 363 10*3/uL (ref 140–400)
RBC: 4 MIL/uL — ABNORMAL LOW (ref 4.20–5.82)
RDW: 15.2 % — ABNORMAL HIGH (ref 11.0–14.6)
WBC: 7.2 10*3/uL (ref 4.0–10.3)

## 2018-05-19 LAB — CMP (CANCER CENTER ONLY)
ALT: 11 U/L (ref 0–55)
AST: 17 U/L (ref 5–34)
Albumin: 3.2 g/dL — ABNORMAL LOW (ref 3.5–5.0)
Alkaline Phosphatase: 75 U/L (ref 40–150)
Anion gap: 11 (ref 3–11)
BUN: 19 mg/dL (ref 7–26)
CO2: 28 mmol/L (ref 22–29)
Calcium: 9.5 mg/dL (ref 8.4–10.4)
Chloride: 103 mmol/L (ref 98–109)
Creatinine: 1.63 mg/dL — ABNORMAL HIGH (ref 0.70–1.30)
GFR, Est AFR Am: 48 mL/min — ABNORMAL LOW (ref >60)
GFR, Estimated: 41 mL/min — ABNORMAL LOW (ref >60)
Glucose, Bld: 110 mg/dL (ref 70–140)
Potassium: 3.9 mmol/L (ref 3.5–5.1)
Sodium: 142 mmol/L (ref 136–145)
Total Bilirubin: 0.3 mg/dL (ref 0.2–1.2)
Total Protein: 7.7 g/dL (ref 6.4–8.3)

## 2018-05-19 LAB — MAGNESIUM: Magnesium: 2.3 mg/dL (ref 1.7–2.4)

## 2018-05-19 LAB — PHOSPHORUS: Phosphorus: 2.7 mg/dL (ref 2.5–4.6)

## 2018-05-19 MED ORDER — SODIUM CHLORIDE 0.9 % IV SOLN
Freq: Once | INTRAVENOUS | Status: AC
  Administered 2018-05-19: 15:00:00 via INTRAVENOUS

## 2018-05-19 MED ORDER — SODIUM CHLORIDE 0.9 % IV SOLN
800.0000 mg/m2 | Freq: Once | INTRAVENOUS | Status: AC
  Administered 2018-05-19: 1710 mg via INTRAVENOUS
  Filled 2018-05-19: qty 44.97

## 2018-05-19 MED ORDER — DRONABINOL 5 MG PO CAPS: 5.0000 mg | ORAL_CAPSULE | Freq: Two times a day (BID) | ORAL | 0 refills | Status: AC

## 2018-05-19 MED ORDER — SODIUM CHLORIDE 0.9 % IV SOLN
Freq: Once | INTRAVENOUS | Status: AC
  Administered 2018-05-19: 15:00:00 via INTRAVENOUS
  Filled 2018-05-19: qty 5

## 2018-05-19 MED ORDER — BENZONATATE 100 MG PO CAPS: 100.0000 mg | ORAL_CAPSULE | Freq: Three times a day (TID) | ORAL | 0 refills | Status: AC | PRN

## 2018-05-19 MED ORDER — CARBOPLATIN CHEMO INJECTION 450 MG/45ML
350.0000 mg | Freq: Once | INTRAVENOUS | Status: AC
  Administered 2018-05-19: 350 mg via INTRAVENOUS
  Filled 2018-05-19: qty 35

## 2018-05-19 MED ORDER — PALONOSETRON HCL INJECTION 0.25 MG/5ML
INTRAVENOUS | Status: AC
  Filled 2018-05-19: qty 5

## 2018-05-19 MED ORDER — PALONOSETRON HCL INJECTION 0.25 MG/5ML
0.2500 mg | Freq: Once | INTRAVENOUS | Status: AC
  Administered 2018-05-19: 0.25 mg via INTRAVENOUS

## 2018-05-19 NOTE — Progress Notes (Signed)
Dr. Irene Limbo ok to tx with Ctn 1.63.

## 2018-05-19 NOTE — Patient Instructions (Signed)
[facility] Discharge Instructions for Patients Receiving Chemotherapy  Today you received the following chemotherapy agents gemcitabine (Gemzar) and carboplatin (Paraplatin)  To help prevent nausea and vomiting after your treatment, we encourage you to take your nausea medication as directed by your doctor.   If you develop nausea and vomiting that is not controlled by your nausea medication, call the clinic.   BELOW ARE SYMPTOMS THAT SHOULD BE REPORTED IMMEDIATELY:  *FEVER GREATER THAN 100.5 F  *CHILLS WITH OR WITHOUT FEVER  NAUSEA AND VOMITING THAT IS NOT CONTROLLED WITH YOUR NAUSEA MEDICATION  *UNUSUAL SHORTNESS OF BREATH  *UNUSUAL BRUISING OR BLEEDING  TENDERNESS IN MOUTH AND THROAT WITH OR WITHOUT PRESENCE OF ULCERS  *URINARY PROBLEMS  *BOWEL PROBLEMS  UNUSUAL RASH Items with * indicate a potential emergency and should be followed up as soon as possible.  Feel free to call the clinic should you have any questions or concerns. The clinic phone number is [phone].  Please show the [employer] at check-in to the Emergency Department and triage nurse.  Gemcitabine injection What is this medicine? GEMCITABINE (jem SIT a been) is a chemotherapy drug. This medicine is used to treat many types of cancer like breast cancer, lung cancer, pancreatic cancer, and ovarian cancer. This medicine may be used for other purposes; ask your health care provider or pharmacist if you have questions. COMMON BRAND NAME(S): Gemzar What should I tell my health care provider before I take this medicine? They need to know if you have any of these conditions: -blood disorders -infection -kidney disease -liver disease -recent or ongoing radiation therapy -an unusual or allergic reaction to gemcitabine, other chemotherapy, other medicines, foods, dyes, or preservatives -pregnant or trying to get pregnant -breast-feeding How should I use this medicine? This drug is  given as an infusion into a vein. It is administered in a hospital or clinic by a specially trained health care professional. Talk to your pediatrician regarding the use of this medicine in children. Special care may be needed. Overdosage: If you think you have taken too much of this medicine contact a poison control center or emergency room at once. NOTE: This medicine is only for you. Do not share this medicine with others. What if I miss a dose? It is important not to miss your dose. Call your doctor or health care professional if you are unable to keep an appointment. What may interact with this medicine? -medicines to increase blood counts like filgrastim, pegfilgrastim, sargramostim -some other chemotherapy drugs like cisplatin -vaccines Talk to your doctor or health care professional before taking any of these medicines: -acetaminophen -aspirin -ibuprofen -ketoprofen -naproxen This list may not describe all possible interactions. Give your health care provider a list of all the medicines, herbs, non-prescription drugs, or dietary supplements you use. Also tell them if you smoke, drink alcohol, or use illegal drugs. Some items may interact with your medicine. What should I watch for while using this medicine? Visit your doctor for checks on your progress. This drug may make you feel generally unwell. This is not uncommon, as chemotherapy can affect healthy cells as well as cancer cells. Report any side effects. Continue your course of treatment even though you feel ill unless your doctor tells you to stop. In some cases, you may be given additional medicines to help with side effects. Follow all directions for their use. Call your doctor or health care professional for advice if you get a fever, chills or sore throat,  or other symptoms of a cold or flu. Do not treat yourself. This drug decreases your body's ability to fight infections. Try to avoid being around people who are sick. This  medicine may increase your risk to bruise or bleed. Call your doctor or health care professional if you notice any unusual bleeding. Be careful brushing and flossing your teeth or using a toothpick because you may get an infection or bleed more easily. If you have any dental work done, tell your dentist you are receiving this medicine. Avoid taking products that contain aspirin, acetaminophen, ibuprofen, naproxen, or ketoprofen unless instructed by your doctor. These medicines may hide a fever. Women should inform their doctor if they wish to become pregnant or think they might be pregnant. There is a potential for serious side effects to an unborn child. Talk to your health care professional or pharmacist for more information. Do not breast-feed an infant while taking this medicine. What side effects may I notice from receiving this medicine? Side effects that you should report to your doctor or health care professional as soon as possible: -allergic reactions like skin rash, itching or hives, swelling of the face, lips, or tongue -low blood counts - this medicine may decrease the number of white blood cells, red blood cells and platelets. You may be at increased risk for infections and bleeding. -signs of infection - fever or chills, cough, sore throat, pain or difficulty passing urine -signs of decreased platelets or bleeding - bruising, pinpoint red spots on the skin, black, tarry stools, blood in the urine -signs of decreased red blood cells - unusually weak or tired, fainting spells, lightheadedness -breathing problems -chest pain -mouth sores -nausea and vomiting -pain, swelling, redness at site where injected -pain, tingling, numbness in the hands or feet -stomach pain -swelling of ankles, feet, hands -unusual bleeding Side effects that usually do not require medical attention (report to your doctor or health care professional if they continue or are  bothersome): -constipation -diarrhea -hair loss -loss of appetite -stomach upset This list may not describe all possible side effects. Call your doctor for medical advice about side effects. You may report side effects to FDA at 1-800-FDA-1088. Where should I keep my medicine? This drug is given in a hospital or clinic and will not be stored at home. NOTE: This sheet is a summary. It may not cover all possible information. If you have questions about this medicine, talk to your doctor, pharmacist, or health care provider.  2018 Elsevier/Gold Standard (2008-04-04 18:45:54)  Carboplatin injection What is this medicine? CARBOPLATIN (KAR boe pla tin) is a chemotherapy drug. It targets fast dividing cells, like cancer cells, and causes these cells to die. This medicine is used to treat ovarian cancer and many other cancers. This medicine may be used for other purposes; ask your health care provider or pharmacist if you have questions. COMMON BRAND NAME(S): Paraplatin What should I tell my health care provider before I take this medicine? They need to know if you have any of these conditions: -blood disorders -hearing problems -kidney disease -recent or ongoing radiation therapy -an unusual or allergic reaction to carboplatin, cisplatin, other chemotherapy, other medicines, foods, dyes, or preservatives -pregnant or trying to get pregnant -breast-feeding How should I use this medicine? This drug is usually given as an infusion into a vein. It is administered in a hospital or clinic by a specially trained health care professional. Talk to your pediatrician regarding the use of this medicine in children. Special  care may be needed. Overdosage: If you think you have taken too much of this medicine contact a poison control center or emergency room at once. NOTE: This medicine is only for you. Do not share this medicine with others. What if I miss a dose? It is important not to miss a dose.  Call your doctor or health care professional if you are unable to keep an appointment. What may interact with this medicine? -medicines for seizures -medicines to increase blood counts like filgrastim, pegfilgrastim, sargramostim -some antibiotics like amikacin, gentamicin, neomycin, streptomycin, tobramycin -vaccines Talk to your doctor or health care professional before taking any of these medicines: -acetaminophen -aspirin -ibuprofen -ketoprofen -naproxen This list may not describe all possible interactions. Give your health care provider a list of all the medicines, herbs, non-prescription drugs, or dietary supplements you use. Also tell them if you smoke, drink alcohol, or use illegal drugs. Some items may interact with your medicine. What should I watch for while using this medicine? Your condition will be monitored carefully while you are receiving this medicine. You will need important blood work done while you are taking this medicine. This drug may make you feel generally unwell. This is not uncommon, as chemotherapy can affect healthy cells as well as cancer cells. Report any side effects. Continue your course of treatment even though you feel ill unless your doctor tells you to stop. In some cases, you may be given additional medicines to help with side effects. Follow all directions for their use. Call your doctor or health care professional for advice if you get a fever, chills or sore throat, or other symptoms of a cold or flu. Do not treat yourself. This drug decreases your body's ability to fight infections. Try to avoid being around people who are sick. This medicine may increase your risk to bruise or bleed. Call your doctor or health care professional if you notice any unusual bleeding. Be careful brushing and flossing your teeth or using a toothpick because you may get an infection or bleed more easily. If you have any dental work done, tell your dentist you are receiving this  medicine. Avoid taking products that contain aspirin, acetaminophen, ibuprofen, naproxen, or ketoprofen unless instructed by your doctor. These medicines may hide a fever. Do not become pregnant while taking this medicine. Women should inform their doctor if they wish to become pregnant or think they might be pregnant. There is a potential for serious side effects to an unborn child. Talk to your health care professional or pharmacist for more information. Do not breast-feed an infant while taking this medicine. What side effects may I notice from receiving this medicine? Side effects that you should report to your doctor or health care professional as soon as possible: -allergic reactions like skin rash, itching or hives, swelling of the face, lips, or tongue -signs of infection - fever or chills, cough, sore throat, pain or difficulty passing urine -signs of decreased platelets or bleeding - bruising, pinpoint red spots on the skin, black, tarry stools, nosebleeds -signs of decreased red blood cells - unusually weak or tired, fainting spells, lightheadedness -breathing problems -changes in hearing -changes in vision -chest pain -high blood pressure -low blood counts - This drug may decrease the number of white blood cells, red blood cells and platelets. You may be at increased risk for infections and bleeding. -nausea and vomiting -pain, swelling, redness or irritation at the injection site -pain, tingling, numbness in the hands or feet -problems with  balance, talking, walking -trouble passing urine or change in the amount of urine Side effects that usually do not require medical attention (report to your doctor or health care professional if they continue or are bothersome): -hair loss -loss of appetite -metallic taste in the mouth or changes in taste This list may not describe all possible side effects. Call your doctor for medical advice about side effects. You may report side effects to  FDA at 1-800-FDA-1088. Where should I keep my medicine? This drug is given in a hospital or clinic and will not be stored at home. NOTE: This sheet is a summary. It may not cover all possible information. If you have questions about this medicine, talk to your doctor, pharmacist, or health care provider.  2018 Elsevier/Gold Standard (2008-02-29 14:38:05)

## 2018-05-20 ENCOUNTER — Telehealth: Payer: Self-pay

## 2018-05-20 ENCOUNTER — Inpatient Hospital Stay: Payer: Medicare Other

## 2018-05-20 NOTE — Telephone Encounter (Signed)
Jeremy Patrick is working on 6/12 los

## 2018-05-21 ENCOUNTER — Encounter (HOSPITAL_COMMUNITY): Payer: Self-pay

## 2018-05-21 ENCOUNTER — Ambulatory Visit (HOSPITAL_COMMUNITY)
Admission: RE | Admit: 2018-05-21 | Discharge: 2018-05-21 | Disposition: A | Discharge status: Home / Self Care | Payer: Medicare Other | Source: Ambulatory Visit | Attending: Hematology | Admitting: Hematology

## 2018-05-21 ENCOUNTER — Other Ambulatory Visit: Payer: Self-pay | Admitting: Hematology

## 2018-05-21 DIAGNOSIS — C689 Malignant neoplasm of urinary organ, unspecified: Secondary | ICD-10-CM | POA: Diagnosis not present

## 2018-05-21 DIAGNOSIS — Z5111 Encounter for antineoplastic chemotherapy: Secondary | ICD-10-CM | POA: Diagnosis not present

## 2018-05-21 DIAGNOSIS — Z87891 Personal history of nicotine dependence: Secondary | ICD-10-CM | POA: Diagnosis not present

## 2018-05-21 DIAGNOSIS — I10 Essential (primary) hypertension: Secondary | ICD-10-CM | POA: Diagnosis not present

## 2018-05-21 DIAGNOSIS — C7910 Secondary malignant neoplasm of unspecified urinary organs: Secondary | ICD-10-CM

## 2018-05-21 DIAGNOSIS — K219 Gastro-esophageal reflux disease without esophagitis: Secondary | ICD-10-CM | POA: Insufficient documentation

## 2018-05-21 DIAGNOSIS — C679 Malignant neoplasm of bladder, unspecified: Secondary | ICD-10-CM | POA: Diagnosis not present

## 2018-05-21 DIAGNOSIS — Z452 Encounter for adjustment and management of vascular access device: Secondary | ICD-10-CM | POA: Diagnosis not present

## 2018-05-21 HISTORY — PX: IR IMAGING GUIDED PORT INSERTION: IMG5740

## 2018-05-21 LAB — CBC
HCT: 34.1 % — ABNORMAL LOW (ref 39.0–52.0)
Hemoglobin: 10.8 g/dL — ABNORMAL LOW (ref 13.0–17.0)
MCH: 25.3 pg — ABNORMAL LOW (ref 26.0–34.0)
MCHC: 31.7 g/dL (ref 30.0–36.0)
MCV: 79.9 fL (ref 78.0–100.0)
Platelets: 405 10*3/uL — ABNORMAL HIGH (ref 150–400)
RBC: 4.27 MIL/uL (ref 4.22–5.81)
RDW: 15 % (ref 11.5–15.5)
WBC: 13.4 10*3/uL — ABNORMAL HIGH (ref 4.0–10.5)

## 2018-05-21 LAB — PROTIME-INR
INR: 1.03
Prothrombin Time: 13.4 seconds (ref 11.4–15.2)

## 2018-05-21 LAB — APTT: aPTT: 32 seconds (ref 24–36)

## 2018-05-21 MED ORDER — SODIUM CHLORIDE 0.9 % IV SOLN
INTRAVENOUS | Status: DC
  Administered 2018-05-21: 11:00:00 via INTRAVENOUS

## 2018-05-21 MED ORDER — LIDOCAINE HCL 1 % IJ SOLN
INTRAMUSCULAR | Status: AC
  Filled 2018-05-21: qty 20

## 2018-05-21 MED ORDER — NALOXONE HCL 0.4 MG/ML IJ SOLN
INTRAMUSCULAR | Status: AC
  Filled 2018-05-21: qty 1

## 2018-05-21 MED ORDER — MIDAZOLAM HCL 2 MG/2ML IJ SOLN
INTRAMUSCULAR | Status: AC
  Filled 2018-05-21: qty 4

## 2018-05-21 MED ORDER — MIDAZOLAM HCL 2 MG/2ML IJ SOLN
INTRAMUSCULAR | Status: AC | PRN
  Administered 2018-05-21 (×2): 1 mg via INTRAVENOUS

## 2018-05-21 MED ORDER — FLUMAZENIL 0.5 MG/5ML IV SOLN
INTRAVENOUS | Status: AC
  Filled 2018-05-21: qty 5

## 2018-05-21 MED ORDER — HEPARIN SOD (PORK) LOCK FLUSH 100 UNIT/ML IV SOLN
INTRAVENOUS | Status: AC
  Filled 2018-05-21: qty 5

## 2018-05-21 MED ORDER — FENTANYL CITRATE (PF) 100 MCG/2ML IJ SOLN
INTRAMUSCULAR | Status: AC | PRN
  Administered 2018-05-21 (×2): 50 ug via INTRAVENOUS

## 2018-05-21 MED ORDER — FENTANYL CITRATE (PF) 100 MCG/2ML IJ SOLN
INTRAMUSCULAR | Status: AC
  Filled 2018-05-21: qty 4

## 2018-05-21 MED ORDER — LIDOCAINE-EPINEPHRINE (PF) 2 %-1:200000 IJ SOLN
INTRAMUSCULAR | Status: AC
  Filled 2018-05-21: qty 20

## 2018-05-21 MED ORDER — CEFAZOLIN SODIUM-DEXTROSE 2-4 GM/100ML-% IV SOLN
INTRAVENOUS | Status: AC
  Administered 2018-05-21: 2 g via INTRAVENOUS
  Filled 2018-05-21: qty 100

## 2018-05-21 MED ORDER — CEFAZOLIN SODIUM-DEXTROSE 2-4 GM/100ML-% IV SOLN
2.0000 g | Freq: Once | INTRAVENOUS | Status: AC
  Administered 2018-05-21: 2 g via INTRAVENOUS

## 2018-05-21 NOTE — Procedures (Signed)
Met bladder ca  S/p RT IJ POWER PORT  Tip svcra  No comp Stable EBL min Ready for use Full report in pacs

## 2018-05-21 NOTE — H&P (Signed)
Chief Complaint: Patient was seen in consultation today for port placement at the request of Brunetta Genera  Referring Physician(s): Brunetta Genera  Supervising Physician: Daryll Brod  Patient Status: [facility] - Out-pt  History of Present Illness: Jeremy Patrick is a 72 y.o. male with metastatic bladder cancer. He is to start palliative chemotherapy and is referred for port placement PMHx, meds, labs, imaging, allergies reviewed. Feels well, no recent fevers, chills, illness. Has been NPO today as directed. Family at bedside.   Past Medical History:  Diagnosis Date  . Cancer [facility])    bladder cancer  . GERD (gastroesophageal reflux disease)   . Hypertension     Past Surgical History:  Procedure Laterality Date  . BLADDER SURGERY    . CYSTOSCOPY N/A 04/06/2017   Procedure: CYSTOSCOPY;  Surgeon: Raynelle Bring, MD;  Location: [facility];  Service: Urology;  Laterality: N/A;  . CYSTOSCOPY W/ RETROGRADES N/A 01/28/2018   Procedure: CYSTOSCOPY WITH RETROGRADE PYELOGRAM;  Surgeon: Raynelle Bring, MD;  Location: [facility];  Service: Urology;  Laterality: N/A;  . CYSTOSCOPY W/ URETERAL STENT PLACEMENT N/A 10/08/2017   Procedure: CYSTOSCOPY WITH BILATERAL  RETROGRADE PYELOGRAM/ LEFT URETERAL STENT PLACEMENT;  Surgeon: Raynelle Bring, MD;  Location: [facility];  Service: Urology;  Laterality: N/A;  . TRANSURETHRAL RESECTION OF BLADDER TUMOR N/A 04/06/2017   Procedure: TRANSURETHRAL RESECTION OF BLADDER TUMOR (TURBT);  Surgeon: Raynelle Bring, MD;  Location: [facility];  Service: Urology;  Laterality: N/A;  . TRANSURETHRAL RESECTION OF BLADDER TUMOR N/A 10/08/2017   Procedure: TRANSURETHRAL RESECTION OF BLADDER TUMOR (TURBT);  Surgeon: Raynelle Bring, MD;  Location: [facility];  Service: Urology;  Laterality: N/A;  . TRANSURETHRAL RESECTION OF BLADDER TUMOR N/A 01/28/2018   Procedure: TRANSURETHRAL RESECTION OF BLADDER TUMOR (TURBT);  Surgeon: Raynelle Bring, MD;  Location: [facility];  Service: Urology;   Laterality: N/A;  GENERAL ANESTHESIA WITH PARALYSIS  . TRANSURETHRAL RESECTION OF BLADDER TUMOR WITH MITOMYCIN-C N/A 03/02/2017   Procedure: TRANSURETHRAL RESECTION OF BLADDER TUMOR/ EXAM UNDER ANESTHESIA/ WITH MITOMYCIN-C POST OPERATIVE;  Surgeon: Raynelle Bring, MD;  Location: [facility];  Service: Urology;  Laterality: N/A;    Allergies: Patient has no known allergies.  Medications: Prior to Admission medications   Medication Sig Start Date End Date Taking? Authorizing Provider  benzonatate (TESSALON) 100 MG capsule Take 1 capsule (100 mg total) by mouth 3 (three) times daily as needed for cough. 05/19/18  Yes Brunetta Genera, MD  Cyanocobalamin (VITAMIN B 12 PO) Take 1 tablet by mouth daily.   Yes [provider]  dexamethasone (DECADRON) 4 MG tablet Take 2 tablets (8 mg total) by mouth daily. Start the day after carboplatin chemotherapy for 2 days. 05/18/18  Yes Brunetta Genera, MD  diltiazem (CARDIZEM CD) 360 MG 24 hr capsule Take 1 capsule (360 mg total) by mouth daily. Must keep scheduled appt w/new provider for future refills 04/30/18  Yes Martinique, Betty G, MD  Ferrous Gluconate (IRON 27 PO) Take 27 mg by mouth daily.   Yes [provider]  Glucos-Chondroit-Hyaluron-MSM (GLUCOSAMINE CHONDROITIN JOINT PO) Take 1,500 mg by mouth daily.   Yes [provider]  LORazepam (ATIVAN) 0.5 MG tablet Take 1 tablet (0.5 mg total) by mouth every 6 (six) hours as needed (Nausea or vomiting). 05/18/18  Yes Brunetta Genera, MD  ondansetron (ZOFRAN) 8 MG tablet Take 1 tablet (8 mg total) by mouth 2 (two) times daily as needed for refractory nausea / vomiting. Start on day 3 after carboplatin  chemo. 05/18/18  Yes Brunetta Genera, MD  ondansetron (ZOFRAN-ODT) 4 MG disintegrating tablet Take 1 tablet (4 mg total) by mouth every 8 (eight) hours as needed for nausea or vomiting. 04/28/18  Yes Brunetta Genera, MD  prochlorperazine (COMPAZINE) 10 MG tablet Take 1 tablet  (10 mg total) by mouth every 6 (six) hours as needed (Nausea or vomiting). 05/18/18  Yes Brunetta Genera, MD  ranitidine (ZANTAC) 300 MG tablet Take 1 tablet (300 mg total) by mouth every morning. Must keep appt for future refills 04/30/18  Yes Martinique, Betty G, MD  sildenafil (VIAGRA) 50 MG tablet Take 1 tablet (50 mg total) by mouth daily as needed for erectile dysfunction. 04/20/18  Yes [place], Betty G, MD  dronabinol (MARINOL) 5 MG capsule Take 1 capsule (5 mg total) by mouth 2 (two) times daily before a meal. 05/19/18   Brunetta Genera, MD  guaiFENesin-dextromethorphan [facility] DM) 100-10 MG/5ML syrup Take 5 mLs by mouth 3 (three) times daily as needed for cough. Patient not taking: Reported on 05/06/2018 04/26/18   Davonna Belling, MD  lidocaine-prilocaine (EMLA) cream Apply to affected area once 05/18/18   Brunetta Genera, MD     Family History  Problem Relation Age of Onset  . Hypertension Mother   . Stroke Mother   . Diabetes Mother   . Cataracts Mother   . Kidney disease Mother   . Hypertension Father   . Stroke Father   . Aneurysm Maternal Grandmother     Social History   Socioeconomic History  . Marital status: Married    Spouse name: Not on file  . Number of children: 4  . Years of education: 10  . Highest education level: Not on file  Occupational History  . Occupation: Retired  Scientific laboratory technician  . Financial resource strain: Not on file  . Food insecurity:    Worry: Not on file    Inability: Not on file  . Transportation needs:    Medical: Not on file    Non-medical: Not on file  Tobacco Use  . Smoking status: Former Smoker    Packs/day: 0.15    Years: 10.00    Pack years: 1.50    Types: Cigarettes    Last attempt to quit: 1980    Years since quitting: 39.4  . Smokeless tobacco: Never Used  Substance and Sexual Activity  . Alcohol use: Not Currently  . Drug use: No    Comment: "years ago"  . Sexual activity: Not on file  Lifestyle  .  Physical activity:    Days per week: Not on file    Minutes per session: Not on file  . Stress: Not on file  Relationships  . Social connections:    Talks on phone: Not on file    Gets together: Not on file    Attends religious service: Not on file    Active member of club or organization: Not on file    Attends meetings of clubs or organizations: Not on file    Relationship status: Not on file  Other Topics Concern  . Not on file  Social History Narrative   Fun/Hobby: Go to [employer] and travel.      Review of Systems: A 12 point ROS discussed and pertinent positives are indicated in the HPI above.  All other systems are negative.  Review of Systems  Vital Signs: BP 140/80 (BP Location: Left Arm)   Pulse 69   Temp (!) 97.5 F (  36.4 C) (Oral)   Resp 16   SpO2 96%   Physical Exam  Constitutional: He is oriented to person, place, and time. He appears well-developed. No distress.  HENT:  Head: Normocephalic.  Mouth/Throat: Oropharynx is clear and moist.  Neck: Normal range of motion. No JVD present. No tracheal deviation present.  Cardiovascular: Normal rate, regular rhythm and normal heart sounds.  Pulmonary/Chest: Effort normal and breath sounds normal. No respiratory distress.  Neurological: He is alert and oriented to person, place, and time.  Skin: Skin is warm and dry.  Psychiatric: He has a normal mood and affect.     Imaging: Dg Chest 2 View  Result Date: 04/26/2018 CLINICAL DATA:  Cough, shortness of breath EXAM: CHEST - 2 VIEW COMPARISON:  02/27/2017 FINDINGS: Multiple bilateral pulmonary masses and pulmonary nodules with the largest in the right middle lobe measuring 5 cm. Small right pleural effusion. No pneumothorax. Stable cardiomediastinal silhouette. IMPRESSION: Multiple bilateral pulmonary masses and pulmonary nodules most concerning for metastatic disease. Electronically Signed   By: Kathreen Devoid   On: 04/26/2018 14:09   Ct Chest W Contrast  Result Date:  04/26/2018 CLINICAL DATA:  Bladder cancer with productive cough. EXAM: CT CHEST, ABDOMEN, AND PELVIS WITH CONTRAST TECHNIQUE: Multidetector CT imaging of the chest, abdomen and pelvis was performed following the standard protocol during bolus administration of intravenous contrast. CONTRAST:  76mL ISOVUE-300 IOPAMIDOL (ISOVUE-300) INJECTION 61% COMPARISON:  Chest x-ray from earlier today. FINDINGS: CT CHEST FINDINGS Cardiovascular: The heart size is normal. No pericardial effusion. Atherosclerotic calcification is noted in the wall of the thoracic aorta. Mediastinum/Nodes: Upper normal mediastinal lymph nodes evident. No axillary lymphadenopathy. 13 mm short axis right hilar lymph node is identified. There is a 17 mm lymph node in the inferior right hilum (image 32/2). 2.4 cm short axis lower paraesophageal lymph node visible on image 47/series 2. Lungs/Pleura: The central tracheobronchial airways are patent. Multiple bilateral pulmonary nodules and masses are evident, more prominent in the right hemithorax than the left. Dominant left lung nodule is identified in the lingula and measures 2.3 cm (image 80/series 6). Multiple pulmonary masses are identified in the right lung including 3.2 cm right upper lobe lesion on image 50 and a 4.4 cm right middle lobe mass on image 85. Multiple right pleural nodules are identified in the posterior lower chest in there is a small right pleural effusion. Musculoskeletal: Bone windows reveal no worrisome lytic or sclerotic osseous lesions. CT ABDOMEN PELVIS FINDINGS Hepatobiliary: Scattered homogeneous, hypoattenuating lesions are identified in the liver measuring up to 15 mm. Larger lesions are most suggestive of cysts. The smaller lesions cannot be definitively characterized. Multiple calcified gallstones measure up to about 13 mm diameter. No intrahepatic or extrahepatic biliary dilation. Pancreas: No focal mass lesion. No dilatation of the main duct. No intraparenchymal cyst.  No peripancreatic edema. Spleen: No splenomegaly. No focal mass lesion. Adrenals/Urinary Tract: No adrenal nodule or mass. Kidneys unremarkable. No evidence for hydroureter. Mild circumferential wall thickening noted in the bladder. Subtle 10 mm soft tissue nodule is identified along the posterior left bladder wall, inferior to the left UVJ (see image 116/series 2 and coronal image 67/series 4. Stomach/Bowel: Tiny hiatal hernia. Stomach otherwise unremarkable. Duodenum is normally positioned as is the ligament of Treitz. No small bowel wall thickening. No small bowel dilatation. The terminal ileum is normal. The appendix is normal. Scattered diverticuli are seen in the right and left colon without diverticulitis. Vascular/Lymphatic: There is abdominal aortic atherosclerosis without aneurysm. There  is no gastrohepatic or hepatoduodenal ligament lymphadenopathy. No intraperitoneal or retroperitoneal lymphadenopathy. No pelvic sidewall lymphadenopathy. Reproductive: The prostate gland and seminal vesicles have normal imaging features. Other: No intraperitoneal free fluid. Musculoskeletal: Bone windows reveal no worrisome lytic or sclerotic osseous lesions. IMPRESSION: 1. Bilateral pulmonary nodules and masses, right greater than left are associated with right-sided pleural disease and small right pleural effusion. Metastatic disease in this patient with history of bladder cancer would be a distinct consideration although thoracic primary not excluded. 2. Lower paraesophageal lymphadenopathy 3. Mild circumferential bladder wall thickening with a 10 mm apparent bladder wall nodule posteriorly on the left. 4. No evidence for metastatic disease in the abdomen or pelvis. 5. Cholelithiasis. 6.  Aortic Atherosclerois (ICD10-170.0) Electronically Signed   By: Misty Stanley M.D.   On: 04/26/2018 15:29   Mr Brain Wo Contrast  Result Date: 05/19/2018 CLINICAL DATA:  Staging of metastatic urothelial carcinoma. Noncontrast  study requested due to renal dysfunction. EXAM: MRI HEAD WITHOUT CONTRAST TECHNIQUE: Multiplanar, multiecho pulse sequences of the brain and surrounding structures were obtained without intravenous contrast. COMPARISON:  None. FINDINGS: Brain: There is no evidence of acute infarct, intracranial hemorrhage, intracranial mass effect, or extra-axial fluid collection. Mild generalized cerebral atrophy is not greater than expected for age. Patchy subcortical and deep cerebral white matter T2 hyperintensities are nonspecific but compatible with mild-to-moderate chronic small vessel ischemic disease. Chronic lacunar infarcts are noted in the right paramedian pons and right putamen. No mass is identified, however sensitivity for detection of very small lesions is reduced by the lack of IV contrast. No vasogenic edema type signal is present. Vascular: Major intracranial vascular flow voids are preserved. Skull and upper cervical spine: Unremarkable bone marrow signal. Sinuses/Orbits: Unremarkable orbits. Paranasal sinuses and mastoid air cells are clear. Other: None. IMPRESSION: 1. No evidence of intracranial metastatic disease on this unenhanced examination. 2. Mild-to-moderate chronic small vessel ischemic disease. Electronically Signed   By: Logan Bores M.D.   On: 05/19/2018 09:05   Ct Abdomen Pelvis W Contrast  Result Date: 04/26/2018 CLINICAL DATA:  Bladder cancer with productive cough. EXAM: CT CHEST, ABDOMEN, AND PELVIS WITH CONTRAST TECHNIQUE: Multidetector CT imaging of the chest, abdomen and pelvis was performed following the standard protocol during bolus administration of intravenous contrast. CONTRAST:  38mL ISOVUE-300 IOPAMIDOL (ISOVUE-300) INJECTION 61% COMPARISON:  Chest x-ray from earlier today. FINDINGS: CT CHEST FINDINGS Cardiovascular: The heart size is normal. No pericardial effusion. Atherosclerotic calcification is noted in the wall of the thoracic aorta. Mediastinum/Nodes: Upper normal  mediastinal lymph nodes evident. No axillary lymphadenopathy. 13 mm short axis right hilar lymph node is identified. There is a 17 mm lymph node in the inferior right hilum (image 32/2). 2.4 cm short axis lower paraesophageal lymph node visible on image 47/series 2. Lungs/Pleura: The central tracheobronchial airways are patent. Multiple bilateral pulmonary nodules and masses are evident, more prominent in the right hemithorax than the left. Dominant left lung nodule is identified in the lingula and measures 2.3 cm (image 80/series 6). Multiple pulmonary masses are identified in the right lung including 3.2 cm right upper lobe lesion on image 50 and a 4.4 cm right middle lobe mass on image 85. Multiple right pleural nodules are identified in the posterior lower chest in there is a small right pleural effusion. Musculoskeletal: Bone windows reveal no worrisome lytic or sclerotic osseous lesions. CT ABDOMEN PELVIS FINDINGS Hepatobiliary: Scattered homogeneous, hypoattenuating lesions are identified in the liver measuring up to 15 mm. Larger lesions are most  suggestive of cysts. The smaller lesions cannot be definitively characterized. Multiple calcified gallstones measure up to about 13 mm diameter. No intrahepatic or extrahepatic biliary dilation. Pancreas: No focal mass lesion. No dilatation of the main duct. No intraparenchymal cyst. No peripancreatic edema. Spleen: No splenomegaly. No focal mass lesion. Adrenals/Urinary Tract: No adrenal nodule or mass. Kidneys unremarkable. No evidence for hydroureter. Mild circumferential wall thickening noted in the bladder. Subtle 10 mm soft tissue nodule is identified along the posterior left bladder wall, inferior to the left UVJ (see image 116/series 2 and coronal image 67/series 4. Stomach/Bowel: Tiny hiatal hernia. Stomach otherwise unremarkable. Duodenum is normally positioned as is the ligament of Treitz. No small bowel wall thickening. No small bowel dilatation. The  terminal ileum is normal. The appendix is normal. Scattered diverticuli are seen in the right and left colon without diverticulitis. Vascular/Lymphatic: There is abdominal aortic atherosclerosis without aneurysm. There is no gastrohepatic or hepatoduodenal ligament lymphadenopathy. No intraperitoneal or retroperitoneal lymphadenopathy. No pelvic sidewall lymphadenopathy. Reproductive: The prostate gland and seminal vesicles have normal imaging features. Other: No intraperitoneal free fluid. Musculoskeletal: Bone windows reveal no worrisome lytic or sclerotic osseous lesions. IMPRESSION: 1. Bilateral pulmonary nodules and masses, right greater than left are associated with right-sided pleural disease and small right pleural effusion. Metastatic disease in this patient with history of bladder cancer would be a distinct consideration although thoracic primary not excluded. 2. Lower paraesophageal lymphadenopathy 3. Mild circumferential bladder wall thickening with a 10 mm apparent bladder wall nodule posteriorly on the left. 4. No evidence for metastatic disease in the abdomen or pelvis. 5. Cholelithiasis. 6.  Aortic Atherosclerois (ICD10-170.0) Electronically Signed   By: Misty Stanley M.D.   On: 04/26/2018 15:29   Nm Pet Image Initial (pi) Skull Base To Thigh  Result Date: 05/10/2018 CLINICAL DATA:  Initial treatment strategy for metastatic disease of unknown primary. History of bladder cancer. EXAM: NUCLEAR MEDICINE PET SKULL BASE TO THIGH TECHNIQUE: 9.6 mCi F-18 FDG was injected intravenously. Full-ring PET imaging was performed from the skull base to thigh after the radiotracer. CT data was obtained and used for attenuation correction and anatomic localization. Fasting blood glucose: 96 mg/dl COMPARISON:  04/26/2018 chest abdomen and pelvic CTs. FINDINGS: Mediastinal blood pool activity: SUV max 2.7 NECK: No areas of abnormal hypermetabolism. Incidental CT findings: No cervical adenopathy. CHEST: Extensive  right-sided pleural metastasis with small right-sided pleural effusion. Index inferior right sided pleural implant measures on the order of 3.4 cm and a S.U.V. max of 12.9 on image 89/4. Thoracic nodal metastasis, with an index subcarinal node measuring 7 mm and a S.U.V. max of 3.9 on image 76/4. Bilateral pulmonary parenchymal metastasis. Right apical lung mass measures 2.9 cm and a S.U.V. max of 12.9 on image 58/4. A left upper lobe pulmonary nodule measures 1.3 cm and a S.U.V. max of 12.5 on image 64/4. Incidental CT findings: The right-sided pleural effusion is slightly increased. Other chest findings deferred to recent diagnostic CT. ABDOMEN/PELVIS: No areas of abnormal hypermetabolism. Incidental CT findings: Multiple gallstones. Normal adrenal glands. Abdominopelvic findings deferred to recent diagnostic CT. SKELETON: No abnormal marrow activity. Incidental CT findings: No focal osseous lesion. IMPRESSION: 1. Widespread metastatic disease within the chest, including bilateral pulmonary parenchymal, thoracic nodal, and right-sided pleural disease. 2. No evidence of extrathoracic metastatic disease. Electronically Signed   By: Abigail Miyamoto M.D.   On: 05/10/2018 14:41   Ct Biopsy  Result Date: 05/06/2018 INDICATION: History of bladder cancer, now with multiple pulmonary and  pleural nodules and masses worrisome for metastatic disease. Please perform CT-guided biopsy of dominant pleural mass for tissue diagnostic purposes. EXAM: CT GUIDED RIGHT PLEURAL MASS BIOPSY COMPARISON:  CT of the chest, abdomen and pelvis - 04/26/2018 MEDICATIONS: None. ANESTHESIA/SEDATION: Fentanyl 50 mcg IV; Versed 1 mg IV Sedation time: 14 minutes; The patient was continuously monitored during the procedure by the interventional radiology nurse under my direct supervision. CONTRAST:  None. COMPLICATIONS: None immediate. PROCEDURE: Informed consent was obtained from the patient following an explanation of the procedure, risks,  benefits and alternatives. A time out was performed prior to the initiation of the procedure. The patient was positioned prone on the CT table and a limited CT was performed for procedural planning demonstrating grossly unchanged size and appearance of the approximately 4.4 x 2.1 cm mass within the posterior basilar aspect of the right pleura (image 45, series 2). The procedure was planned. The operative site was prepped and draped in the usual sterile fashion. Appropriate trajectory was confirmed with a 22 gauge spinal needle after the adjacent tissues were anesthetized with 1% Lidocaine with epinephrine. Under intermittent CT guidance, a 17 gauge coaxial needle was advanced into the peripheral aspect of the mass. Appropriate positioning was confirmed and 5 core needle biopsy samples were obtained with an 18 gauge core needle biopsy device. The co-axial needle was removed and hemostasis was achieved with manual compression. A limited postprocedural CT was negative for pneumothorax, hemorrhage or additional complication. A dressing was placed. The patient tolerated the procedure well without immediate postprocedural complication. IMPRESSION: Technically successful CT guided core needle biopsy of right posterior basilar pleural mass. Electronically Signed   By: Sandi Mariscal M.D.   On: 05/06/2018 10:37    Labs:  CBC: Recent Labs    04/26/18 1232 04/26/18 1239 05/06/18 0729 05/19/18 1217 05/21/18 1047  WBC 7.3  --  6.2 7.2 13.4*  HGB 10.7* 12.2* 11.2* 10.1* 10.8*  HCT 36.4* 36.0* 34.2* 32.2* 34.1*  PLT 307  --  351 363 405*    COAGS: Recent Labs    05/06/18 0729 05/21/18 1047  INR 1.06 1.03  APTT 39* 32    BMP: Recent Labs    10/05/17 1436 01/28/18 0931 04/26/18 1239 04/26/18 1258 05/19/18 1217  NA 142 141 137 141 142  K 4.3 4.1 6.8* 4.0 3.9  CL 103 107 106 106 103  CO2 29 22  --  26 28  GLUCOSE 89 106* 96 97 110  BUN 17 13 23* 18 19  CALCIUM 9.3 8.9  --  8.8* 9.5  CREATININE  1.43* 1.57* 1.60* 1.71* 1.63*  GFRNONAA 48* 43*  --  39* 41*  GFRAA 56* 50*  --  45* 48*    LIVER FUNCTION TESTS: Recent Labs    05/19/18 1217  BILITOT 0.3  AST 17  ALT 11  ALKPHOS 75  PROT 7.7  ALBUMIN 3.2*    TUMOR MARKERS: No results for input(s): AFPTM, CEA, CA199, CHROMGRNA in the last 8760 hours.  Assessment and Plan: Metastatic urothelial cancer For port placement Labs ok. WBC slightly elevated. Pt just started Decadron loading course. Afebrile. Risks and benefits of image guided port-a-catheter placement was discussed with the patient including, but not limited to bleeding, infection, pneumothorax, or fibrin sheath development and need for additional procedures.  All of the patient's questions were answered, patient is agreeable to proceed. Consent signed and in chart.    Thank you for this interesting consult.  I greatly enjoyed meeting Jeremy Patrick  Pop and look forward to participating in their care.  A copy of this report was sent to the requesting provider on this date.  Electronically Signed: Ascencion Dike, PA-C 05/21/2018, 12:01 PM   I spent a total of 20 minutes in face to face in clinical consultation, greater than 50% of which was counseling/coordinating care for port placement

## 2018-05-21 NOTE — Discharge Instructions (Signed)
Implanted Port Insertion, Care After °This sheet gives you information about how to care for yourself after your procedure. Your health care provider may also give you more specific instructions. If you have problems or questions, contact your health care provider. °What can I expect after the procedure? °After your procedure, it is common to have: °· Discomfort at the port insertion site. °· Bruising on the skin over the port. This should improve over 3-4 days. ° °Follow these instructions at home: °Port care °· After your port is placed, you will get a manufacturer's information card. The card has information about your port. Keep this card with you at all times. °· Take care of the port as told by your health care provider. Ask your health care provider if you or a family member can get training for taking care of the port at home. A home health care nurse may also take care of the port. °· Make sure to remember what type of port you have. °Incision care °· Follow instructions from your health care provider about how to take care of your port insertion site. Make sure you: °? Wash your hands with soap and water before you change your bandage (dressing). If soap and water are not available, use hand sanitizer. °? Change your dressing as told by your health care provider. °? Leave stitches (sutures), skin glue, or adhesive strips in place. These skin closures may need to stay in place for 2 weeks or longer. If adhesive strip edges start to loosen and curl up, you may trim the loose edges. Do not remove adhesive strips completely unless your health care provider tells you to do that. °· Check your port insertion site every day for signs of infection. Check for: °? More redness, swelling, or pain. °? More fluid or blood. °? Warmth. °? Pus or a bad smell. °General instructions °· Do not take baths, swim, or use a hot tub until your health care provider approves. °· Do not lift anything that is heavier than 10 lb (4.5  kg) for a week, or as told by your health care provider. °· Ask your health care provider when it is okay to: °? Return to work or school. °? Resume usual physical activities or sports. °· Do not drive for 24 hours if you were given a medicine to help you relax (sedative). °· Take over-the-counter and prescription medicines only as told by your health care provider. °· Wear a medical alert bracelet in case of an emergency. This will tell any health care providers that you have a port. °· Keep all follow-up visits as told by your health care provider. This is important. °Contact a health care provider if: °· You cannot flush your port with saline as directed, or you cannot draw blood from the port. °· You have a fever or chills. °· You have more redness, swelling, or pain around your port insertion site. °· You have more fluid or blood coming from your port insertion site. °· Your port insertion site feels warm to the touch. °· You have pus or a bad smell coming from the port insertion site. °Get help right away if: °· You have chest pain or shortness of breath. °· You have bleeding from your port that you cannot control. °Summary °· Take care of the port as told by your health care provider. °· Change your dressing as told by your health care provider. °· Keep all follow-up visits as told by your health care provider. °  This information is not intended to replace advice given to you by your health care provider. Make sure you discuss any questions you have with your health care provider. °Document Released: 09/14/2013 Document Revised: 10/15/2016 Document Reviewed: 10/15/2016 °[employer] Interactive Patient Education © 2017 [employer] °Moderate Conscious Sedation, Adult, Care After °These instructions provide you with information about caring for yourself after your procedure. Your health care provider may also give you more specific instructions. Your treatment has been planned according to current medical  practices, but problems sometimes occur. Call your health care provider if you have any problems or questions after your procedure. °What can I expect after the procedure? °After your procedure, it is common: °· To feel sleepy for several hours. °· To feel clumsy and have poor balance for several hours. °· To have poor judgment for several hours. °· To vomit if you eat too soon. ° °Follow these instructions at home: °For at least 24 hours after the procedure: ° °· Do not: °? Participate in activities where you could fall or become injured. °? Drive. °? Use heavy machinery. °? Drink alcohol. °? Take sleeping pills or medicines that cause drowsiness. °? Make important decisions or sign legal documents. °? Take care of children on your own. °· Rest. °Eating and drinking °· Follow the diet recommended by your health care provider. °· If you vomit: °? Drink water, juice, or soup when you can drink without vomiting. °? Make sure you have little or no nausea before eating solid foods. °General instructions °· Have a responsible adult stay with you until you are awake and alert. °· Take over-the-counter and prescription medicines only as told by your health care provider. °· If you smoke, do not smoke without supervision. °· Keep all follow-up visits as told by your health care provider. This is important. °Contact a health care provider if: °· You keep feeling nauseous or you keep vomiting. °· You feel light-headed. °· You develop a rash. °· You have a fever. °Get help right away if: °· You have trouble breathing. °This information is not intended to replace advice given to you by your health care provider. Make sure you discuss any questions you have with your health care provider. °Document Released: 09/14/2013 Document Revised: 04/28/2016 Document Reviewed: 03/15/2016 °[employer] Interactive Patient Education © 2018 [employer] ° °

## 2018-05-24 ENCOUNTER — Telehealth: Payer: Self-pay | Admitting: Hematology

## 2018-05-24 NOTE — Telephone Encounter (Signed)
Appointments scheduled per 6/12 los/ patient notified and he will get new schedule at next visit

## 2018-05-25 NOTE — Progress Notes (Signed)
HEMATOLOGY/ONCOLOGY CONSULTATION NOTE  Date of Service: 05/26/2018  Patient Care Team: Martinique, Betty G, MD as PCP - General (Family Medicine)  Raynelle Bring, MD as Urologist  CHIEF COMPLAINTS/PURPOSE OF CONSULTATION:  F/u for newly diagnosed Metastatic Urothelial bladder carcinoma   HISTORY OF PRESENTING ILLNESS:   Jeremy Patrick is a wonderful 72 y.o. male who has been referred to [place] by Dr Betty Martinique for evaluation and management of metastatic malignancy of unknown primary. He is accompanied today by his wife. The pt reports that he is doing well overall.   The pt reports that he first noticed hematuria two years ago. He has had multiple bladder tumors removed since then, with the last resection in February 2019. He denies knowledge of metastasis and has been followed by urology and hasn't been seen by medical oncology previously.  He notes that his hematuria has resolved. His last treatment with intravesical Mitomycin C was 2-3 months ago, soon after his last tumor resection.   He presented to the ED on 04/26/18 because he was having intractable coughing. He notes that he would cough so much that he began to throw up. He denies coughing up any blood and has brought up some clear phlegm. He first noticed his cough one month ago, and it worsened to the point that he presented to the ED a couple days ago.  He does endorse feeling sleepier in the last month, as he wasn't eating as well- which he describes as a change in the last month. He notes that he has lost 20-30 pounds in the last 2-3 months after beginning Mytomycin C. He felt chills and flu-like symptoms for 2 weeks after beginning Mitomycin C.   Of note prior to the patient's visit today, pt has had CT C/A/P completed on 04/26/18 in the ED with results revealing 1. Bilateral pulmonary nodules and masses, right greater than left are associated with right-sided pleural disease and small right pleural effusion. Metastatic disease in this  patient with history of bladder cancer would be a distinct consideration although thoracic primary not excluded. 2. Lower paraesophageal lymphadenopathy 3. Mild circumferential bladder wall thickening with a 10 mm apparent bladder wall nodule posteriorly on the left. 4. No evidence for metastatic disease in the abdomen or pelvis. 5. Cholelithiasis. 6.  Aortic Atherosclerois.   Most recent lab results (04/26/18) of CBC  is as follows: all values are WNL except for Hgb at 10.7, HCT at 36.4, MCH at 24.3, MCHC at 29.4, Monocytes Abs at 1.3k.  On review of systems, pt reports decreased appetite, weight loss, increased fatigue, productive cough, mild back pains, and denies back aches, head aches, fevers, chills, night sweats, changes in vision, difficulty swallowing, and any other symptoms.   On PMHx the pt reports chronic kidney disease, bladder cancer, hypertension, GERD. On Social Hx the pt reports frequent exposure to clorox and he works in Development worker, international aid.    INTERVAL HISTORY   Jeremy Patrick is here for follow up regarding his metastatic urothelial carcinoma and a toxicity check at C1D8 of his treatment. The patient's last visit with [place] was on 05/19/18. He is accompanied today by his son and fiance. The pt reports that he is doing well overall.   The pt reports that he has tolerated C1 well so far. He notes that his appetite has improved as has his cough.   Lab results today (05/26/18) of CBC, CMP, and Reticulocytes is as follows: all values are WNL except for  HGB at 10.6, HCT at 32.7, MCV at 77.4, MCH at 25.0, RDW at 15.7, Creatinine at 1.60, Albumin at 3.2, GFR at 49. LDH 05/26/18 is WNL at 229 Phosphorous 05/26/18 is 2.8 Magnesium 05/26/18 is WNL at 2.4  On review of systems, pt reports improved cough, improved breathing and denies nausea, vomiting, diarrhea, fevers, chills, night sweats, blood in the urine, mouth sores, and any other symptoms.    MEDICAL HISTORY:  Past Medical  History:  Diagnosis Date  . Cancer [facility])    bladder cancer  . GERD (gastroesophageal reflux disease)   . Hypertension     SURGICAL HISTORY: Past Surgical History:  Procedure Laterality Date  . BLADDER SURGERY    . CYSTOSCOPY N/A 04/06/2017   Procedure: CYSTOSCOPY;  Surgeon: Raynelle Bring, MD;  Location: [facility];  Service: Urology;  Laterality: N/A;  . CYSTOSCOPY W/ RETROGRADES N/A 01/28/2018   Procedure: CYSTOSCOPY WITH RETROGRADE PYELOGRAM;  Surgeon: Raynelle Bring, MD;  Location: [facility];  Service: Urology;  Laterality: N/A;  . CYSTOSCOPY W/ URETERAL STENT PLACEMENT N/A 10/08/2017   Procedure: CYSTOSCOPY WITH BILATERAL  RETROGRADE PYELOGRAM/ LEFT URETERAL STENT PLACEMENT;  Surgeon: Raynelle Bring, MD;  Location: [facility];  Service: Urology;  Laterality: N/A;  . IR IMAGING GUIDED PORT INSERTION  05/21/2018  . TRANSURETHRAL RESECTION OF BLADDER TUMOR N/A 04/06/2017   Procedure: TRANSURETHRAL RESECTION OF BLADDER TUMOR (TURBT);  Surgeon: Raynelle Bring, MD;  Location: [facility];  Service: Urology;  Laterality: N/A;  . TRANSURETHRAL RESECTION OF BLADDER TUMOR N/A 10/08/2017   Procedure: TRANSURETHRAL RESECTION OF BLADDER TUMOR (TURBT);  Surgeon: Raynelle Bring, MD;  Location: [facility];  Service: Urology;  Laterality: N/A;  . TRANSURETHRAL RESECTION OF BLADDER TUMOR N/A 01/28/2018   Procedure: TRANSURETHRAL RESECTION OF BLADDER TUMOR (TURBT);  Surgeon: Raynelle Bring, MD;  Location: [facility];  Service: Urology;  Laterality: N/A;  GENERAL ANESTHESIA WITH PARALYSIS  . TRANSURETHRAL RESECTION OF BLADDER TUMOR WITH MITOMYCIN-C N/A 03/02/2017   Procedure: TRANSURETHRAL RESECTION OF BLADDER TUMOR/ EXAM UNDER ANESTHESIA/ WITH MITOMYCIN-C POST OPERATIVE;  Surgeon: Raynelle Bring, MD;  Location: [facility];  Service: Urology;  Laterality: N/A;    SOCIAL HISTORY: Social History   Socioeconomic History  . Marital status: Married    Spouse name: Not on file  . Number of children: 4  . Years of education: 10  . Highest  education level: Not on file  Occupational History  . Occupation: Retired  Scientific laboratory technician  . Financial resource strain: Not on file  . Food insecurity:    Worry: Not on file    Inability: Not on file  . Transportation needs:    Medical: Not on file    Non-medical: Not on file  Tobacco Use  . Smoking status: Former Smoker    Packs/day: 0.15    Years: 10.00    Pack years: 1.50    Types: Cigarettes    Last attempt to quit: 1980    Years since quitting: 39.4  . Smokeless tobacco: Never Used  Substance and Sexual Activity  . Alcohol use: Not Currently  . Drug use: No    Comment: "years ago"  . Sexual activity: Not on file  Lifestyle  . Physical activity:    Days per week: Not on file    Minutes per session: Not on file  . Stress: Not on file  Relationships  . Social connections:    Talks on phone: Not on file    Gets together: Not on file  Attends religious service: Not on file    Active member of club or organization: Not on file    Attends meetings of clubs or organizations: Not on file    Relationship status: Not on file  . Intimate partner violence:    Fear of current or ex partner: Not on file    Emotionally abused: Not on file    Physically abused: Not on file    Forced sexual activity: Not on file  Other Topics Concern  . Not on file  Social History Narrative   Fun/Hobby: Go to chur[employer] and travel.     FAMILY HISTORY: Family History  Problem Relation Age of Onset  . Hypertension Mother   . Stroke Mother   . Diabetes Mother   . Cataracts Mother   . Kidney disease Mother   . Hypertension Father   . Stroke Father   . Aneurysm Maternal Grandmother     ALLERGIES:  has No Known Allergies.  MEDICATIONS:  Current Outpatient Medications  Medication Sig Dispense Refill  . benzonatate (TESSALON) 100 MG capsule Take 1 capsule (100 mg total) by mouth 3 (three) times daily as needed for cough. 30 capsule 0  . Cyanocobalamin (VITAMIN B 12 PO) Take 1 tablet by mouth  daily.    Marland Kitchen dexamethasone (DECADRON) 4 MG tablet Take 2 tablets (8 mg total) by mouth daily. Start the day after carboplatin chemotherapy for 2 days. 30 tablet 1  . diltiazem (CARDIZEM CD) 360 MG 24 hr capsule Take 1 capsule (360 mg total) by mouth daily. Must keep scheduled appt w/new provider for future refills 90 capsule 0  . dronabinol (MARINOL) 5 MG capsule Take 1 capsule (5 mg total) by mouth 2 (two) times daily before a meal. 60 capsule 0  . Ferrous Gluconate (IRON 27 PO) Take 27 mg by mouth daily.    . Glucos-Chondroit-Hyaluron-MSM (GLUCOSAMINE CHONDROITIN JOINT PO) Take 1,500 mg by mouth daily.    Marland Kitchen guaiFENesin-dextromethorphan (ROBITUSSIN DM) 100-10 MG/5ML syrup Take 5 mLs by mouth 3 (three) times daily as needed for cough. (Patient not taking: Reported on 05/06/2018) 118 mL 0  . lidocaine-prilocaine (EMLA) cream Apply to affected area once 30 g 3  . LORazepam (ATIVAN) 0.5 MG tablet Take 1 tablet (0.5 mg total) by mouth every 6 (six) hours as needed (Nausea or vomiting). 30 tablet 0  . ondansetron (ZOFRAN) 8 MG tablet Take 1 tablet (8 mg total) by mouth 2 (two) times daily as needed for refractory nausea / vomiting. Start on day 3 after carboplatin chemo. 30 tablet 1  . ondansetron (ZOFRAN-ODT) 4 MG disintegrating tablet Take 1 tablet (4 mg total) by mouth every 8 (eight) hours as needed for nausea or vomiting. 30 tablet 0  . prochlorperazine (COMPAZINE) 10 MG tablet Take 1 tablet (10 mg total) by mouth every 6 (six) hours as needed (Nausea or vomiting). 30 tablet 1  . ranitidine (ZANTAC) 300 MG tablet Take 1 tablet (300 mg total) by mouth every morning. Must keep appt for future refills 90 tablet 0  . sildenafil (VIAGRA) 50 MG tablet Take 1 tablet (50 mg total) by mouth daily as needed for erectile dysfunction. 20 tablet 3   No current facility-administered medications for this visit.     REVIEW OF SYSTEMS:  A 10+ POINT REVIEW OF SYSTEMS WAS OBTAINED including neurology, dermatology,  psychiatry, cardiac, respiratory, lymph, extremities, GI, GU, Musculoskeletal, constitutional, breasts, reproductive, HEENT.  All pertinent positives are noted in the HPI.  All others are negative.  PHYSICAL EXAMINATION: ECOG PERFORMANCE STATUS: 1 - Symptomatic but completely ambulatory  Vitals:   05/26/18 0919  BP: 134/87  Pulse: 82  Resp: 18  Temp: 99.2 F (37.3 C)  SpO2: 98%   Filed Weights   05/26/18 0919  Weight: 186 lb 14.4 oz (84.8 kg)   .Body mass index is 25.35 kg/m.  GENERAL:alert, in no acute distress and comfortable SKIN: no acute rashes, no significant lesions EYES: conjunctiva are pink and non-injected, sclera anicteric OROPHARYNX: MMM, no exudates, no oropharyngeal erythema or ulceration NECK: supple, no JVD LYMPH:  no palpable lymphadenopathy in the cervical, axillary or inguinal regions LUNGS: clear to auscultation b/l with normal respiratory effort HEART: regular rate & rhythm ABDOMEN:  normoactive bowel sounds , non tender, not distended. Extremity: no pedal edema PSYCH: alert & oriented x 3 with fluent speech NEURO: no focal motor/sensory deficits   LABORATORY DATA:  I have reviewed the data as listed  . CBC Latest Ref Rng & Units 05/26/2018 05/21/2018 05/19/2018  WBC 4.0 - 10.3 K/uL 5.3 13.4(H) 7.2  Hemoglobin 13.0 - 17.1 g/dL 10.6(L) 10.8(L) 10.1(L)  Hematocrit 38.4 - 49.9 % 32.7(L) 34.1(L) 32.2(L)  Platelets 140 - 400 K/uL 183 405(H) 363    . CMP Latest Ref Rng & Units 05/29/2018 05/26/2018 05/19/2018  Glucose 65 - 99 mg/dL 114(H) 117 110  BUN 6 - 20 mg/dL 25(H) 22 19  Creatinine 0.61 - 1.24 mg/dL 1.75(H) 1.60(H) 1.63(H)  Sodium 135 - 145 mmol/L 138 137 142  Potassium 3.5 - 5.1 mmol/L 4.4 4.1 3.9  Chloride 101 - 111 mmol/L 102 100 103  CO2 22 - 32 mmol/L 27 28 28   Calcium 8.9 - 10.3 mg/dL 8.2(L) 9.4 9.5  Total Protein 6.4 - 8.3 g/dL - 7.5 7.7  Total Bilirubin 0.2 - 1.2 mg/dL - 0.5 0.3  Alkaline Phos 40 - 150 U/L - 80 75  AST 5 - 34 U/L - 27  17  ALT 0 - 55 U/L - 33 11    PATHOLOGY  DIAGNOSIS:  05/06/18 A. PLEURAL MASS, RIGHT; CT-GUIDED BIOPSY:  - METASTATIC HIGH-GRADE UROTHELIAL CARCINOMA. Comment:  The patient has a history of invasive high-grade urothelial carcinoma with glandular (10%) and small cell differentiation (no more than 5%) diagnosed in a TURBT specimen of the left bladder neck from March 2018 at an outside hospital.  Given the patient's history a limited panel of immunohistochemical  stains was performed with the following pattern of immunoreactivity:  GATA-3: Positive  CD56: Negative  TTF-1: Negative  Napsin: Negative  The immunohistochemical and morphologic findings are consistent with metastatic high-grade urothelial carcinoma. The slides from the outside  prior TURBT ([id number]) were requested and reviewed in conjunction with this case. While a glandular and small cell component was identified in the 2018 specimen those morphologic patterns are not identified in the current sample.  These findings were communicated to Dr. Irene Limbo via [facility] in basket on 05/10/2018. IHC slides were prepared by [facility], [place], [place]. All controls stained appropriately. This test was developed and its performance characteristics determined  by [facility]. It has not been cleared or approved by the [place] Food and Drug  Administration. The FDA does not require this test to go through  premarket FDA review. This test is used for clinical purposes. It should  not be regarded as investigational or for research. This laboratory is  certified under the Clinical Laboratory Improvement Amendments (CLIA) as  qualified to perform high complexity clinical  laboratory testing.    01/28/18 Bx:    03/02/17 Bx:     RADIOGRAPHIC STUDIES: I have personally reviewed the radiological images as listed and agreed with the findings in the report. Dg Chest 2 View  Result Date: 04/26/2018 CLINICAL DATA:  Cough, shortness of  breath EXAM: CHEST - 2 VIEW COMPARISON:  02/27/2017 FINDINGS: Multiple bilateral pulmonary masses and pulmonary nodules with the largest in the right middle lobe measuring 5 cm. Small right pleural effusion. No pneumothorax. Stable cardiomediastinal silhouette. IMPRESSION: Multiple bilateral pulmonary masses and pulmonary nodules most concerning for metastatic disease. Electronically Signed   By: Kathreen Devoid   On: 04/26/2018 14:09   Ct Chest W Contrast  Result Date: 04/26/2018 CLINICAL DATA:  Bladder cancer with productive cough. EXAM: CT CHEST, ABDOMEN, AND PELVIS WITH CONTRAST TECHNIQUE: Multidetector CT imaging of the chest, abdomen and pelvis was performed following the standard protocol during bolus administration of intravenous contrast. CONTRAST:  78mL ISOVUE-300 IOPAMIDOL (ISOVUE-300) INJECTION 61% COMPARISON:  Chest x-ray from earlier today. FINDINGS: CT CHEST FINDINGS Cardiovascular: The heart size is normal. No pericardial effusion. Atherosclerotic calcification is noted in the wall of the thoracic aorta. Mediastinum/Nodes: Upper normal mediastinal lymph nodes evident. No axillary lymphadenopathy. 13 mm short axis right hilar lymph node is identified. There is a 17 mm lymph node in the inferior right hilum (image 32/2). 2.4 cm short axis lower paraesophageal lymph node visible on image 47/series 2. Lungs/Pleura: The central tracheobronchial airways are patent. Multiple bilateral pulmonary nodules and masses are evident, more prominent in the right hemithorax than the left. Dominant left lung nodule is identified in the lingula and measures 2.3 cm (image 80/series 6). Multiple pulmonary masses are identified in the right lung including 3.2 cm right upper lobe lesion on image 50 and a 4.4 cm right middle lobe mass on image 85. Multiple right pleural nodules are identified in the posterior lower chest in there is a small right pleural effusion. Musculoskeletal: Bone windows reveal no worrisome lytic or  sclerotic osseous lesions. CT ABDOMEN PELVIS FINDINGS Hepatobiliary: Scattered homogeneous, hypoattenuating lesions are identified in the liver measuring up to 15 mm. Larger lesions are most suggestive of cysts. The smaller lesions cannot be definitively characterized. Multiple calcified gallstones measure up to about 13 mm diameter. No intrahepatic or extrahepatic biliary dilation. Pancreas: No focal mass lesion. No dilatation of the main duct. No intraparenchymal cyst. No peripancreatic edema. Spleen: No splenomegaly. No focal mass lesion. Adrenals/Urinary Tract: No adrenal nodule or mass. Kidneys unremarkable. No evidence for hydroureter. Mild circumferential wall thickening noted in the bladder. Subtle 10 mm soft tissue nodule is identified along the posterior left bladder wall, inferior to the left UVJ (see image 116/series 2 and coronal image 67/series 4. Stomach/Bowel: Tiny hiatal hernia. Stomach otherwise unremarkable. Duodenum is normally positioned as is the ligament of Treitz. No small bowel wall thickening. No small bowel dilatation. The terminal ileum is normal. The appendix is normal. Scattered diverticuli are seen in the right and left colon without diverticulitis. Vascular/Lymphatic: There is abdominal aortic atherosclerosis without aneurysm. There is no gastrohepatic or hepatoduodenal ligament lymphadenopathy. No intraperitoneal or retroperitoneal lymphadenopathy. No pelvic sidewall lymphadenopathy. Reproductive: The prostate gland and seminal vesicles have normal imaging features. Other: No intraperitoneal free fluid. Musculoskeletal: Bone windows reveal no worrisome lytic or sclerotic osseous lesions. IMPRESSION: 1. Bilateral pulmonary nodules and masses, right greater than left are associated with right-sided pleural disease and small right pleural effusion. Metastatic disease in this patient with history of bladder  cancer would be a distinct consideration although thoracic primary not excluded.  2. Lower paraesophageal lymphadenopathy 3. Mild circumferential bladder wall thickening with a 10 mm apparent bladder wall nodule posteriorly on the left. 4. No evidence for metastatic disease in the abdomen or pelvis. 5. Cholelithiasis. 6.  Aortic Atherosclerois (ICD10-170.0) Electronically Signed   By: Misty Stanley M.D.   On: 04/26/2018 15:29   Mr Brain Wo Contrast  Result Date: 05/19/2018 CLINICAL DATA:  Staging of metastatic urothelial carcinoma. Noncontrast study requested due to renal dysfunction. EXAM: MRI HEAD WITHOUT CONTRAST TECHNIQUE: Multiplanar, multiecho pulse sequences of the brain and surrounding structures were obtained without intravenous contrast. COMPARISON:  None. FINDINGS: Brain: There is no evidence of acute infarct, intracranial hemorrhage, intracranial mass effect, or extra-axial fluid collection. Mild generalized cerebral atrophy is not greater than expected for age. Patchy subcortical and deep cerebral white matter T2 hyperintensities are nonspecific but compatible with mild-to-moderate chronic small vessel ischemic disease. Chronic lacunar infarcts are noted in the right paramedian pons and right putamen. No mass is identified, however sensitivity for detection of very small lesions is reduced by the lack of IV contrast. No vasogenic edema type signal is present. Vascular: Major intracranial vascular flow voids are preserved. Skull and upper cervical spine: Unremarkable bone marrow signal. Sinuses/Orbits: Unremarkable orbits. Paranasal sinuses and mastoid air cells are clear. Other: None. IMPRESSION: 1. No evidence of intracranial metastatic disease on this unenhanced examination. 2. Mild-to-moderate chronic small vessel ischemic disease. Electronically Signed   By: Logan Bores M.D.   On: 05/19/2018 09:05   Ct Abdomen Pelvis W Contrast  Result Date: 04/26/2018 CLINICAL DATA:  Bladder cancer with productive cough. EXAM: CT CHEST, ABDOMEN, AND PELVIS WITH CONTRAST TECHNIQUE:  Multidetector CT imaging of the chest, abdomen and pelvis was performed following the standard protocol during bolus administration of intravenous contrast. CONTRAST:  19mL ISOVUE-300 IOPAMIDOL (ISOVUE-300) INJECTION 61% COMPARISON:  Chest x-ray from earlier today. FINDINGS: CT CHEST FINDINGS Cardiovascular: The heart size is normal. No pericardial effusion. Atherosclerotic calcification is noted in the wall of the thoracic aorta. Mediastinum/Nodes: Upper normal mediastinal lymph nodes evident. No axillary lymphadenopathy. 13 mm short axis right hilar lymph node is identified. There is a 17 mm lymph node in the inferior right hilum (image 32/2). 2.4 cm short axis lower paraesophageal lymph node visible on image 47/series 2. Lungs/Pleura: The central tracheobronchial airways are patent. Multiple bilateral pulmonary nodules and masses are evident, more prominent in the right hemithorax than the left. Dominant left lung nodule is identified in the lingula and measures 2.3 cm (image 80/series 6). Multiple pulmonary masses are identified in the right lung including 3.2 cm right upper lobe lesion on image 50 and a 4.4 cm right middle lobe mass on image 85. Multiple right pleural nodules are identified in the posterior lower chest in there is a small right pleural effusion. Musculoskeletal: Bone windows reveal no worrisome lytic or sclerotic osseous lesions. CT ABDOMEN PELVIS FINDINGS Hepatobiliary: Scattered homogeneous, hypoattenuating lesions are identified in the liver measuring up to 15 mm. Larger lesions are most suggestive of cysts. The smaller lesions cannot be definitively characterized. Multiple calcified gallstones measure up to about 13 mm diameter. No intrahepatic or extrahepatic biliary dilation. Pancreas: No focal mass lesion. No dilatation of the main duct. No intraparenchymal cyst. No peripancreatic edema. Spleen: No splenomegaly. No focal mass lesion. Adrenals/Urinary Tract: No adrenal nodule or mass.  Kidneys unremarkable. No evidence for hydroureter. Mild circumferential wall thickening noted in the bladder. Subtle 10  mm soft tissue nodule is identified along the posterior left bladder wall, inferior to the left UVJ (see image 116/series 2 and coronal image 67/series 4. Stomach/Bowel: Tiny hiatal hernia. Stomach otherwise unremarkable. Duodenum is normally positioned as is the ligament of Treitz. No small bowel wall thickening. No small bowel dilatation. The terminal ileum is normal. The appendix is normal. Scattered diverticuli are seen in the right and left colon without diverticulitis. Vascular/Lymphatic: There is abdominal aortic atherosclerosis without aneurysm. There is no gastrohepatic or hepatoduodenal ligament lymphadenopathy. No intraperitoneal or retroperitoneal lymphadenopathy. No pelvic sidewall lymphadenopathy. Reproductive: The prostate gland and seminal vesicles have normal imaging features. Other: No intraperitoneal free fluid. Musculoskeletal: Bone windows reveal no worrisome lytic or sclerotic osseous lesions. IMPRESSION: 1. Bilateral pulmonary nodules and masses, right greater than left are associated with right-sided pleural disease and small right pleural effusion. Metastatic disease in this patient with history of bladder cancer would be a distinct consideration although thoracic primary not excluded. 2. Lower paraesophageal lymphadenopathy 3. Mild circumferential bladder wall thickening with a 10 mm apparent bladder wall nodule posteriorly on the left. 4. No evidence for metastatic disease in the abdomen or pelvis. 5. Cholelithiasis. 6.  Aortic Atherosclerois (ICD10-170.0) Electronically Signed   By: Misty Stanley M.D.   On: 04/26/2018 15:29   Nm Pet Image Initial (pi) Skull Base To Thigh  Result Date: 05/10/2018 CLINICAL DATA:  Initial treatment strategy for metastatic disease of unknown primary. History of bladder cancer. EXAM: NUCLEAR MEDICINE PET SKULL BASE TO THIGH TECHNIQUE: 9.6  mCi F-18 FDG was injected intravenously. Full-ring PET imaging was performed from the skull base to thigh after the radiotracer. CT data was obtained and used for attenuation correction and anatomic localization. Fasting blood glucose: 96 mg/dl COMPARISON:  04/26/2018 chest abdomen and pelvic CTs. FINDINGS: Mediastinal blood pool activity: SUV max 2.7 NECK: No areas of abnormal hypermetabolism. Incidental CT findings: No cervical adenopathy. CHEST: Extensive right-sided pleural metastasis with small right-sided pleural effusion. Index inferior right sided pleural implant measures on the order of 3.4 cm and a S.U.V. max of 12.9 on image 89/4. Thoracic nodal metastasis, with an index subcarinal node measuring 7 mm and a S.U.V. max of 3.9 on image 76/4. Bilateral pulmonary parenchymal metastasis. Right apical lung mass measures 2.9 cm and a S.U.V. max of 12.9 on image 58/4. A left upper lobe pulmonary nodule measures 1.3 cm and a S.U.V. max of 12.5 on image 64/4. Incidental CT findings: The right-sided pleural effusion is slightly increased. Other chest findings deferred to recent diagnostic CT. ABDOMEN/PELVIS: No areas of abnormal hypermetabolism. Incidental CT findings: Multiple gallstones. Normal adrenal glands. Abdominopelvic findings deferred to recent diagnostic CT. SKELETON: No abnormal marrow activity. Incidental CT findings: No focal osseous lesion. IMPRESSION: 1. Widespread metastatic disease within the chest, including bilateral pulmonary parenchymal, thoracic nodal, and right-sided pleural disease. 2. No evidence of extrathoracic metastatic disease. Electronically Signed   By: Abigail Miyamoto M.D.   On: 05/10/2018 14:41   Ct Biopsy  Result Date: 05/06/2018 INDICATION: History of bladder cancer, now with multiple pulmonary and pleural nodules and masses worrisome for metastatic disease. Please perform CT-guided biopsy of dominant pleural mass for tissue diagnostic purposes. EXAM: CT GUIDED RIGHT PLEURAL  MASS BIOPSY COMPARISON:  CT of the chest, abdomen and pelvis - 04/26/2018 MEDICATIONS: None. ANESTHESIA/SEDATION: Fentanyl 50 mcg IV; Versed 1 mg IV Sedation time: 14 minutes; The patient was continuously monitored during the procedure by the interventional radiology nurse under my direct supervision. CONTRAST:  None.  COMPLICATIONS: None immediate. PROCEDURE: Informed consent was obtained from the patient following an explanation of the procedure, risks, benefits and alternatives. A time out was performed prior to the initiation of the procedure. The patient was positioned prone on the CT table and a limited CT was performed for procedural planning demonstrating grossly unchanged size and appearance of the approximately 4.4 x 2.1 cm mass within the posterior basilar aspect of the right pleura (image 45, series 2). The procedure was planned. The operative site was prepped and draped in the usual sterile fashion. Appropriate trajectory was confirmed with a 22 gauge spinal needle after the adjacent tissues were anesthetized with 1% Lidocaine with epinephrine. Under intermittent CT guidance, a 17 gauge coaxial needle was advanced into the peripheral aspect of the mass. Appropriate positioning was confirmed and 5 core needle biopsy samples were obtained with an 18 gauge core needle biopsy device. The co-axial needle was removed and hemostasis was achieved with manual compression. A limited postprocedural CT was negative for pneumothorax, hemorrhage or additional complication. A dressing was placed. The patient tolerated the procedure well without immediate postprocedural complication. IMPRESSION: Technically successful CT guided core needle biopsy of right posterior basilar pleural mass. Electronically Signed   By: Sandi Mariscal M.D.   On: 05/06/2018 10:37   Ir Imaging Guided Port Insertion  Result Date: 05/21/2018 CLINICAL DATA:  Metastatic urothelial carcinoma EXAM: RIGHT INTERNAL JUGULAR SINGLE LUMEN POWER PORT  CATHETER INSERTION Date:  05/21/2018 05/21/2018 2:20 pm Radiologist:  M. Daryll Brod, MD Guidance:  Ultrasound and fluoroscopic MEDICATIONS: Ancef 2 g; The antibiotic was administered within an appropriate time interval prior to skin puncture. ANESTHESIA/SEDATION: Versed 2.0 mg IV; Fentanyl 100 mcg IV; Moderate Sedation Time:  36 minutes The patient was continuously monitored during the procedure by the interventional radiology nurse under my direct supervision. FLUOROSCOPY TIME:  One minutes, 36 seconds (19 mGy) COMPLICATIONS: None immediate. CONTRAST:  None. PROCEDURE: Informed consent was obtained from the patient following explanation of the procedure, risks, benefits and alternatives. The patient understands, agrees and consents for the procedure. All questions were addressed. A time out was performed. Maximal barrier sterile technique utilized including caps, mask, sterile gowns, sterile gloves, large sterile drape, hand hygiene, and 2% chlorhexidine scrub. Under sterile conditions and local anesthesia, right internal jugular micropuncture venous access was performed. Access was performed with ultrasound. Images were obtained for documentation of the patent right internal jugular vein. A guide wire was inserted followed by a transitional dilator. This allowed insertion of a guide wire and catheter into the IVC. Measurements were obtained from the SVC / RA junction back to the right IJ venotomy site. In the right infraclavicular chest, a subcutaneous pocket was created over the second anterior rib. This was done under sterile conditions and local anesthesia. 1% lidocaine with epinephrine was utilized for this. A 2.5 cm incision was made in the skin. Blunt dissection was performed to create a subcutaneous pocket over the right pectoralis major muscle. The pocket was flushed with saline vigorously. There was adequate hemostasis. The port catheter was assembled and checked for leakage. The port catheter was secured  in the pocket with two retention sutures. The tubing was tunneled subcutaneously to the right venotomy site and inserted into the SVC/RA junction through a valved peel-away sheath. Position was confirmed with fluoroscopy. Images were obtained for documentation. The patient tolerated the procedure well. No immediate complications. Incisions were closed in a two layer fashion with 4 - 0 Vicryl suture. Dermabond was applied to  the skin. The port catheter was accessed, blood was aspirated followed by saline and heparin flushes. Needle was removed. A dry sterile dressing was applied. IMPRESSION: Ultrasound and fluoroscopically guided right internal jugular single lumen power port catheter insertion. Tip in the SVC/RA junction. Catheter ready for use. Electronically Signed   By: Jerilynn Mages.  Shick M.D.   On: 05/21/2018 17:01    ASSESSMENT & PLAN:  72 y.o. male with  1. 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   -04/26/18 CT C/A/P which revealed Bilateral pulmonary nodules and masses, right greater than left are associated with right-sided pleural disease and small right pleural effusion.  -CXR 02/27/17 did not reveal any lung masses -Pathology from March 2018 showed a small cell component and invasion into the urinary bladder muscle -PET from 05/10/18 which shows wide spread metastatic disease in the chest, including bilateral pulmonary parenchymal, thoracic nodal, and right-sided pleural disease. No evidence of extrathoracic metastatic disease. -Biopsy from 05/06/18 which shows evidence of metastatic high grade urothelial carcinoma. He has recurrent now high grade metastatic disease to the lung.  MRI brain: No evidence of  intracranial metastatic disease  2. Intractable cough from pulmonary metastases -On tessalon perles with some relief  PLAN -Discussed pt labwork today, 05/26/18; anemia with HGB at 10.6, PLT normal. Stable CKD and electrolytes.  -no prohibitive toxicity from the carboplatin/Gemcitabine at this time. -Will proceed with C1 D8 treatment and will also proceed with Neulasta tomorrow -If C1 tolerance continues, and counts are stable will increase Gemcitabine dose from 800mg /m2 to 1000mg /m2 for C2 -Recommend drinking at least 48-64 oz of water each day -Recommended crowd avoidance and using a mask -Will see pt back in 2 weeks for C2 -Recommended walking at least 20-30 minutes each day and continuing to eat 3 meals a day    C1D8 today C1D9 - udenycha tomorrow Please schedule C2 and C3 as per orders with labs on D1 and D8 each cycle RTC with Dr Irene Limbo on D1 of each cycle with labs    All of the patients questions were answered with apparent satisfaction. The patient knows to call the clinic with any problems, questions or concerns.  . The total time spent in the appointment was 25 minutes and more than 50% was on counseling and direct patient cares.     Sullivan Lone MD MS AAHIVMS [facility] Hematology/Oncology Physician [facility]  (Office):       [phone] (Work cell):  [phone] (Fax):           [phone]  05/26/2018 10:06 AM  I, Baldwin Jamaica, am acting as a Education administrator for Dr Irene Limbo.   .I have reviewed the above documentation for accuracy and completeness, and I agree with the above. Brunetta Genera MD

## 2018-05-25 NOTE — Progress Notes (Signed)
FMLA successfully faxed to Angelina Pih at [phone]. Mailed copy to patient address on file.

## 2018-05-26 ENCOUNTER — Ambulatory Visit: Payer: Medicare Other

## 2018-05-26 ENCOUNTER — Inpatient Hospital Stay: Payer: Medicare Other

## 2018-05-26 ENCOUNTER — Telehealth: Payer: Self-pay

## 2018-05-26 ENCOUNTER — Inpatient Hospital Stay (HOSPITAL_BASED_OUTPATIENT_CLINIC_OR_DEPARTMENT_OTHER): Payer: Medicare Other | Admitting: Hematology

## 2018-05-26 ENCOUNTER — Encounter: Payer: Self-pay | Admitting: Hematology

## 2018-05-26 VITALS — BP 134/87 | HR 82 | Temp 99.2°F | Resp 18 | Ht 72.0 in | Wt 186.9 lb

## 2018-05-26 DIAGNOSIS — R05 Cough: Secondary | ICD-10-CM

## 2018-05-26 DIAGNOSIS — C7800 Secondary malignant neoplasm of unspecified lung: Secondary | ICD-10-CM

## 2018-05-26 DIAGNOSIS — C679 Malignant neoplasm of bladder, unspecified: Secondary | ICD-10-CM

## 2018-05-26 DIAGNOSIS — C799 Secondary malignant neoplasm of unspecified site: Secondary | ICD-10-CM

## 2018-05-26 DIAGNOSIS — C801 Malignant (primary) neoplasm, unspecified: Principal | ICD-10-CM

## 2018-05-26 DIAGNOSIS — R059 Cough, unspecified: Secondary | ICD-10-CM

## 2018-05-26 DIAGNOSIS — C7910 Secondary malignant neoplasm of unspecified urinary organs: Secondary | ICD-10-CM

## 2018-05-26 DIAGNOSIS — Z5189 Encounter for other specified aftercare: Secondary | ICD-10-CM | POA: Diagnosis not present

## 2018-05-26 DIAGNOSIS — Z7189 Other specified counseling: Secondary | ICD-10-CM

## 2018-05-26 DIAGNOSIS — Z5111 Encounter for antineoplastic chemotherapy: Secondary | ICD-10-CM | POA: Diagnosis not present

## 2018-05-26 DIAGNOSIS — C678 Malignant neoplasm of overlapping sites of bladder: Secondary | ICD-10-CM

## 2018-05-26 LAB — CMP (CANCER CENTER ONLY)
ALT: 33 U/L (ref 0–55)
AST: 27 U/L (ref 5–34)
Albumin: 3.2 g/dL — ABNORMAL LOW (ref 3.5–5.0)
Alkaline Phosphatase: 80 U/L (ref 40–150)
Anion gap: 9 (ref 3–11)
BUN: 22 mg/dL (ref 7–26)
CO2: 28 mmol/L (ref 22–29)
Calcium: 9.4 mg/dL (ref 8.4–10.4)
Chloride: 100 mmol/L (ref 98–109)
Creatinine: 1.6 mg/dL — ABNORMAL HIGH (ref 0.70–1.30)
GFR, Est AFR Am: 49 mL/min — ABNORMAL LOW (ref >60)
GFR, Estimated: 42 mL/min — ABNORMAL LOW (ref >60)
Glucose, Bld: 117 mg/dL (ref 70–140)
Potassium: 4.1 mmol/L (ref 3.5–5.1)
Sodium: 137 mmol/L (ref 136–145)
Total Bilirubin: 0.5 mg/dL (ref 0.2–1.2)
Total Protein: 7.5 g/dL (ref 6.4–8.3)

## 2018-05-26 LAB — LACTATE DEHYDROGENASE: LDH: 229 U/L (ref 125–245)

## 2018-05-26 LAB — CBC WITH DIFFERENTIAL/PLATELET
Basophils Absolute: 0 10*3/uL (ref 0.0–0.1)
Basophils Relative: 1 %
Eosinophils Absolute: 0.1 10*3/uL (ref 0.0–0.5)
Eosinophils Relative: 1 %
HCT: 32.7 % — ABNORMAL LOW (ref 38.4–49.9)
Hemoglobin: 10.6 g/dL — ABNORMAL LOW (ref 13.0–17.1)
Lymphocytes Relative: 24 %
Lymphs Abs: 1.3 10*3/uL (ref 0.9–3.3)
MCH: 25 pg — ABNORMAL LOW (ref 27.2–33.4)
MCHC: 32.3 g/dL (ref 32.0–36.0)
MCV: 77.4 fL — ABNORMAL LOW (ref 79.3–98.0)
Monocytes Absolute: 0.4 10*3/uL (ref 0.1–0.9)
Monocytes Relative: 8 %
Neutro Abs: 3.5 10*3/uL (ref 1.5–6.5)
Neutrophils Relative %: 66 %
Platelets: 183 10*3/uL (ref 140–400)
RBC: 4.23 MIL/uL (ref 4.20–5.82)
RDW: 15.7 % — ABNORMAL HIGH (ref 11.0–14.6)
WBC: 5.3 10*3/uL (ref 4.0–10.3)

## 2018-05-26 LAB — MAGNESIUM: Magnesium: 2.4 mg/dL (ref 1.7–2.4)

## 2018-05-26 LAB — PHOSPHORUS: Phosphorus: 2.8 mg/dL (ref 2.5–4.6)

## 2018-05-26 MED ORDER — PROCHLORPERAZINE MALEATE 10 MG PO TABS
ORAL_TABLET | ORAL | Status: AC
  Filled 2018-05-26: qty 1

## 2018-05-26 MED ORDER — HEPARIN SOD (PORK) LOCK FLUSH 100 UNIT/ML IV SOLN
500.0000 [IU] | Freq: Once | INTRAVENOUS | Status: AC | PRN
  Administered 2018-05-26: 500 [IU]
  Filled 2018-05-26: qty 5

## 2018-05-26 MED ORDER — PROCHLORPERAZINE MALEATE 10 MG PO TABS
10.0000 mg | ORAL_TABLET | Freq: Once | ORAL | Status: AC
  Administered 2018-05-26: 10 mg via ORAL

## 2018-05-26 MED ORDER — SODIUM CHLORIDE 0.9% FLUSH
10.0000 mL | INTRAVENOUS | Status: DC | PRN
  Administered 2018-05-26: 10 mL
  Filled 2018-05-26: qty 10

## 2018-05-26 MED ORDER — SODIUM CHLORIDE 0.9 % IV SOLN
800.0000 mg/m2 | Freq: Once | INTRAVENOUS | Status: AC
  Administered 2018-05-26: 1710 mg via INTRAVENOUS
  Filled 2018-05-26: qty 45

## 2018-05-26 MED ORDER — SODIUM CHLORIDE 0.9 % IV SOLN
Freq: Once | INTRAVENOUS | Status: AC
  Administered 2018-05-26: 11:00:00 via INTRAVENOUS

## 2018-05-26 NOTE — Progress Notes (Signed)
Patient and spouse came in inquiring about financial assistance for rent.  Reviewed chart and advised patient he may apply for the one-time $400 [place]. Advised patient he may bring proof of he and spouse's income on tomorrow 05/27/18 to apply for grant. Gave my card for any additional financial questions or concerns.

## 2018-05-26 NOTE — Patient Instructions (Signed)
[facility] °Discharge Instructions for Patients Receiving Chemotherapy ° °Today you received the following chemotherapy agents Gemzar ° °To help prevent nausea and vomiting after your treatment, we encourage you to take your nausea medication as directed. °  °If you develop nausea and vomiting that is not controlled by your nausea medication, call the clinic.  ° °BELOW ARE SYMPTOMS THAT SHOULD BE REPORTED IMMEDIATELY: °· *FEVER GREATER THAN 100.5 F °· *CHILLS WITH OR WITHOUT FEVER °· NAUSEA AND VOMITING THAT IS NOT CONTROLLED WITH YOUR NAUSEA MEDICATION °· *UNUSUAL SHORTNESS OF BREATH °· *UNUSUAL BRUISING OR BLEEDING °· TENDERNESS IN MOUTH AND THROAT WITH OR WITHOUT PRESENCE OF ULCERS °· *URINARY PROBLEMS °· *BOWEL PROBLEMS °· UNUSUAL RASH °Items with * indicate a potential emergency and should be followed up as soon as possible. ° °Feel free to call the clinic should you have any questions or concerns. The clinic phone number is [phone]. ° °Please show the CHEMO ALERT CARD at check-in to the Emergency Department and triage nurse. ° ° °

## 2018-05-26 NOTE — Progress Notes (Signed)
Per Dr. Irene Limbo it is ok to treat with Creatinine of 1.60

## 2018-05-26 NOTE — Telephone Encounter (Signed)
Printed avs and calender of upcoming appointment. Per 6/19 los 

## 2018-05-27 ENCOUNTER — Encounter: Payer: Self-pay | Admitting: Hematology

## 2018-05-27 ENCOUNTER — Inpatient Hospital Stay: Payer: Medicare Other

## 2018-05-27 VITALS — BP 136/74 | HR 79 | Temp 98.6°F | Resp 16

## 2018-05-27 DIAGNOSIS — Z7189 Other specified counseling: Secondary | ICD-10-CM

## 2018-05-27 DIAGNOSIS — R05 Cough: Secondary | ICD-10-CM | POA: Diagnosis not present

## 2018-05-27 DIAGNOSIS — Z5111 Encounter for antineoplastic chemotherapy: Secondary | ICD-10-CM | POA: Diagnosis not present

## 2018-05-27 DIAGNOSIS — C679 Malignant neoplasm of bladder, unspecified: Secondary | ICD-10-CM | POA: Diagnosis not present

## 2018-05-27 DIAGNOSIS — Z5189 Encounter for other specified aftercare: Secondary | ICD-10-CM | POA: Diagnosis not present

## 2018-05-27 DIAGNOSIS — C7800 Secondary malignant neoplasm of unspecified lung: Secondary | ICD-10-CM

## 2018-05-27 MED ORDER — PEGFILGRASTIM-CBQV 6 MG/0.6ML ~~LOC~~ SOSY
6.0000 mg | PREFILLED_SYRINGE | Freq: Once | SUBCUTANEOUS | Status: AC
  Administered 2018-05-27: 6 mg via SUBCUTANEOUS

## 2018-05-27 MED ORDER — PEGFILGRASTIM-CBQV 6 MG/0.6ML ~~LOC~~ SOSY
PREFILLED_SYRINGE | SUBCUTANEOUS | Status: AC
  Filled 2018-05-27: qty 0.6

## 2018-05-27 NOTE — Progress Notes (Signed)
Patient and spouse came in to bring proof of income for one-time $400 [place].  Patient approved for grant. He has a copy of the approval letter as well as the expense sheet it covers along with the outpatient pharmacy information. He has my card for any additional financial questions or concerns.

## 2018-05-29 ENCOUNTER — Encounter (HOSPITAL_COMMUNITY): Payer: Self-pay

## 2018-05-29 ENCOUNTER — Other Ambulatory Visit: Payer: Self-pay

## 2018-05-29 ENCOUNTER — Emergency Department (HOSPITAL_COMMUNITY)
Admission: EM | Admit: 2018-05-29 | Discharge: 2018-05-29 | Disposition: A | Discharge status: Home / Self Care | Payer: Medicare Other | Attending: Emergency Medicine | Admitting: Emergency Medicine

## 2018-05-29 DIAGNOSIS — R350 Frequency of micturition: Secondary | ICD-10-CM | POA: Diagnosis not present

## 2018-05-29 DIAGNOSIS — Z79899 Other long term (current) drug therapy: Secondary | ICD-10-CM | POA: Insufficient documentation

## 2018-05-29 DIAGNOSIS — C679 Malignant neoplasm of bladder, unspecified: Secondary | ICD-10-CM | POA: Diagnosis not present

## 2018-05-29 DIAGNOSIS — N3000 Acute cystitis without hematuria: Secondary | ICD-10-CM | POA: Insufficient documentation

## 2018-05-29 DIAGNOSIS — R339 Retention of urine, unspecified: Secondary | ICD-10-CM | POA: Diagnosis present

## 2018-05-29 DIAGNOSIS — Z87891 Personal history of nicotine dependence: Secondary | ICD-10-CM | POA: Insufficient documentation

## 2018-05-29 DIAGNOSIS — I10 Essential (primary) hypertension: Secondary | ICD-10-CM | POA: Diagnosis not present

## 2018-05-29 DIAGNOSIS — R3912 Poor urinary stream: Secondary | ICD-10-CM | POA: Diagnosis not present

## 2018-05-29 DIAGNOSIS — R3915 Urgency of urination: Secondary | ICD-10-CM | POA: Diagnosis not present

## 2018-05-29 LAB — URINALYSIS, ROUTINE W REFLEX MICROSCOPIC
Bilirubin Urine: NEGATIVE
Glucose, UA: NEGATIVE mg/dL
Hgb urine dipstick: NEGATIVE
Ketones, ur: NEGATIVE mg/dL
Nitrite: NEGATIVE
Protein, ur: 100 mg/dL — AB
Specific Gravity, Urine: 1.018 (ref 1.005–1.030)
WBC, UA: >50 WBC/hpf — ABNORMAL HIGH (ref 0–5)
pH: 5 (ref 5.0–8.0)

## 2018-05-29 LAB — BASIC METABOLIC PANEL
Anion gap: 9 (ref 5–15)
BUN: 25 mg/dL — ABNORMAL HIGH (ref 6–20)
CO2: 27 mmol/L (ref 22–32)
Calcium: 8.2 mg/dL — ABNORMAL LOW (ref 8.9–10.3)
Chloride: 102 mmol/L (ref 101–111)
Creatinine, Ser: 1.75 mg/dL — ABNORMAL HIGH (ref 0.61–1.24)
GFR calc Af Amer: 44 mL/min — ABNORMAL LOW (ref >60)
GFR calc non Af Amer: 38 mL/min — ABNORMAL LOW (ref >60)
Glucose, Bld: 114 mg/dL — ABNORMAL HIGH (ref 65–99)
Potassium: 4.4 mmol/L (ref 3.5–5.1)
Sodium: 138 mmol/L (ref 135–145)

## 2018-05-29 MED ORDER — PHENAZOPYRIDINE HCL 200 MG PO TABS: 200.0000 mg | ORAL_TABLET | Freq: Three times a day (TID) | ORAL | 0 refills | Status: AC

## 2018-05-29 MED ORDER — PHENAZOPYRIDINE HCL 100 MG PO TABS
100.0000 mg | ORAL_TABLET | Freq: Once | ORAL | Status: AC
  Administered 2018-05-29: 100 mg via ORAL
  Filled 2018-05-29: qty 1

## 2018-05-29 MED ORDER — CEFTRIAXONE SODIUM 1 G IJ SOLR
1.0000 g | Freq: Once | INTRAMUSCULAR | Status: AC
  Administered 2018-05-29: 1 g via INTRAMUSCULAR
  Filled 2018-05-29: qty 10

## 2018-05-29 MED ORDER — SULFAMETHOXAZOLE-TRIMETHOPRIM 800-160 MG PO TABS: 1.0000 | ORAL_TABLET | Freq: Two times a day (BID) | ORAL | 0 refills | Status: AC

## 2018-05-29 MED ORDER — LIDOCAINE HCL 1 % IJ SOLN
INTRAMUSCULAR | Status: AC
  Administered 2018-05-29: 2.2 mL
  Filled 2018-05-29: qty 20

## 2018-05-29 NOTE — ED Notes (Signed)
Note: pt. Remains in no distress. I was able to collect only sufficient urine thus far for u/a, but not culture. I have reminded pt. We need another sample.

## 2018-05-29 NOTE — ED Provider Notes (Signed)
[place] DEPT Provider Note   CSN: [id number] Arrival date & time: 05/29/18  1421     History   Chief Complaint Chief Complaint  Patient presents with  . Urinary Retention  . cancer patient    HPI Jeremy Patrick is a 72 y.o. male.  Complaint is urinary hesitancy  HPI: 89-year-old male.  History of bladder cancer.  Had multiple treatments for this.  Has metastatic disease in his chest.  Undergoing IV chemotherapy.  Followed at the cancer center at [facility] long as well as [facility].  He is been waking up more at night for the last week to urinate and urinating more frequently with smaller amounts.  He is concerned that he might be retaining urine.  No fever.  No chills.  No other symptoms.  No back or flank pain  Past Medical History:  Diagnosis Date  . Cancer [facility])    bladder cancer  . GERD (gastroesophageal reflux disease)   . Hypertension     Patient Active Problem List   Diagnosis Date Noted  . Pulmonary metastases ([place]) 05/18/2018  . Counseling regarding advance care planning and goals of care 05/18/2018  . Chronic rhinitis 04/06/2018  . Erectile dysfunction 04/06/2018  . Chronic left shoulder pain 01/21/2018  . Hepatitis C antibody test positive 04/23/2017  . Medicare annual wellness visit, subsequent 03/30/2017  . Routine adult health maintenance 03/30/2017  . Malignant neoplasm of urinary bladder ([place]) 03/30/2017  . Essential hypertension 03/03/2017  . Gastroesophageal reflux disease without esophagitis 03/03/2017    Past Surgical History:  Procedure Laterality Date  . BLADDER SURGERY    . CYSTOSCOPY N/A 04/06/2017   Procedure: CYSTOSCOPY;  Surgeon: Raynelle Bring, MD;  Location: [facility];  Service: Urology;  Laterality: N/A;  . CYSTOSCOPY W/ RETROGRADES N/A 01/28/2018   Procedure: CYSTOSCOPY WITH RETROGRADE PYELOGRAM;  Surgeon: Raynelle Bring, MD;  Location: [facility];  Service: Urology;  Laterality: N/A;  . CYSTOSCOPY W/  URETERAL STENT PLACEMENT N/A 10/08/2017   Procedure: CYSTOSCOPY WITH BILATERAL  RETROGRADE PYELOGRAM/ LEFT URETERAL STENT PLACEMENT;  Surgeon: Raynelle Bring, MD;  Location: [facility];  Service: Urology;  Laterality: N/A;  . IR IMAGING GUIDED PORT INSERTION  05/21/2018  . TRANSURETHRAL RESECTION OF BLADDER TUMOR N/A 04/06/2017   Procedure: TRANSURETHRAL RESECTION OF BLADDER TUMOR (TURBT);  Surgeon: Raynelle Bring, MD;  Location: [facility];  Service: Urology;  Laterality: N/A;  . TRANSURETHRAL RESECTION OF BLADDER TUMOR N/A 10/08/2017   Procedure: TRANSURETHRAL RESECTION OF BLADDER TUMOR (TURBT);  Surgeon: Raynelle Bring, MD;  Location: [facility];  Service: Urology;  Laterality: N/A;  . TRANSURETHRAL RESECTION OF BLADDER TUMOR N/A 01/28/2018   Procedure: TRANSURETHRAL RESECTION OF BLADDER TUMOR (TURBT);  Surgeon: Raynelle Bring, MD;  Location: [facility];  Service: Urology;  Laterality: N/A;  GENERAL ANESTHESIA WITH PARALYSIS  . TRANSURETHRAL RESECTION OF BLADDER TUMOR WITH MITOMYCIN-C N/A 03/02/2017   Procedure: TRANSURETHRAL RESECTION OF BLADDER TUMOR/ EXAM UNDER ANESTHESIA/ WITH MITOMYCIN-C POST OPERATIVE;  Surgeon: Raynelle Bring, MD;  Location: [facility];  Service: Urology;  Laterality: N/A;        Home Medications    Prior to Admission medications   Medication Sig Start Date End Date Taking? Authorizing Provider  acetaminophen (TYLENOL) 500 MG tablet Take 1,000 mg by mouth daily as needed.   Yes [provider]  benzonatate (TESSALON) 100 MG capsule Take 1 capsule (100 mg total) by mouth 3 (three) times daily as needed for cough. 05/19/18  Yes Sullivan Lone  Vivien Rossetti, MD  Cyanocobalamin (VITAMIN B 12 PO) Take 1 tablet by mouth daily.   Yes [provider]  dexamethasone (DECADRON) 4 MG tablet Take 2 tablets (8 mg total) by mouth daily. Start the day after carboplatin chemotherapy for 2 days. 05/18/18  Yes Brunetta Genera, MD  diltiazem (CARDIZEM CD) 360 MG 24 hr capsule Take 1 capsule (360 mg  total) by mouth daily. Must keep scheduled appt w/new provider for future refills 04/30/18  Yes [place], Betty G, MD  dronabinol (MARINOL) 5 MG capsule Take 1 capsule (5 mg total) by mouth 2 (two) times daily before a meal. 05/19/18  Yes Brunetta Genera, MD  Ferrous Gluconate (IRON 27 PO) Take 27 mg by mouth daily.   Yes [provider]  Glucos-Chondroit-Hyaluron-MSM (GLUCOSAMINE CHONDROITIN JOINT PO) Take 1,500 mg by mouth daily.   Yes [provider]  LORazepam (ATIVAN) 0.5 MG tablet Take 1 tablet (0.5 mg total) by mouth every 6 (six) hours as needed (Nausea or vomiting). 05/18/18  Yes Brunetta Genera, MD  ondansetron (ZOFRAN) 8 MG tablet Take 1 tablet (8 mg total) by mouth 2 (two) times daily as needed for refractory nausea / vomiting. Start on day 3 after carboplatin chemo. 05/18/18  Yes Brunetta Genera, MD  ondansetron (ZOFRAN-ODT) 4 MG disintegrating tablet Take 1 tablet (4 mg total) by mouth every 8 (eight) hours as needed for nausea or vomiting. 04/28/18  Yes Brunetta Genera, MD  prochlorperazine (COMPAZINE) 10 MG tablet Take 1 tablet (10 mg total) by mouth every 6 (six) hours as needed (Nausea or vomiting). 05/18/18  Yes Brunetta Genera, MD  ranitidine (ZANTAC) 300 MG tablet Take 1 tablet (300 mg total) by mouth every morning. Must keep appt for future refills 04/30/18  Yes [place], Betty G, MD  guaiFENesin-dextromethorphan [facility] DM) 100-10 MG/5ML syrup Take 5 mLs by mouth 3 (three) times daily as needed for cough. Patient not taking: Reported on 05/06/2018 04/26/18   Davonna Belling, MD  lidocaine-prilocaine (EMLA) cream Apply to affected area once Patient not taking: Reported on 05/29/2018 05/18/18   Brunetta Genera, MD  phenazopyridine (PYRIDIUM) 200 MG tablet Take 1 tablet (200 mg total) by mouth 3 (three) times daily. 05/29/18   Tanna Furry, MD  sildenafil (VIAGRA) 50 MG tablet Take 1 tablet (50 mg total) by mouth daily as needed for erectile  dysfunction. Patient not taking: Reported on 05/29/2018 04/20/18   [place], Betty G, MD  sulfamethoxazole-trimethoprim (BACTRIM DS,SEPTRA DS) 800-160 MG tablet Take 1 tablet by mouth 2 (two) times daily for 7 days. 05/29/18 06/05/18  Tanna Furry, MD    Family History Family History  Problem Relation Age of Onset  . Hypertension Mother   . Stroke Mother   . Diabetes Mother   . Cataracts Mother   . Kidney disease Mother   . Hypertension Father   . Stroke Father   . Aneurysm Maternal Grandmother     Social History Social History   Tobacco Use  . Smoking status: Former Smoker    Packs/day: 0.15    Years: 10.00    Pack years: 1.50    Types: Cigarettes    Last attempt to quit: 1980    Years since quitting: 39.4  . Smokeless tobacco: Never Used  Substance Use Topics  . Alcohol use: Not Currently  . Drug use: No    Comment: "years ago"     Allergies   Patient has no known allergies.   Review of Systems  Review of Systems  Constitutional: Negative for appetite change, chills, diaphoresis, fatigue and fever.  HENT: Negative for mouth sores, sore throat and trouble swallowing.   Eyes: Negative for visual disturbance.  Respiratory: Negative for cough, chest tightness, shortness of breath and wheezing.   Cardiovascular: Negative for chest pain.  Gastrointestinal: Negative for abdominal distention, abdominal pain, diarrhea, nausea and vomiting.  Endocrine: Negative for polydipsia, polyphagia and polyuria.  Genitourinary: Positive for decreased urine volume, frequency and urgency. Negative for dysuria and hematuria.  Musculoskeletal: Negative for gait problem.  Skin: Negative for color change, pallor and rash.  Neurological: Negative for dizziness, syncope, light-headedness and headaches.  Hematological: Does not bruise/bleed easily.  Psychiatric/Behavioral: Negative for behavioral problems and confusion.     Physical Exam Updated Vital Signs BP 129/67   Pulse 78   Temp 99.3  F (37.4 C) (Oral)   Resp 16   Ht 6' (1.829 m)   Wt 84.4 kg (186 lb)   SpO2 95%   BMI 25.23 kg/m   Physical Exam  Constitutional: He is oriented to person, place, and time. He appears well-developed and well-nourished. No distress.  HENT:  Head: Normocephalic.  Eyes: Pupils are equal, round, and reactive to light. Conjunctivae are normal. No scleral icterus.  Neck: Normal range of motion. Neck supple. No thyromegaly present.  Cardiovascular: Normal rate and regular rhythm. Exam reveals no gallop and no friction rub.  No murmur heard. Pulmonary/Chest: Effort normal and breath sounds normal. No respiratory distress. He has no wheezes. He has no rales.  Port in his right chest.  Site appears well  Abdominal: Soft. Bowel sounds are normal. He exhibits no distension. There is no tenderness. There is no rebound.  No suprapubic tenderness or fullness.  Limited bedside ultrasound shows small amount of fluid within the bladder.  Musculoskeletal: Normal range of motion.  Neurological: He is alert and oriented to person, place, and time.  Skin: Skin is warm and dry. No rash noted.  Psychiatric: He has a normal mood and affect. His behavior is normal.     ED Treatments / Results  Labs (all labs ordered are listed, but only abnormal results are displayed) Labs Reviewed  URINALYSIS, ROUTINE W REFLEX MICROSCOPIC - Abnormal; Notable for the following components:      Result Value   APPearance CLOUDY (*)    Protein, ur 100 (*)    Leukocytes, UA LARGE (*)    WBC, UA >50 (*)    Bacteria, UA FEW (*)    Non Squamous Epithelial 0-5 (*)    All other components within normal limits  BASIC METABOLIC PANEL - Abnormal; Notable for the following components:   Glucose, Bld 114 (*)    BUN 25 (*)    Creatinine, Ser 1.75 (*)    Calcium 8.2 (*)    GFR calc non Af Amer 38 (*)    GFR calc Af Amer 44 (*)    All other components within normal limits  URINE CULTURE    EKG None  Radiology No results  found.  Procedures Procedures (including critical care time)  Medications Ordered in ED Medications  phenazopyridine (PYRIDIUM) tablet 100 mg (100 mg Oral Given 05/29/18 1706)  cefTRIAXone (ROCEPHIN) injection 1 g (1 g Intramuscular Given 05/29/18 1706)  lidocaine (XYLOCAINE) 1 % (with pres) injection (2.2 mLs  Given 05/29/18 1706)     Initial Impression / Assessment and Plan / ED Course  I have reviewed the triage vital signs and the nursing notes.  Pertinent labs & imaging results that were available during my care of the patient were reviewed by me and considered in my medical decision making (see chart for details).    Exam and ultrasound not consistent with urinary retention.  Symptoms may be secondary to UTI.  Await UA.  Will recheck lab.  Has chronic renal insufficiency.  We will ensure that he is at baseline.  Final Clinical Impressions(s) / ED Diagnoses   Final diagnoses:  Acute cystitis without hematuria    Given IM Rocephin.  Discharged with Bactrim and Pyridium.  Recheck with urology.  ED Discharge Orders        Ordered    phenazopyridine (PYRIDIUM) 200 MG tablet  3 times daily     05/29/18 1726    sulfamethoxazole-trimethoprim (BACTRIM DS,SEPTRA DS) 800-160 MG tablet  2 times daily     05/29/18 1726       Tanna Furry, MD 05/29/18 1727

## 2018-05-29 NOTE — ED Notes (Signed)
Bed: TY75 Expected date:  Expected time:  Means of arrival:  Comments: Tri 1

## 2018-05-29 NOTE — ED Triage Notes (Addendum)
Patient having frequent small amounts x 1 month. Patient has bladder cancer.

## 2018-05-29 NOTE — Discharge Instructions (Addendum)
Bactrim DS twice per day x7 days. Pyridium.  This will make your urine very orange.  I will also make you not feel as though you have to empty her bladder often. As your symptoms improve, you may stop the Pyridium.  You do not have to complete the 7-day course

## 2018-05-31 LAB — URINE CULTURE: Culture: <10000 — AB

## 2018-06-08 NOTE — Progress Notes (Signed)
HEMATOLOGY/ONCOLOGY CONSULTATION NOTE  Date of Service: 06/09/2018  Patient Care Team: Martinique, Betty G, MD as PCP - General (Family Medicine)  Raynelle Bring, MD as Urologist  CHIEF COMPLAINTS/PURPOSE OF CONSULTATION:  F/u for newly diagnosed Metastatic Urothelial bladder carcinoma   HISTORY OF PRESENTING ILLNESS:   Jeremy Patrick is a wonderful 72 y.o. male who has been referred to [place] by Dr Betty Martinique for evaluation and management of metastatic malignancy of unknown primary. He is accompanied today by his wife. The pt reports that he is doing well overall.   The pt reports that he first noticed hematuria two years ago. He has had multiple bladder tumors removed since then, with the last resection in February 2019. He denies knowledge of metastasis and has been followed by urology and hasn't been seen by medical oncology previously.  He notes that his hematuria has resolved. His last treatment with intravesical Mitomycin C was 2-3 months ago, soon after his last tumor resection.   He presented to the ED on 04/26/18 because he was having intractable coughing. He notes that he would cough so much that he began to throw up. He denies coughing up any blood and has brought up some clear phlegm. He first noticed his cough one month ago, and it worsened to the point that he presented to the ED a couple days ago.  He does endorse feeling sleepier in the last month, as he wasn't eating as well- which he describes as a change in the last month. He notes that he has lost 20-30 pounds in the last 2-3 months after beginning Mytomycin C. He felt chills and flu-like symptoms for 2 weeks after beginning Mitomycin C.   Of note prior to the patient's visit today, pt has had CT C/A/P completed on 04/26/18 in the ED with results revealing 1. Bilateral pulmonary nodules and masses, right greater than left are associated with right-sided pleural disease and small right pleural effusion. Metastatic disease in this  patient with history of bladder cancer would be a distinct consideration although thoracic primary not excluded. 2. Lower paraesophageal lymphadenopathy 3. Mild circumferential bladder wall thickening with a 10 mm apparent bladder wall nodule posteriorly on the left. 4. No evidence for metastatic disease in the abdomen or pelvis. 5. Cholelithiasis. 6.  Aortic Atherosclerois.   Most recent lab results (04/26/18) of CBC  is as follows: all values are WNL except for Hgb at 10.7, HCT at 36.4, MCH at 24.3, MCHC at 29.4, Monocytes Abs at 1.3k.  On review of systems, pt reports decreased appetite, weight loss, increased fatigue, productive cough, mild back pains, and denies back aches, head aches, fevers, chills, night sweats, changes in vision, difficulty swallowing, and any other symptoms.   On PMHx the pt reports chronic kidney disease, bladder cancer, hypertension, GERD. On Social Hx the pt reports frequent exposure to clorox and he works in Development worker, international aid.    INTERVAL HISTORY   Jeremy Patrick is here for follow up regarding his metastatic urothelial carcinoma and C2D1 of his Gemcitabine/Carboplatin treatment. The patient's last visit with [place] was on 05/26/18. He is accompanied today by his daughter and fiance. The pt reports that he is doing well overall.   The pt reports that he has some pain on his right side which he associates with lying down a lot. He also notes that he has been constipated and has had lots of gas. He presented to the ED on 05/29/18 with concerns for a  UTI and has 3 more days of Bactrim. He also notes that he has continued to take Dexamethasone and Marinol but his appetite has not improved. He has seen our dietician already, and he notes that he has "lost the taste" for food. He has also not consumed many nutritional supplements. He has lost 9 pound since 05/12/18. He denies any depression and notes that he is emotionally supported at this time.   The pt notes that he  would like an evaluation for home health services.   He notes that he is taking his Tessalon pearls 3-4 times each day and cough drops as needed. He continues to cough much of the day, and he notes that his coughing is worse when he lies on the left side. He denies coughing up any blood.   Lab results today (06/09/18) of CBC w/diff, CMP is as follows: all values are WNL except for WBC at 11.4k, RBC at 3.85, HGB at 9.5, HCT at 30.5, MCV at 79.2, MCH at 24.7, MCHC at 31.1, RDW at 17.5, PLT at 488k, ANC at 8.2k, Glucose at 117, Creatinine at 1.64, Albumin at 3.0, GFR at 47. Magnesium 06/09/18 is WNL at 2.4  On review of systems, pt reports gas, constipation, weight loss, very weak appetite, persisting cough, and denies fevers, chills, coughing up blood, leg swelling, and any other symptoms.   MEDICAL HISTORY:  Past Medical History:  Diagnosis Date  . Cancer [facility])    bladder cancer  . GERD (gastroesophageal reflux disease)   . Hypertension     SURGICAL HISTORY: Past Surgical History:  Procedure Laterality Date  . BLADDER SURGERY    . CYSTOSCOPY N/A 04/06/2017   Procedure: CYSTOSCOPY;  Surgeon: Raynelle Bring, MD;  Location: [facility];  Service: Urology;  Laterality: N/A;  . CYSTOSCOPY W/ RETROGRADES N/A 01/28/2018   Procedure: CYSTOSCOPY WITH RETROGRADE PYELOGRAM;  Surgeon: Raynelle Bring, MD;  Location: [facility];  Service: Urology;  Laterality: N/A;  . CYSTOSCOPY W/ URETERAL STENT PLACEMENT N/A 10/08/2017   Procedure: CYSTOSCOPY WITH BILATERAL  RETROGRADE PYELOGRAM/ LEFT URETERAL STENT PLACEMENT;  Surgeon: Raynelle Bring, MD;  Location: [facility];  Service: Urology;  Laterality: N/A;  . IR IMAGING GUIDED PORT INSERTION  05/21/2018  . TRANSURETHRAL RESECTION OF BLADDER TUMOR N/A 04/06/2017   Procedure: TRANSURETHRAL RESECTION OF BLADDER TUMOR (TURBT);  Surgeon: Raynelle Bring, MD;  Location: [facility];  Service: Urology;  Laterality: N/A;  . TRANSURETHRAL RESECTION OF BLADDER TUMOR N/A 10/08/2017   Procedure:  TRANSURETHRAL RESECTION OF BLADDER TUMOR (TURBT);  Surgeon: Raynelle Bring, MD;  Location: [facility];  Service: Urology;  Laterality: N/A;  . TRANSURETHRAL RESECTION OF BLADDER TUMOR N/A 01/28/2018   Procedure: TRANSURETHRAL RESECTION OF BLADDER TUMOR (TURBT);  Surgeon: Raynelle Bring, MD;  Location: [facility];  Service: Urology;  Laterality: N/A;  GENERAL ANESTHESIA WITH PARALYSIS  . TRANSURETHRAL RESECTION OF BLADDER TUMOR WITH MITOMYCIN-C N/A 03/02/2017   Procedure: TRANSURETHRAL RESECTION OF BLADDER TUMOR/ EXAM UNDER ANESTHESIA/ WITH MITOMYCIN-C POST OPERATIVE;  Surgeon: Raynelle Bring, MD;  Location: [facility];  Service: Urology;  Laterality: N/A;    SOCIAL HISTORY: Social History   Socioeconomic History  . Marital status: Married    Spouse name: Not on file  . Number of children: 4  . Years of education: 10  . Highest education level: Not on file  Occupational History  . Occupation: Retired  Scientific laboratory technician  . Financial resource strain: Not on file  . Food insecurity:    Worry: Not on file  Inability: Not on file  . Transportation needs:    Medical: Not on file    Non-medical: Not on file  Tobacco Use  . Smoking status: Former Smoker    Packs/day: 0.15    Years: 10.00    Pack years: 1.50    Types: Cigarettes    Last attempt to quit: 1980    Years since quitting: 39.5  . Smokeless tobacco: Never Used  Substance and Sexual Activity  . Alcohol use: Not Currently  . Drug use: No    Comment: "years ago"  . Sexual activity: Not on file  Lifestyle  . Physical activity:    Days per week: Not on file    Minutes per session: Not on file  . Stress: Not on file  Relationships  . Social connections:    Talks on phone: Not on file    Gets together: Not on file    Attends religious service: Not on file    Active member of club or organization: Not on file    Attends meetings of clubs or organizations: Not on file    Relationship status: Not on file  . Intimate partner violence:     Fear of current or ex partner: Not on file    Emotionally abused: Not on file    Physically abused: Not on file    Forced sexual activity: Not on file  Other Topics Concern  . Not on file  Social History Narrative   Fun/Hobby: Go to chur[employer] and travel.     FAMILY HISTORY: Family History  Problem Relation Age of Onset  . Hypertension Mother   . Stroke Mother   . Diabetes Mother   . Cataracts Mother   . Kidney disease Mother   . Hypertension Father   . Stroke Father   . Aneurysm Maternal Grandmother     ALLERGIES:  has No Known Allergies.  MEDICATIONS:  Current Outpatient Medications  Medication Sig Dispense Refill  . acetaminophen (TYLENOL) 500 MG tablet Take 1,000 mg by mouth daily as needed.    . benzonatate (TESSALON) 100 MG capsule Take 1 capsule (100 mg total) by mouth 3 (three) times daily as needed for cough. 30 capsule 0  . Cyanocobalamin (VITAMIN B 12 PO) Take 1 tablet by mouth daily.    Marland Kitchen dexamethasone (DECADRON) 4 MG tablet Take 2 tablets (8 mg total) by mouth daily. Start the day after carboplatin chemotherapy for 2 days. 30 tablet 1  . diltiazem (CARDIZEM CD) 360 MG 24 hr capsule Take 1 capsule (360 mg total) by mouth daily. Must keep scheduled appt w/new provider for future refills 90 capsule 0  . dronabinol (MARINOL) 5 MG capsule Take 1 capsule (5 mg total) by mouth 2 (two) times daily before a meal. 60 capsule 0  . Ferrous Gluconate (IRON 27 PO) Take 27 mg by mouth daily.    . Glucos-Chondroit-Hyaluron-MSM (GLUCOSAMINE CHONDROITIN JOINT PO) Take 1,500 mg by mouth daily.    Marland Kitchen guaiFENesin-dextromethorphan (ROBITUSSIN DM) 100-10 MG/5ML syrup Take 5 mLs by mouth 3 (three) times daily as needed for cough. (Patient not taking: Reported on 05/06/2018) 118 mL 0  . lidocaine-prilocaine (EMLA) cream Apply to affected area once (Patient not taking: Reported on 05/29/2018) 30 g 3  . LORazepam (ATIVAN) 0.5 MG tablet Take 1 tablet (0.5 mg total) by mouth every 6 (six) hours as  needed (Nausea or vomiting). 30 tablet 0  . ondansetron (ZOFRAN) 8 MG tablet Take 1 tablet (8 mg total) by mouth  2 (two) times daily as needed for refractory nausea / vomiting. Start on day 3 after carboplatin chemo. 30 tablet 1  . ondansetron (ZOFRAN-ODT) 4 MG disintegrating tablet Take 1 tablet (4 mg total) by mouth every 8 (eight) hours as needed for nausea or vomiting. 30 tablet 0  . phenazopyridine (PYRIDIUM) 200 MG tablet Take 1 tablet (200 mg total) by mouth 3 (three) times daily. 6 tablet 0  . prochlorperazine (COMPAZINE) 10 MG tablet Take 1 tablet (10 mg total) by mouth every 6 (six) hours as needed (Nausea or vomiting). 30 tablet 1  . ranitidine (ZANTAC) 300 MG tablet Take 1 tablet (300 mg total) by mouth every morning. Must keep appt for future refills 90 tablet 0  . sildenafil (VIAGRA) 50 MG tablet Take 1 tablet (50 mg total) by mouth daily as needed for erectile dysfunction. (Patient not taking: Reported on 05/29/2018) 20 tablet 3   No current facility-administered medications for this visit.    Facility-Administered Medications Ordered in Other Visits  Medication Dose Route Frequency Provider Last Rate Last Dose  . 0.9 %  sodium chloride infusion   Intravenous Once Brunetta Genera, MD      . CARBOplatin (PARAPLATIN) 350 mg in sodium chloride 0.9 % 100 mL chemo infusion  350 mg Intravenous Once Brunetta Genera, MD      . fosaprepitant (EMEND) 150 mg, dexamethasone (DECADRON) 12 mg in sodium chloride 0.9 % 145 mL IVPB   Intravenous Once Brunetta Genera, MD      . gemcitabine (GEMZAR) 1,710 mg in sodium chloride 0.9 % 100 mL chemo infusion  800 mg/m2 (Treatment Plan Recorded) Intravenous Once Brunetta Genera, MD      . heparin lock flush 100 unit/mL  500 Units Intracatheter Once PRN Brunetta Genera, MD      . palonosetron (ALOXI) injection 0.25 mg  0.25 mg Intravenous Once Brunetta Genera, MD      . sodium chloride flush (NS) 0.9 % injection 10 mL  10 mL  Intracatheter PRN Brunetta Genera, MD        REVIEW OF SYSTEMS:  A 10+ POINT REVIEW OF SYSTEMS WAS OBTAINED including neurology, dermatology, psychiatry, cardiac, respiratory, lymph, extremities, GI, GU, Musculoskeletal, constitutional, breasts, reproductive, HEENT.  All pertinent positives are noted in the HPI.  All others are negative.   PHYSICAL EXAMINATION: ECOG PERFORMANCE STATUS: 1 - Symptomatic but completely ambulatory  VS reviewed GENERAL:alert, in no acute distress and comfortable SKIN: no acute rashes, no significant lesions EYES: conjunctiva are pink and non-injected, sclera anicteric OROPHARYNX: MMM, no exudates, no oropharyngeal erythema or ulceration NECK: supple, no JVD LYMPH:  no palpable lymphadenopathy in the cervical, axillary or inguinal regions LUNGS: clear to auscultation b/l with normal respiratory effort HEART: regular rate & rhythm ABDOMEN:  normoactive bowel sounds , non tender, not distended. No palpable hepatosplenomegaly.  Extremity: no pedal edema PSYCH: alert & oriented x 3 with fluent speech NEURO: no focal motor/sensory deficits   LABORATORY DATA:  I have reviewed the data as listed  . CBC Latest Ref Rng & Units 06/09/2018 05/26/2018 05/21/2018  WBC 4.0 - 10.3 K/uL 11.4(H) 5.3 13.4(H)  Hemoglobin 13.0 - 17.1 g/dL 9.5(L) 10.6(L) 10.8(L)  Hematocrit 38.4 - 49.9 % 30.5(L) 32.7(L) 34.1(L)  Platelets 140 - 400 K/uL 488(H) 183 405(H)    . CMP Latest Ref Rng & Units 06/09/2018 05/29/2018 05/26/2018  Glucose 70 - 99 mg/dL 117(H) 114(H) 117  BUN 8 - 23 mg/dL 18 25(H)  22  Creatinine 0.61 - 1.24 mg/dL 1.64(H) 1.75(H) 1.60(H)  Sodium 135 - 145 mmol/L 137 138 137  Potassium 3.5 - 5.1 mmol/L 4.5 4.4 4.1  Chloride 98 - 111 mmol/L 102 102 100  CO2 22 - 32 mmol/L 27 27 28   Calcium 8.9 - 10.3 mg/dL 9.3 8.2(L) 9.4  Total Protein 6.5 - 8.1 g/dL 7.3 - 7.5  Total Bilirubin 0.3 - 1.2 mg/dL 0.3 - 0.5  Alkaline Phos 38 - 126 U/L 120 - 80  AST 15 - 41 U/L 39 - 27    ALT 0 - 44 U/L 37 - 33    PATHOLOGY  DIAGNOSIS:  05/06/18 A. PLEURAL MASS, RIGHT; CT-GUIDED BIOPSY:  - METASTATIC HIGH-GRADE UROTHELIAL CARCINOMA. Comment:  The patient has a history of invasive high-grade urothelial carcinoma with glandular (10%) and small cell differentiation (no more than 5%) diagnosed in a TURBT specimen of the left bladder neck from March 2018 at an outside hospital.  Given the patient's history a limited panel of immunohistochemical  stains was performed with the following pattern of immunoreactivity:  GATA-3: Positive  CD56: Negative  TTF-1: Negative  Napsin: Negative  The immunohistochemical and morphologic findings are consistent with metastatic high-grade urothelial carcinoma. The slides from the outside  prior TURBT ([id number]) were requested and reviewed in conjunction with this case. While a glandular and small cell component was identified in the 2018 specimen those morphologic patterns are not identified in the current sample.  These findings were communicated to Dr. Irene Limbo via [facility] in basket on 05/10/2018. IHC slides were prepared by [facility], [place], [place]. All controls stained appropriately. This test was developed and its performance characteristics determined  by [facility]. It has not been cleared or approved by the [place] Food and Drug  Administration. The FDA does not require this test to go through  premarket FDA review. This test is used for clinical purposes. It should  not be regarded as investigational or for research. This laboratory is  certified under the Clinical Laboratory Improvement Amendments (CLIA) as  qualified to perform high complexity clinical laboratory testing.    01/28/18 Bx:    03/02/17 Bx:     RADIOGRAPHIC STUDIES: I have personally reviewed the radiological images as listed and agreed with the findings in the report. Mr Brain Wo Contrast  Result Date: 05/19/2018 CLINICAL DATA:  Staging of metastatic  urothelial carcinoma. Noncontrast study requested due to renal dysfunction. EXAM: MRI HEAD WITHOUT CONTRAST TECHNIQUE: Multiplanar, multiecho pulse sequences of the brain and surrounding structures were obtained without intravenous contrast. COMPARISON:  None. FINDINGS: Brain: There is no evidence of acute infarct, intracranial hemorrhage, intracranial mass effect, or extra-axial fluid collection. Mild generalized cerebral atrophy is not greater than expected for age. Patchy subcortical and deep cerebral white matter T2 hyperintensities are nonspecific but compatible with mild-to-moderate chronic small vessel ischemic disease. Chronic lacunar infarcts are noted in the right paramedian pons and right putamen. No mass is identified, however sensitivity for detection of very small lesions is reduced by the lack of IV contrast. No vasogenic edema type signal is present. Vascular: Major intracranial vascular flow voids are preserved. Skull and upper cervical spine: Unremarkable bone marrow signal. Sinuses/Orbits: Unremarkable orbits. Paranasal sinuses and mastoid air cells are clear. Other: None. IMPRESSION: 1. No evidence of intracranial metastatic disease on this unenhanced examination. 2. Mild-to-moderate chronic small vessel ischemic disease. Electronically Signed   By: Logan Bores M.D.   On: 05/19/2018 09:05  Nm Pet Image Initial (pi) Skull Base To Thigh  Result Date: 05/10/2018 CLINICAL DATA:  Initial treatment strategy for metastatic disease of unknown primary. History of bladder cancer. EXAM: NUCLEAR MEDICINE PET SKULL BASE TO THIGH TECHNIQUE: 9.6 mCi F-18 FDG was injected intravenously. Full-ring PET imaging was performed from the skull base to thigh after the radiotracer. CT data was obtained and used for attenuation correction and anatomic localization. Fasting blood glucose: 96 mg/dl COMPARISON:  04/26/2018 chest abdomen and pelvic CTs. FINDINGS: Mediastinal blood pool activity: SUV max 2.7 NECK: No  areas of abnormal hypermetabolism. Incidental CT findings: No cervical adenopathy. CHEST: Extensive right-sided pleural metastasis with small right-sided pleural effusion. Index inferior right sided pleural implant measures on the order of 3.4 cm and a S.U.V. max of 12.9 on image 89/4. Thoracic nodal metastasis, with an index subcarinal node measuring 7 mm and a S.U.V. max of 3.9 on image 76/4. Bilateral pulmonary parenchymal metastasis. Right apical lung mass measures 2.9 cm and a S.U.V. max of 12.9 on image 58/4. A left upper lobe pulmonary nodule measures 1.3 cm and a S.U.V. max of 12.5 on image 64/4. Incidental CT findings: The right-sided pleural effusion is slightly increased. Other chest findings deferred to recent diagnostic CT. ABDOMEN/PELVIS: No areas of abnormal hypermetabolism. Incidental CT findings: Multiple gallstones. Normal adrenal glands. Abdominopelvic findings deferred to recent diagnostic CT. SKELETON: No abnormal marrow activity. Incidental CT findings: No focal osseous lesion. IMPRESSION: 1. Widespread metastatic disease within the chest, including bilateral pulmonary parenchymal, thoracic nodal, and right-sided pleural disease. 2. No evidence of extrathoracic metastatic disease. Electronically Signed   By: Abigail Miyamoto M.D.   On: 05/10/2018 14:41   Ir Imaging Guided Port Insertion  Result Date: 05/21/2018 CLINICAL DATA:  Metastatic urothelial carcinoma EXAM: RIGHT INTERNAL JUGULAR SINGLE LUMEN POWER PORT CATHETER INSERTION Date:  05/21/2018 05/21/2018 2:20 pm Radiologist:  M. Daryll Brod, MD Guidance:  Ultrasound and fluoroscopic MEDICATIONS: Ancef 2 g; The antibiotic was administered within an appropriate time interval prior to skin puncture. ANESTHESIA/SEDATION: Versed 2.0 mg IV; Fentanyl 100 mcg IV; Moderate Sedation Time:  36 minutes The patient was continuously monitored during the procedure by the interventional radiology nurse under my direct supervision. FLUOROSCOPY TIME:  One  minutes, 36 seconds (19 mGy) COMPLICATIONS: None immediate. CONTRAST:  None. PROCEDURE: Informed consent was obtained from the patient following explanation of the procedure, risks, benefits and alternatives. The patient understands, agrees and consents for the procedure. All questions were addressed. A time out was performed. Maximal barrier sterile technique utilized including caps, mask, sterile gowns, sterile gloves, large sterile drape, hand hygiene, and 2% chlorhexidine scrub. Under sterile conditions and local anesthesia, right internal jugular micropuncture venous access was performed. Access was performed with ultrasound. Images were obtained for documentation of the patent right internal jugular vein. A guide wire was inserted followed by a transitional dilator. This allowed insertion of a guide wire and catheter into the IVC. Measurements were obtained from the SVC / RA junction back to the right IJ venotomy site. In the right infraclavicular chest, a subcutaneous pocket was created over the second anterior rib. This was done under sterile conditions and local anesthesia. 1% lidocaine with epinephrine was utilized for this. A 2.5 cm incision was made in the skin. Blunt dissection was performed to create a subcutaneous pocket over the right pectoralis major muscle. The pocket was flushed with saline vigorously. There was adequate hemostasis. The port catheter was assembled and checked for leakage. The port catheter was secured  in the pocket with two retention sutures. The tubing was tunneled subcutaneously to the right venotomy site and inserted into the SVC/RA junction through a valved peel-away sheath. Position was confirmed with fluoroscopy. Images were obtained for documentation. The patient tolerated the procedure well. No immediate complications. Incisions were closed in a two layer fashion with 4 - 0 Vicryl suture. Dermabond was applied to the skin. The port catheter was accessed, blood was  aspirated followed by saline and heparin flushes. Needle was removed. A dry sterile dressing was applied. IMPRESSION: Ultrasound and fluoroscopically guided right internal jugular single lumen power port catheter insertion. Tip in the SVC/RA junction. Catheter ready for use. Electronically Signed   By: Jerilynn Mages.  Shick M.D.   On: 05/21/2018 17:01    ASSESSMENT & PLAN:  72 y.o. male with  1. 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   -04/26/18 CT C/A/P which revealed Bilateral pulmonary nodules and masses, right greater than left are associated with right-sided pleural disease and small right pleural effusion.  -CXR 02/27/17 did not reveal any lung masses -Pathology from March 2018 showed a small cell component and invasion into the urinary bladder muscle -PET from 05/10/18 which shows wide spread metastatic disease in the chest, including bilateral pulmonary parenchymal, thoracic nodal, and right-sided pleural disease. No evidence of extrathoracic metastatic disease. -Biopsy from 05/06/18 which shows evidence of metastatic high grade urothelial carcinoma. He has recurrent now high grade metastatic disease to the lung.  MRI brain: No evidence of intracranial metastatic disease  2. Intractable cough from pulmonary metastases -On tessalon perles with some relief  PLAN -Discussed pt labwork today, 06/09/18; Hgb decreased to 9.5, PLT increased to 488k, ANC at 8.2k, Magnesium normal at 2.4. Creatinine is stable.  -The pt has no prohibitive toxicities from continuing Gemcitrabine/Carboplatin at this time and will continue same dose of ctx -Will prescribe Vantin for possible UTI -Recommend Magnesium citrate until bowel  movement is achieved  -Will order Senna S for constipation relief -Stressed the importance of increasing hydration -Stressed the importance of the pt increasing his nutritional status and recommended blending ice cream into boost/ensure -Will put in a request for home health evaluation -Recommended that the pt increase his activity levels as much as he can reasonably tolerate    Home health referral to advanced home care Complete C2 as scheduled Please schedule C3 of treatment Carbo/gemcitabine RTC with Dr Irene Limbo in 3weeks with labs with C3D1 of treatment     All of the patients questions were answered with apparent satisfaction. The patient knows to call the clinic with any problems, questions or concerns.  The total time spent in the appt was 25 minutes and more than 50% was on counseling and direct patient cares.     Sullivan Lone MD MS AAHIVMS [facility] Hematology/Oncology Physician [facility]  (Office):       [phone] (Work cell):  [phone] (Fax):           [phone]  06/09/2018 11:22 AM  I, Baldwin [place], am acting as a scribe for Dr Irene Limbo.   Marland Kitchen.I have reviewed the above documentation for accuracy and completeness, and I agree with the above. Brunetta Genera MD

## 2018-06-09 ENCOUNTER — Inpatient Hospital Stay: Payer: Medicare Other | Admitting: Nutrition

## 2018-06-09 ENCOUNTER — Inpatient Hospital Stay (HOSPITAL_BASED_OUTPATIENT_CLINIC_OR_DEPARTMENT_OTHER): Payer: Medicare Other | Admitting: Hematology

## 2018-06-09 ENCOUNTER — Inpatient Hospital Stay: Payer: Medicare Other | Attending: Hematology

## 2018-06-09 ENCOUNTER — Inpatient Hospital Stay: Payer: Medicare Other

## 2018-06-09 VITALS — BP 139/87 | HR 87 | Temp 98.3°F | Resp 16 | Ht 72.0 in | Wt 171.8 lb

## 2018-06-09 DIAGNOSIS — C679 Malignant neoplasm of bladder, unspecified: Secondary | ICD-10-CM | POA: Insufficient documentation

## 2018-06-09 DIAGNOSIS — R05 Cough: Secondary | ICD-10-CM | POA: Diagnosis not present

## 2018-06-09 DIAGNOSIS — R059 Cough, unspecified: Secondary | ICD-10-CM

## 2018-06-09 DIAGNOSIS — M549 Dorsalgia, unspecified: Secondary | ICD-10-CM | POA: Diagnosis not present

## 2018-06-09 DIAGNOSIS — K219 Gastro-esophageal reflux disease without esophagitis: Secondary | ICD-10-CM | POA: Diagnosis not present

## 2018-06-09 DIAGNOSIS — C7800 Secondary malignant neoplasm of unspecified lung: Secondary | ICD-10-CM

## 2018-06-09 DIAGNOSIS — Z7901 Long term (current) use of anticoagulants: Secondary | ICD-10-CM | POA: Insufficient documentation

## 2018-06-09 DIAGNOSIS — C7910 Secondary malignant neoplasm of unspecified urinary organs: Secondary | ICD-10-CM | POA: Diagnosis not present

## 2018-06-09 DIAGNOSIS — R590 Localized enlarged lymph nodes: Secondary | ICD-10-CM | POA: Insufficient documentation

## 2018-06-09 DIAGNOSIS — R630 Anorexia: Secondary | ICD-10-CM

## 2018-06-09 DIAGNOSIS — R634 Abnormal weight loss: Secondary | ICD-10-CM | POA: Insufficient documentation

## 2018-06-09 DIAGNOSIS — C771 Secondary and unspecified malignant neoplasm of intrathoracic lymph nodes: Secondary | ICD-10-CM | POA: Diagnosis not present

## 2018-06-09 DIAGNOSIS — R5383 Other fatigue: Secondary | ICD-10-CM | POA: Diagnosis not present

## 2018-06-09 DIAGNOSIS — I700 Atherosclerosis of aorta: Secondary | ICD-10-CM | POA: Insufficient documentation

## 2018-06-09 DIAGNOSIS — K8020 Calculus of gallbladder without cholecystitis without obstruction: Secondary | ICD-10-CM | POA: Diagnosis not present

## 2018-06-09 DIAGNOSIS — I82611 Acute embolism and thrombosis of superficial veins of right upper extremity: Secondary | ICD-10-CM | POA: Insufficient documentation

## 2018-06-09 DIAGNOSIS — Z5111 Encounter for antineoplastic chemotherapy: Secondary | ICD-10-CM | POA: Insufficient documentation

## 2018-06-09 DIAGNOSIS — I82621 Acute embolism and thrombosis of deep veins of right upper extremity: Secondary | ICD-10-CM | POA: Insufficient documentation

## 2018-06-09 DIAGNOSIS — I129 Hypertensive chronic kidney disease with stage 1 through stage 4 chronic kidney disease, or unspecified chronic kidney disease: Secondary | ICD-10-CM | POA: Diagnosis not present

## 2018-06-09 DIAGNOSIS — Z79899 Other long term (current) drug therapy: Secondary | ICD-10-CM | POA: Insufficient documentation

## 2018-06-09 DIAGNOSIS — Z87891 Personal history of nicotine dependence: Secondary | ICD-10-CM | POA: Diagnosis not present

## 2018-06-09 DIAGNOSIS — C782 Secondary malignant neoplasm of pleura: Secondary | ICD-10-CM | POA: Insufficient documentation

## 2018-06-09 DIAGNOSIS — N189 Chronic kidney disease, unspecified: Secondary | ICD-10-CM | POA: Diagnosis not present

## 2018-06-09 DIAGNOSIS — Z7189 Other specified counseling: Secondary | ICD-10-CM

## 2018-06-09 LAB — CMP (CANCER CENTER ONLY)
ALT: 37 U/L (ref 0–44)
AST: 39 U/L (ref 15–41)
Albumin: 3 g/dL — ABNORMAL LOW (ref 3.5–5.0)
Alkaline Phosphatase: 120 U/L (ref 38–126)
Anion gap: 8 (ref 5–15)
BUN: 18 mg/dL (ref 8–23)
CO2: 27 mmol/L (ref 22–32)
Calcium: 9.3 mg/dL (ref 8.9–10.3)
Chloride: 102 mmol/L (ref 98–111)
Creatinine: 1.64 mg/dL — ABNORMAL HIGH (ref 0.61–1.24)
GFR, Est AFR Am: 47 mL/min — ABNORMAL LOW (ref >60)
GFR, Estimated: 41 mL/min — ABNORMAL LOW (ref >60)
Glucose, Bld: 117 mg/dL — ABNORMAL HIGH (ref 70–99)
Potassium: 4.5 mmol/L (ref 3.5–5.1)
Sodium: 137 mmol/L (ref 135–145)
Total Bilirubin: 0.3 mg/dL (ref 0.3–1.2)
Total Protein: 7.3 g/dL (ref 6.5–8.1)

## 2018-06-09 LAB — CBC WITH DIFFERENTIAL/PLATELET
Basophils Absolute: 0 10*3/uL (ref 0.0–0.1)
Basophils Relative: 0 %
Eosinophils Absolute: 0 10*3/uL (ref 0.0–0.5)
Eosinophils Relative: 0 %
HCT: 30.5 % — ABNORMAL LOW (ref 38.4–49.9)
Hemoglobin: 9.5 g/dL — ABNORMAL LOW (ref 13.0–17.1)
Lymphocytes Relative: 14 %
Lymphs Abs: 1.5 10*3/uL (ref 0.9–3.3)
MCH: 24.7 pg — ABNORMAL LOW (ref 27.2–33.4)
MCHC: 31.1 g/dL — ABNORMAL LOW (ref 32.0–36.0)
MCV: 79.2 fL — ABNORMAL LOW (ref 79.3–98.0)
Monocytes Absolute: 1.6 10*3/uL — ABNORMAL HIGH (ref 0.1–0.9)
Monocytes Relative: 14 %
Neutro Abs: 8.2 10*3/uL — ABNORMAL HIGH (ref 1.5–6.5)
Neutrophils Relative %: 72 %
Platelets: 488 10*3/uL — ABNORMAL HIGH (ref 140–400)
RBC: 3.85 MIL/uL — ABNORMAL LOW (ref 4.20–5.82)
RDW: 17.5 % — ABNORMAL HIGH (ref 11.0–14.6)
WBC: 11.4 10*3/uL — ABNORMAL HIGH (ref 4.0–10.3)

## 2018-06-09 LAB — MAGNESIUM: Magnesium: 2.4 mg/dL (ref 1.7–2.4)

## 2018-06-09 MED ORDER — SODIUM CHLORIDE 0.9 % IV SOLN
Freq: Once | INTRAVENOUS | Status: AC
  Administered 2018-06-09: 11:00:00 via INTRAVENOUS

## 2018-06-09 MED ORDER — PALONOSETRON HCL INJECTION 0.25 MG/5ML
INTRAVENOUS | Status: AC
Start: 2018-06-09 — End: ?
  Filled 2018-06-09: qty 5

## 2018-06-09 MED ORDER — SENNOSIDES-DOCUSATE SODIUM 8.6-50 MG PO TABS: 2.0000 | ORAL_TABLET | Freq: Every day | ORAL | 1 refills | Status: AC

## 2018-06-09 MED ORDER — PALONOSETRON HCL INJECTION 0.25 MG/5ML
0.2500 mg | Freq: Once | INTRAVENOUS | Status: AC
  Administered 2018-06-09: 0.25 mg via INTRAVENOUS

## 2018-06-09 MED ORDER — MAGNESIUM CITRATE PO SOLN: 0.5000 | Freq: Once | ORAL | 0 refills | Status: AC

## 2018-06-09 MED ORDER — SODIUM CHLORIDE 0.9 % IV SOLN
Freq: Once | INTRAVENOUS | Status: AC
  Administered 2018-06-09: 12:00:00 via INTRAVENOUS
  Filled 2018-06-09: qty 5

## 2018-06-09 MED ORDER — SODIUM CHLORIDE 0.9% FLUSH
10.0000 mL | INTRAVENOUS | Status: DC | PRN
  Administered 2018-06-09: 10 mL
  Filled 2018-06-09: qty 10

## 2018-06-09 MED ORDER — HEPARIN SOD (PORK) LOCK FLUSH 100 UNIT/ML IV SOLN
500.0000 [IU] | Freq: Once | INTRAVENOUS | Status: AC | PRN
  Administered 2018-06-09: 500 [IU]
  Filled 2018-06-09: qty 5

## 2018-06-09 MED ORDER — SODIUM CHLORIDE 0.9 % IV SOLN
800.0000 mg/m2 | Freq: Once | INTRAVENOUS | Status: AC
  Administered 2018-06-09: 1710 mg via INTRAVENOUS
  Filled 2018-06-09: qty 45

## 2018-06-09 MED ORDER — SODIUM CHLORIDE 0.9 % IV SOLN
350.0000 mg | Freq: Once | INTRAVENOUS | Status: AC
  Administered 2018-06-09: 350 mg via INTRAVENOUS
  Filled 2018-06-09: qty 35

## 2018-06-09 MED ORDER — CEFPODOXIME PROXETIL 100 MG PO TABS: 100.0000 mg | ORAL_TABLET | Freq: Two times a day (BID) | ORAL | 0 refills | Status: AC

## 2018-06-09 NOTE — Progress Notes (Signed)
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  Weight decreased significantly to 171 pounds July 3 down from 195.6 pounds June 5. Patient reports he has not been eating very much because he has no appetite for food. He complains of increased gas and constipation. Reports hemorrhoids are painful. He has boost at home however he has not been drinking it on a regular basis. Patient reports he sleeps a lot and he knows he should be more active.  Nutrition diagnosis: Unintended weight loss continues.  Severe malnutrition in the context of chronic illness secondary to metastatic cancer as evidenced by 13% weight loss over 1 month and depletion of muscle mass and fat loss on physical exam.  Intervention: Patient educated to speak with physician about increased gas and constipation. Encouraged patient to try to eat meals 3 times a day. Recommended patient increase boost +4 times daily.  Provided coupons. Encourage patient to increase physical activity as safe for increased strength and energy. Questions were answered.  Teach back method used.  Contact information provided.  Monitoring, evaluation, goals: Patient will work to increase calories and protein to minimize further weight loss.  Next visit: Wednesday, July 10 during infusion.  **Disclaimer: This note was dictated with voice recognition software. Similar sounding words can inadvertently be transcribed and this note may contain transcription errors which may not have been corrected upon publication of note.**

## 2018-06-09 NOTE — Progress Notes (Signed)
Ok to treat with Creatinine today per MD Irene Limbo

## 2018-06-09 NOTE — Progress Notes (Signed)
Verified w/ MD/RN - Keep gemcitabine dose at 800 mg/m2.   Demetrius Charity, PharmD Clinical Pharmacist Pharmacy Phone: [phone] 06/09/2018

## 2018-06-09 NOTE — Patient Instructions (Signed)
[facility] Discharge Instructions for Patients Receiving Chemotherapy  Today you received the following chemotherapy agents Gemzar and Carboplatin   To help prevent nausea and vomiting after your treatment, we encourage you to take your nausea medication as directed.    If you develop nausea and vomiting that is not controlled by your nausea medication, call the clinic.   BELOW ARE SYMPTOMS THAT SHOULD BE REPORTED IMMEDIATELY:  *FEVER GREATER THAN 100.5 F  *CHILLS WITH OR WITHOUT FEVER  NAUSEA AND VOMITING THAT IS NOT CONTROLLED WITH YOUR NAUSEA MEDICATION  *UNUSUAL SHORTNESS OF BREATH  *UNUSUAL BRUISING OR BLEEDING  TENDERNESS IN MOUTH AND THROAT WITH OR WITHOUT PRESENCE OF ULCERS  *URINARY PROBLEMS  *BOWEL PROBLEMS  UNUSUAL RASH Items with * indicate a potential emergency and should be followed up as soon as possible.  Feel free to call the clinic should you have any questions or concerns. The clinic phone number is [phone].  Please show the CHEMO ALERT CARD at check-in to the Emergency Department and triage nurse.   

## 2018-06-11 ENCOUNTER — Telehealth: Payer: Self-pay | Admitting: Hematology

## 2018-06-11 NOTE — Telephone Encounter (Signed)
Appointments already scheduled per 7/3 los

## 2018-06-16 ENCOUNTER — Inpatient Hospital Stay: Payer: Medicare Other

## 2018-06-16 ENCOUNTER — Telehealth: Payer: Self-pay

## 2018-06-16 ENCOUNTER — Other Ambulatory Visit: Payer: Self-pay

## 2018-06-16 ENCOUNTER — Inpatient Hospital Stay: Payer: Medicare Other | Admitting: Nutrition

## 2018-06-16 ENCOUNTER — Other Ambulatory Visit: Payer: Self-pay | Admitting: Hematology

## 2018-06-16 VITALS — BP 127/79 | HR 99 | Temp 99.2°F | Resp 18 | Wt 170.0 lb

## 2018-06-16 DIAGNOSIS — N189 Chronic kidney disease, unspecified: Secondary | ICD-10-CM | POA: Diagnosis not present

## 2018-06-16 DIAGNOSIS — C771 Secondary and unspecified malignant neoplasm of intrathoracic lymph nodes: Secondary | ICD-10-CM | POA: Diagnosis not present

## 2018-06-16 DIAGNOSIS — C782 Secondary malignant neoplasm of pleura: Secondary | ICD-10-CM | POA: Diagnosis not present

## 2018-06-16 DIAGNOSIS — C801 Malignant (primary) neoplasm, unspecified: Secondary | ICD-10-CM

## 2018-06-16 DIAGNOSIS — Z5111 Encounter for antineoplastic chemotherapy: Secondary | ICD-10-CM | POA: Diagnosis not present

## 2018-06-16 DIAGNOSIS — C7800 Secondary malignant neoplasm of unspecified lung: Secondary | ICD-10-CM

## 2018-06-16 DIAGNOSIS — Z7189 Other specified counseling: Secondary | ICD-10-CM

## 2018-06-16 DIAGNOSIS — M549 Dorsalgia, unspecified: Secondary | ICD-10-CM | POA: Diagnosis not present

## 2018-06-16 DIAGNOSIS — C679 Malignant neoplasm of bladder, unspecified: Secondary | ICD-10-CM | POA: Diagnosis not present

## 2018-06-16 DIAGNOSIS — R05 Cough: Secondary | ICD-10-CM | POA: Diagnosis not present

## 2018-06-16 DIAGNOSIS — C799 Secondary malignant neoplasm of unspecified site: Secondary | ICD-10-CM

## 2018-06-16 DIAGNOSIS — C7910 Secondary malignant neoplasm of unspecified urinary organs: Secondary | ICD-10-CM

## 2018-06-16 LAB — CMP (CANCER CENTER ONLY)
ALT: 109 U/L — ABNORMAL HIGH (ref 0–44)
AST: 99 U/L — ABNORMAL HIGH (ref 15–41)
Albumin: 2.8 g/dL — ABNORMAL LOW (ref 3.5–5.0)
Alkaline Phosphatase: 183 U/L — ABNORMAL HIGH (ref 38–126)
Anion gap: 13 (ref 5–15)
BUN: 20 mg/dL (ref 8–23)
CO2: 26 mmol/L (ref 22–32)
Calcium: 9.4 mg/dL (ref 8.9–10.3)
Chloride: 101 mmol/L (ref 98–111)
Creatinine: 1.51 mg/dL — ABNORMAL HIGH (ref 0.61–1.24)
GFR, Est AFR Am: 52 mL/min — ABNORMAL LOW (ref >60)
GFR, Estimated: 45 mL/min — ABNORMAL LOW (ref >60)
Glucose, Bld: 133 mg/dL — ABNORMAL HIGH (ref 70–99)
Potassium: 4.5 mmol/L (ref 3.5–5.1)
Sodium: 140 mmol/L (ref 135–145)
Total Bilirubin: 0.5 mg/dL (ref 0.3–1.2)
Total Protein: 7.5 g/dL (ref 6.5–8.1)

## 2018-06-16 LAB — MAGNESIUM: Magnesium: 2.3 mg/dL (ref 1.7–2.4)

## 2018-06-16 LAB — CBC WITH DIFFERENTIAL/PLATELET
Basophils Absolute: 0 10*3/uL (ref 0.0–0.1)
Basophils Relative: 0 %
Eosinophils Absolute: 0 10*3/uL (ref 0.0–0.5)
Eosinophils Relative: 0 %
HCT: 28.4 % — ABNORMAL LOW (ref 38.4–49.9)
Hemoglobin: 8.9 g/dL — ABNORMAL LOW (ref 13.0–17.1)
Lymphocytes Relative: 10 %
Lymphs Abs: 1.3 10*3/uL (ref 0.9–3.3)
MCH: 24.6 pg — ABNORMAL LOW (ref 27.2–33.4)
MCHC: 31.3 g/dL — ABNORMAL LOW (ref 32.0–36.0)
MCV: 78.5 fL — ABNORMAL LOW (ref 79.3–98.0)
Monocytes Absolute: 1.9 10*3/uL — ABNORMAL HIGH (ref 0.1–0.9)
Monocytes Relative: 15 %
Neutro Abs: 9.6 10*3/uL — ABNORMAL HIGH (ref 1.5–6.5)
Neutrophils Relative %: 75 %
Platelets: 253 10*3/uL (ref 140–400)
RBC: 3.62 MIL/uL — ABNORMAL LOW (ref 4.20–5.82)
RDW: 17.5 % — ABNORMAL HIGH (ref 11.0–14.6)
WBC: 12.8 10*3/uL — ABNORMAL HIGH (ref 4.0–10.3)

## 2018-06-16 LAB — PHOSPHORUS: Phosphorus: 2.7 mg/dL (ref 2.5–4.6)

## 2018-06-16 MED ORDER — ALTEPLASE 2 MG IJ SOLR
2.0000 mg | Freq: Once | INTRAMUSCULAR | Status: AC | PRN
  Administered 2018-06-16: 2 mg
  Filled 2018-06-16: qty 2

## 2018-06-16 MED ORDER — OXYCODONE HCL 5 MG PO TABS
5.0000 mg | ORAL_TABLET | Freq: Once | ORAL | Status: DC
  Filled 2018-06-16: qty 1

## 2018-06-16 MED ORDER — PEGFILGRASTIM-CBQV 6 MG/0.6ML ~~LOC~~ SOSY
6.0000 mg | PREFILLED_SYRINGE | Freq: Once | SUBCUTANEOUS | Status: AC
  Administered 2018-06-16: 6 mg via SUBCUTANEOUS

## 2018-06-16 MED ORDER — OXYCODONE-ACETAMINOPHEN 5-325 MG PO TABS
1.0000 | ORAL_TABLET | Freq: Once | ORAL | Status: AC
  Administered 2018-06-16: 1 via ORAL

## 2018-06-16 MED ORDER — PEGFILGRASTIM-CBQV 6 MG/0.6ML ~~LOC~~ SOSY
PREFILLED_SYRINGE | SUBCUTANEOUS | Status: AC
  Filled 2018-06-16: qty 0.6

## 2018-06-16 MED ORDER — SODIUM CHLORIDE 0.9 % IV SOLN
Freq: Once | INTRAVENOUS | Status: AC
  Administered 2018-06-16: 11:00:00 via INTRAVENOUS

## 2018-06-16 MED ORDER — ALTEPLASE 2 MG IJ SOLR
INTRAMUSCULAR | Status: AC
  Filled 2018-06-16: qty 2

## 2018-06-16 MED ORDER — OXYCODONE-ACETAMINOPHEN 5-325 MG PO TABS
ORAL_TABLET | ORAL | Status: AC
  Filled 2018-06-16: qty 1

## 2018-06-16 NOTE — Patient Instructions (Signed)
Dehydration, Adult Dehydration is when there is not enough fluid or water in your body. This happens when you lose more fluids than you take in. Dehydration can range from mild to very bad. It should be treated right away to keep it from getting very bad. Symptoms of mild dehydration may include:  Thirst.  Dry lips.  Slightly dry mouth.  Dry, warm skin.  Dizziness. Symptoms of moderate dehydration may include:  Very dry mouth.  Muscle cramps.  Dark pee (urine). Pee may be the color of tea.  Your body making less pee.  Your eyes making fewer tears.  Heartbeat that is uneven or faster than normal (palpitations).  Headache.  Light-headedness, especially when you stand up from sitting.  Fainting (syncope). Symptoms of very bad dehydration may include:  Changes in skin, such as: ? Cold and clammy skin. ? Blotchy (mottled) or pale skin. ? Skin that does not quickly return to normal after being lightly pinched and let go (poor skin turgor).  Changes in body fluids, such as: ? Feeling very thirsty. ? Your eyes making fewer tears. ? Not sweating when body temperature is high, such as in hot weather. ? Your body making very little pee.  Changes in vital signs, such as: ? Weak pulse. ? Pulse that is more than 100 beats a minute when you are sitting still. ? Fast breathing. ? Low blood pressure.  Other changes, such as: ? Sunken eyes. ? Cold hands and feet. ? Confusion. ? Lack of energy (lethargy). ? Trouble waking up from sleep. ? Short-term weight loss. ? Unconsciousness. Follow these instructions at home:  If told by your doctor, drink an ORS: ? Make an ORS by using instructions on the package. ? Start by drinking small amounts, about  cup (120 mL) every 5-10 minutes. ? Slowly drink more until you have had the amount that your doctor said to have.  Drink enough clear fluid to keep your pee clear or pale yellow. If you were told to drink an ORS, finish the ORS  first, then start slowly drinking clear fluids. Drink fluids such as: ? Water. Do not drink only water by itself. Doing that can make the salt (sodium) level in your body get too low (hyponatremia). ? Ice chips. ? Fruit juice that you have added water to (diluted). ? Low-calorie sports drinks.  Avoid: ? Alcohol. ? Drinks that have a lot of sugar. These include high-calorie sports drinks, fruit juice that does not have water added, and soda. ? Caffeine. ? Foods that are greasy or have a lot of fat or sugar.  Take over-the-counter and prescription medicines only as told by your doctor.  Do not take salt tablets. Doing that can make the salt level in your body get too high (hypernatremia).  Eat foods that have minerals (electrolytes). Examples include bananas, oranges, potatoes, tomatoes, and spinach.  Keep all follow-up visits as told by your doctor. This is important. Contact a doctor if:  You have belly (abdominal) pain that: ? Gets worse. ? Stays in one area (localizes).  You have a rash.  You have a stiff neck.  You get angry or annoyed more easily than normal (irritability).  You are more sleepy than normal.  You have a harder time waking up than normal.  You feel: ? Weak. ? Dizzy. ? Very thirsty.  You have peed (urinated) only a small amount of very dark pee during 6-8 hours. Get help right away if:  You have symptoms of   very bad dehydration.  You cannot drink fluids without throwing up (vomiting).  Your symptoms get worse with treatment.  You have a fever.  You have a very bad headache.  You are throwing up or having watery poop (diarrhea) and it: ? Gets worse. ? Does not go away.  You have blood or something green (bile) in your throw-up.  You have blood in your poop (stool). This may cause poop to look black and tarry.  You have not peed in 6-8 hours.  You pass out (faint).  Your heart rate when you are sitting still is more than 100 beats a  minute.  You have trouble breathing. This information is not intended to replace advice given to you by your health care provider. Make sure you discuss any questions you have with your health care provider. Document Released: 09/20/2009 Document Revised: 06/13/2016 Document Reviewed: 01/18/2016 Elsevier Interactive Patient Education  2018 [employer]  

## 2018-06-16 NOTE — Progress Notes (Signed)
When accessing patient there was no blood return.  After multiple maneuvers there was still no blood return.  Cath Flo was given at 0855.  30 min later there was still no blood return.  Will wait another 30 min and will check again.

## 2018-06-16 NOTE — Telephone Encounter (Signed)
Per Dr. Irene Limbo hold treatment today. No Gemzar. Per Dr. Irene Limbo, patient to receive Neulasta shot today instead of tomorrow. Jesse Fall, RN in infusion made aware.

## 2018-06-16 NOTE — Progress Notes (Signed)
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 Weight documented as 170 pounds July 10 decreased from 171 pounds July 3. Patient reports he is trying to eat more food.  He is drinking boost plus twice a day. He continues to sleep a lot and knows he needs to get up and be more active. Reports gas and constipation are improving.  Nutrition diagnosis: Unintended weight loss continues. Severe malnutrition continues.  Intervention: Educated patient to increase boost plus 3 times daily between meals. Provided coupons. Encourage patient to eat at least 3 meals daily and try to increase physical activity as tolerated. Teach back method used.  Monitoring, evaluation, goals, Patient will work to increase calories and protein to minimize weight loss.  Next visit: Wednesday, August 14 during infusion.  **Disclaimer: This note was dictated with voice recognition software. Similar sounding words can inadvertently be transcribed and this note may contain transcription errors which may not have been corrected upon publication of note.**

## 2018-06-17 ENCOUNTER — Inpatient Hospital Stay: Payer: Medicare Other

## 2018-06-18 ENCOUNTER — Other Ambulatory Visit: Payer: Self-pay | Admitting: Family Medicine

## 2018-06-18 ENCOUNTER — Other Ambulatory Visit: Payer: Self-pay | Admitting: Hematology

## 2018-06-18 DIAGNOSIS — C679 Malignant neoplasm of bladder, unspecified: Secondary | ICD-10-CM

## 2018-06-18 DIAGNOSIS — Z95828 Presence of other vascular implants and grafts: Secondary | ICD-10-CM

## 2018-06-21 ENCOUNTER — Telehealth: Payer: Self-pay

## 2018-06-21 NOTE — Telephone Encounter (Signed)
Spoke to Tiffany in IR to try and schedule port repair for patient. Tiffany stated a radiologist will review the patient's chart and then she will call the patient to schedule an appointment. Spoke to patient and made him aware that someone from IR will be contacting him in the next few days to schedule an appointment regarding his port. Patient verbalized understanding.

## 2018-06-22 ENCOUNTER — Other Ambulatory Visit (HOSPITAL_COMMUNITY): Payer: Self-pay | Admitting: Interventional Radiology

## 2018-06-22 DIAGNOSIS — C679 Malignant neoplasm of bladder, unspecified: Secondary | ICD-10-CM

## 2018-06-22 DIAGNOSIS — Z95828 Presence of other vascular implants and grafts: Secondary | ICD-10-CM

## 2018-06-22 NOTE — Progress Notes (Signed)
HEMATOLOGY/ONCOLOGY CLINIC NOTE  Date of Service: 06/30/2018  Patient Care Team: Martinique, Betty G, MD as PCP - General (Family Medicine)  Raynelle Bring, MD as Urologist  CHIEF COMPLAINTS/PURPOSE OF CONSULTATION:  F/u for newly diagnosed Metastatic Urothelial bladder carcinoma   HISTORY OF PRESENTING ILLNESS:   Jeremy Patrick is a wonderful 72 y.o. male who has been referred to [place] by Dr Betty Martinique for evaluation and management of metastatic malignancy of unknown primary. He is accompanied today by his wife. The pt reports that he is doing well overall.   The pt reports that he first noticed hematuria two years ago. He has had multiple bladder tumors removed since then, with the last resection in February 2019. He denies knowledge of metastasis and has been followed by urology and hasn't been seen by medical oncology previously.  He notes that his hematuria has resolved. His last treatment with intravesical Mitomycin C was 2-3 months ago, soon after his last tumor resection.   He presented to the ED on 04/26/18 because he was having intractable coughing. He notes that he would cough so much that he began to throw up. He denies coughing up any blood and has brought up some clear phlegm. He first noticed his cough one month ago, and it worsened to the point that he presented to the ED a couple days ago.  He does endorse feeling sleepier in the last month, as he wasn't eating as well- which he describes as a change in the last month. He notes that he has lost 20-30 pounds in the last 2-3 months after beginning Mytomycin C. He felt chills and flu-like symptoms for 2 weeks after beginning Mitomycin C.   Of note prior to the patient's visit today, pt has had CT C/A/P completed on 04/26/18 in the ED with results revealing 1. Bilateral pulmonary nodules and masses, right greater than left are associated with right-sided pleural disease and small right pleural effusion. Metastatic disease in this  patient with history of bladder cancer would be a distinct consideration although thoracic primary not excluded. 2. Lower paraesophageal lymphadenopathy 3. Mild circumferential bladder wall thickening with a 10 mm apparent bladder wall nodule posteriorly on the left. 4. No evidence for metastatic disease in the abdomen or pelvis. 5. Cholelithiasis. 6.  Aortic Atherosclerois.   Most recent lab results (04/26/18) of CBC  is as follows: all values are WNL except for Hgb at 10.7, HCT at 36.4, MCH at 24.3, MCHC at 29.4, Monocytes Abs at 1.3k.  On review of systems, pt reports decreased appetite, weight loss, increased fatigue, productive cough, mild back pains, and denies back aches, head aches, fevers, chills, night sweats, changes in vision, difficulty swallowing, and any other symptoms.   On PMHx the pt reports chronic kidney disease, bladder cancer, hypertension, GERD. On Social Hx the pt reports frequent exposure to clorox and he works in Development worker, international aid.    INTERVAL HISTORY   Jeremy Patrick is here for follow up regarding his metastatic urothelial carcinoma and C3D1 of his Gemcitabine/Carboplatin treatment. The patient's last visit with [place] was on 06/09/18. He is accompanied today by his fiance, daughter and son. The pt reports that he is doing well overall.   The pt reports that his appetite has improved a little in the interim. He has experienced less coughing and less phlegm as well. He has been noticing continuing fatigue when walking up stairs and moving around his house.   He notes that he  consumed two Boosts yesterday and saw our nutritional therapist Ernestene Kiel last week.   The pt notes that he has had some difficulty sleeping at night, and has found himself napping more in the day time.   Lab results today (06/30/18) of CBC w/diff, CMP is as follows: all values are WNL except for RBC at 3.53, HGB at 8.6, HCT at 28.6, MCH at 24.4, MCHC at 30.1, RDW at 20.6, ANC at 7.3k,  Monocytes abs at 1.2k, Glucose at 109, Albumin at 2.7. Magnesium 06/30/18 is WNL at 2.0  On review of systems, pt reports improved cough, some fatigue, mild appetite improvement, weight loss, moving his bowels well, and denies nausea, diarrhea, mouth sores, abdominal pains, blood in the urine, and any other symptoms.    MEDICAL HISTORY:  Past Medical History:  Diagnosis Date  . Cancer [facility])    bladder cancer  . GERD (gastroesophageal reflux disease)   . Hypertension     SURGICAL HISTORY: Past Surgical History:  Procedure Laterality Date  . BLADDER SURGERY    . CYSTOSCOPY N/A 04/06/2017   Procedure: CYSTOSCOPY;  Surgeon: Raynelle Bring, MD;  Location: [facility];  Service: Urology;  Laterality: N/A;  . CYSTOSCOPY W/ RETROGRADES N/A 01/28/2018   Procedure: CYSTOSCOPY WITH RETROGRADE PYELOGRAM;  Surgeon: Raynelle Bring, MD;  Location: [facility];  Service: Urology;  Laterality: N/A;  . CYSTOSCOPY W/ URETERAL STENT PLACEMENT N/A 10/08/2017   Procedure: CYSTOSCOPY WITH BILATERAL  RETROGRADE PYELOGRAM/ LEFT URETERAL STENT PLACEMENT;  Surgeon: Raynelle Bring, MD;  Location: [facility];  Service: Urology;  Laterality: N/A;  . IR CV LINE INJECTION  06/23/2018  . IR IMAGING GUIDED PORT INSERTION  05/21/2018  . IR REMOVAL TUN ACCESS W/ PORT W/O FL MOD SED  06/24/2018  . TRANSURETHRAL RESECTION OF BLADDER TUMOR N/A 04/06/2017   Procedure: TRANSURETHRAL RESECTION OF BLADDER TUMOR (TURBT);  Surgeon: Raynelle Bring, MD;  Location: [facility];  Service: Urology;  Laterality: N/A;  . TRANSURETHRAL RESECTION OF BLADDER TUMOR N/A 10/08/2017   Procedure: TRANSURETHRAL RESECTION OF BLADDER TUMOR (TURBT);  Surgeon: Raynelle Bring, MD;  Location: [facility];  Service: Urology;  Laterality: N/A;  . TRANSURETHRAL RESECTION OF BLADDER TUMOR N/A 01/28/2018   Procedure: TRANSURETHRAL RESECTION OF BLADDER TUMOR (TURBT);  Surgeon: Raynelle Bring, MD;  Location: [facility];  Service: Urology;  Laterality: N/A;  GENERAL ANESTHESIA WITH PARALYSIS  .  TRANSURETHRAL RESECTION OF BLADDER TUMOR WITH MITOMYCIN-C N/A 03/02/2017   Procedure: TRANSURETHRAL RESECTION OF BLADDER TUMOR/ EXAM UNDER ANESTHESIA/ WITH MITOMYCIN-C POST OPERATIVE;  Surgeon: Raynelle Bring, MD;  Location: [facility];  Service: Urology;  Laterality: N/A;    SOCIAL HISTORY: Social History   Socioeconomic History  . Marital status: Married    Spouse name: Not on file  . Number of children: 4  . Years of education: 10  . Highest education level: Not on file  Occupational History  . Occupation: Retired  Scientific laboratory technician  . Financial resource strain: Not on file  . Food insecurity:    Worry: Not on file    Inability: Not on file  . Transportation needs:    Medical: Not on file    Non-medical: Not on file  Tobacco Use  . Smoking status: Former Smoker    Packs/day: 0.15    Years: 10.00    Pack years: 1.50    Types: Cigarettes    Last attempt to quit: 1980    Years since quitting: 39.5  . Smokeless tobacco: Never Used  Substance and Sexual  Activity  . Alcohol use: Not Currently  . Drug use: No    Comment: "years ago"  . Sexual activity: Not on file  Lifestyle  . Physical activity:    Days per week: Not on file    Minutes per session: Not on file  . Stress: Not on file  Relationships  . Social connections:    Talks on phone: Not on file    Gets together: Not on file    Attends religious service: Not on file    Active member of club or organization: Not on file    Attends meetings of clubs or organizations: Not on file    Relationship status: Not on file  . Intimate partner violence:    Fear of current or ex partner: Not on file    Emotionally abused: Not on file    Physically abused: Not on file    Forced sexual activity: Not on file  Other Topics Concern  . Not on file  Social History Narrative   Fun/Hobby: Go to chur[employer] and travel.     FAMILY HISTORY: Family History  Problem Relation Age of Onset  . Hypertension Mother   . Stroke Mother   . Diabetes  Mother   . Cataracts Mother   . Kidney disease Mother   . Hypertension Father   . Stroke Father   . Aneurysm Maternal Grandmother     ALLERGIES:  has No Known Allergies.  MEDICATIONS:  Current Outpatient Medications  Medication Sig Dispense Refill  . acetaminophen (TYLENOL) 500 MG tablet Take 1,000 mg by mouth daily as needed for moderate pain.     . benzonatate (TESSALON) 100 MG capsule Take 1 capsule (100 mg total) by mouth 3 (three) times daily as needed for cough. 30 capsule 0  . cefpodoxime (VANTIN) 100 MG tablet Take 1 tablet (100 mg total) by mouth 2 (two) times daily. (Patient not taking: Reported on 06/23/2018) 10 tablet 0  . Cyanocobalamin (VITAMIN B 12 PO) Take 1 tablet by mouth daily.    Marland Kitchen dexamethasone (DECADRON) 4 MG tablet Take 2 tablets (8 mg total) by mouth daily. Start the day after carboplatin chemotherapy for 2 days. 30 tablet 1  . diltiazem (CARDIZEM CD) 360 MG 24 hr capsule TAKE 1 CAPSULE BY MOUTH  DAILY. 90 capsule 3  . dronabinol (MARINOL) 5 MG capsule Take 1 capsule (5 mg total) by mouth 2 (two) times daily before a meal. 60 capsule 0  . enoxaparin (LOVENOX) 80 MG/0.8ML injection Inject 0.8 mLs (80 mg total) into the skin every 12 (twelve) hours. Start taking 06/24/2018 at 6pm 60 Syringe 2  . Ferrous Gluconate (IRON 27 PO) Take 27 mg by mouth daily.    . Glucos-Chondroit-Hyaluron-MSM (GLUCOSAMINE CHONDROITIN JOINT PO) Take 1,500 mg by mouth daily.    Marland Kitchen guaiFENesin-dextromethorphan (ROBITUSSIN DM) 100-10 MG/5ML syrup Take 5 mLs by mouth 3 (three) times daily as needed for cough. (Patient not taking: Reported on 05/06/2018) 118 mL 0  . HYDROcodone-acetaminophen (NORCO/VICODIN) 5-325 MG tablet Take 1 tablet by mouth every 6 (six) hours as needed for moderate pain or severe pain.     Marland Kitchen lidocaine-prilocaine (EMLA) cream Apply to affected area once 30 g 3  . LORazepam (ATIVAN) 0.5 MG tablet Take 1 tablet (0.5 mg total) by mouth every 6 (six) hours as needed (Nausea or  vomiting). 30 tablet 0  . ondansetron (ZOFRAN) 8 MG tablet Take 1 tablet (8 mg total) by mouth 2 (two) times daily as needed for  refractory nausea / vomiting. Start on day 3 after carboplatin chemo. 30 tablet 1  . ondansetron (ZOFRAN-ODT) 4 MG disintegrating tablet Take 1 tablet (4 mg total) by mouth every 8 (eight) hours as needed for nausea or vomiting. 30 tablet 0  . phenazopyridine (PYRIDIUM) 200 MG tablet Take 1 tablet (200 mg total) by mouth 3 (three) times daily. (Patient not taking: Reported on 06/23/2018) 6 tablet 0  . prochlorperazine (COMPAZINE) 10 MG tablet Take 1 tablet (10 mg total) by mouth every 6 (six) hours as needed (Nausea or vomiting). 30 tablet 1  . ranitidine (ZANTAC) 300 MG tablet TAKE 1 TABLET BY MOUTH  EVERY MORNING. 90 tablet 3  . senna-docusate (SENNA S) 8.6-50 MG tablet Take 2 tablets by mouth at bedtime. 60 tablet 1  . sildenafil (VIAGRA) 50 MG tablet Take 1 tablet (50 mg total) by mouth daily as needed for erectile dysfunction. (Patient not taking: Reported on 05/29/2018) 20 tablet 3   No current facility-administered medications for this visit.     REVIEW OF SYSTEMS:  A 10+ POINT REVIEW OF SYSTEMS WAS OBTAINED including neurology, dermatology, psychiatry, cardiac, respiratory, lymph, extremities, GI, GU, Musculoskeletal, constitutional, breasts, reproductive, HEENT.  All pertinent positives are noted in the HPI.  All others are negative.   PHYSICAL EXAMINATION: ECOG PERFORMANCE STATUS: 1 - Symptomatic but completely ambulatory  VS reviewed GENERAL:alert, in no acute distress and comfortable SKIN: no acute rashes, no significant lesions EYES: conjunctiva are pink and non-injected, sclera anicteric OROPHARYNX: MMM, no exudates, no oropharyngeal erythema or ulceration NECK: supple, no JVD LYMPH:  no palpable lymphadenopathy in the cervical, axillary or inguinal regions LUNGS: clear to auscultation b/l with normal respiratory effort HEART: regular rate &  rhythm ABDOMEN:  normoactive bowel sounds , non tender, not distended. No palpable hepatosplenomegaly.  Extremity: no pedal edema PSYCH: alert & oriented x 3 with fluent speech NEURO: no focal motor/sensory deficits   LABORATORY DATA:  I have reviewed the data as listed  . CBC Latest Ref Rng & Units 06/30/2018 06/24/2018 06/23/2018  WBC 4.0 - 10.3 K/uL 9.8 19.1(H) 21.5(H)  Hemoglobin 13.0 - 17.1 g/dL 8.6(L) 8.3(L) 8.3(L)  Hematocrit 38.4 - 49.9 % 28.6(L) 26.7(L) 27.7(L)  Platelets 140 - 400 K/uL 330 412(H) 450(H)    . CMP Latest Ref Rng & Units 06/30/2018 06/23/2018 06/16/2018  Glucose 70 - 99 mg/dL 109(H) 117(H) 133(H)  BUN 8 - 23 mg/dL 14 22 20   Creatinine 0.61 - 1.24 mg/dL 0.99 1.39(H) 1.51(H)  Sodium 135 - 145 mmol/L 139 137 140  Potassium 3.5 - 5.1 mmol/L 4.1 4.1 4.5  Chloride 98 - 111 mmol/L 102 102 101  CO2 22 - 32 mmol/L 24 23 26   Calcium 8.9 - 10.3 mg/dL 9.5 8.8(L) 9.4  Total Protein 6.5 - 8.1 g/dL 7.6 7.2 7.5  Total Bilirubin 0.3 - 1.2 mg/dL 0.3 0.4 0.5  Alkaline Phos 38 - 126 U/L 109 184(H) 183(H)  AST 15 - 41 U/L 29 70(H) 99(H)  ALT 0 - 44 U/L 21 74(H) 109(H)    PATHOLOGY  DIAGNOSIS:  05/06/18 A. PLEURAL MASS, RIGHT; CT-GUIDED BIOPSY:  - METASTATIC HIGH-GRADE UROTHELIAL CARCINOMA. Comment:  The patient has a history of invasive high-grade urothelial carcinoma with glandular (10%) and small cell differentiation (no more than 5%) diagnosed in a TURBT specimen of the left bladder neck from March 2018 at an outside hospital.  Given the patient's history a limited panel of immunohistochemical  stains was performed with the following pattern of immunoreactivity:  GATA-3: Positive  CD56: Negative  TTF-1: Negative  Napsin: Negative  The immunohistochemical and morphologic findings are consistent with metastatic high-grade urothelial carcinoma. The slides from the outside  prior TURBT ([id number]) were requested and reviewed in conjunction with this case. While a glandular  and small cell component was identified in the 2018 specimen those morphologic patterns are not identified in the current sample.  These findings were communicated to Dr. Irene Limbo via [facility] in basket on 05/10/2018. IHC slides were prepared by [facility], [place], [place]. All controls stained appropriately. This test was developed and its performance characteristics determined  by [facility]. It has not been cleared or approved by the [place] Food and Drug  Administration. The FDA does not require this test to go through  premarket FDA review. This test is used for clinical purposes. It should  not be regarded as investigational or for research. This laboratory is  certified under the Clinical Laboratory Improvement Amendments (CLIA) as  qualified to perform high complexity clinical laboratory testing.    01/28/18 Bx:    03/02/17 Bx:    CV Procedures:  06/23/18 [place] Vas Upper Extremities:     RADIOGRAPHIC STUDIES: I have personally reviewed the radiological images as listed and agreed with the findings in the report. Dg Chest 2 View  Result Date: 06/23/2018 CLINICAL DATA:  Fever and cough EXAM: CHEST - 2 VIEW COMPARISON:  04/26/2018 CXR and CT FINDINGS: New moderate right pleural effusion partially obscuring a dominant mass in the right lower lobe as well as the right hemidiaphragm and heart border. Previously noted pulmonary masses are redemonstrated though slightly more prominent in appearance. Right IJ central line catheter is seen in the proximal SVC. No frank bone destruction is identified. IMPRESSION: New moderate right pleural effusion partially obscuring a dominant mass in the right lower lobe as well as the right hemidiaphragm and heart border. Subjectively, the previously noted bilateral pulmonary masses and nodules appear more prominent/larger in size. Electronically Signed   By: Ashley Royalty M.D.   On: 06/23/2018 22:20   Ir Removal [employer] W/ [place] W/o Fl Mod  Sed  Result Date: 06/24/2018 CLINICAL DATA:  History of metastatic urothelial carcinoma with prior port placement on 05/21/2018. The port catheter has migrated superiorly into the right internal jugular vein with associated jugular thrombosis and probable occlusion at the base of the jugular vein. The port now requires removal. EXAM: REMOVAL OF IMPLANTED TUNNELED PORT-A-CATH MEDICATIONS: 2 g IV Ancef. The antibiotic was administered within 1 hour prior to the start of the procedure. Moderate (conscious) sedation was employed during this procedure. A total of Versed 2.0 mg and Fentanyl 100 mcg was administered intravenously. Moderate Sedation Time: 21 minutes. The patient's level of consciousness and vital signs were monitored continuously by radiology nursing throughout the procedure under my direct supervision. PROCEDURE: The right chest Port-A-Cath site was prepped with chlorhexidine. A sterile gown and gloves were worn during the procedure. Local anesthesia was provided with 1% lidocaine. An incision was made overlying the Port-A-Cath with a #15 scalpel. Utilizing sharp and blunt dissection, the Port-A-Cath was removed. Portable cautery was utilized. Retention sutures were removed. The pocked was irrigated with sterile saline. Wound closure was performed with subcutaneous 3-0 Monocryl, subcuticular 4-0 Vicryl and Dermabond. The entire Port-A-Cath was removed successfully. IMPRESSION: Removal of implanted Port-A-Cath utilizing sharp and blunt dissection. The procedure was uncomplicated. Electronically Signed   By: Aletta Edouard M.D.   On: 06/24/2018 13:32  Ir Cv Line Injection  Result Date: 06/23/2018 INDICATION: 72 year old with metastatic urothelial cancer. Port-A-Cath was placed on 05/21/2018. Patient is complaining of right neck pain. EXAM: EVALUATION OF THE RIGHT CHEST PORT-A-CATH WITH LINE INJECTION Physician: Stephan Minister. Anselm Pancoast, MD FLUOROSCOPY TIME:  6 seconds, 24 mGy MEDICATIONS: Lovenox 80 mg  subcutaneous CONTRAST:  3 mL Isovue-300 ANESTHESIA/SEDATION: None PROCEDURE: The right chest port was accessed using sterile technique. A small amount of blood was able to be aspirated. Fluoroscopy demonstrated that the catheter is now malpositioned within the right neck. Contrast injection demonstrated irregular filling of the right jugular vein. Ultrasound demonstrated thrombosis of the right jugular vein. Catheter was flushed with a small amount of heparinized saline. The port access needle was removed. FINDINGS: Catheter has flipped up into the right jugular vein. Previously, the catheter was well positioned at the superior cavoatrial junction. In addition, there is thrombus within the right jugular vein. Inferior aspect of the right jugular vein appears to be occluded. COMPLICATIONS: None IMPRESSION: Port-A-Cath is malpositioned. The catheter is coiled within the right jugular vein. In addition, the right jugular vein contains thrombus. These findings were discussed with Dr. Irene Limbo. Patient was given a dose of subcutaneous Lovenox and was taken to vascular ultrasound for evaluation of additional DVT in the upper extremities. Patient is scheduled to return to [facility] on 06/24/2018 for removal of the right jugular Port-A-Cath and possible placement of a left-sided chest port. Electronically Signed   By: Markus Daft M.D.   On: 06/23/2018 17:20    ASSESSMENT & PLAN:  72 y.o. male with  1. Recently diagnosed Urothelial bladder Carcinoma metastatic to the lung/thoracic LN and rt pleura, Stage IV  h/o recurrent bladder cancer with one specimen showing possible focal muscularis invasion. 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   -04/26/18 CT C/A/P which revealed Bilateral pulmonary nodules and masses, right greater than left are associated with right-sided pleural disease and small right pleural  effusion.  -CXR 02/27/17 did not reveal any lung masses -Pathology from March 2018 showed a small cell component and invasion into the urinary bladder muscle -PET from 05/10/18 which shows wide spread metastatic disease in the chest, including bilateral pulmonary parenchymal, thoracic nodal, and right-sided pleural disease. No evidence of extrathoracic metastatic disease. -Biopsy from 05/06/18 which shows evidence of metastatic high grade urothelial carcinoma. He has recurrent now high grade metastatic disease to the lung.  MRI brain: No evidence of intracranial metastatic disease  2. Intractable cough from pulmonary metastases -On tessalon perles with some relief  PLAN:  -Discussed pt labwork today, 06/30/18; HGB stable at 8.6 ,ANC at 7.3k, PLT normal at 330k, Albumin at 2.7, kidney and liver functions have normalized -In light of patient's fatigue, offered blood transfusion, pt would like to have this. Will order this up.  -The pt has no prohibitive toxicities from continuing C3D1 of Gemcitabine/Carboplatin at this time.   -Discussed the 06/23/18 Vas [place] Upper Extremity which revealed Evidence of acute DVT in the internal jugular vein, brachial vein and cephalic vein. Ultrasound findings are unable to discriminate whether obstruction in the internal jugular vein is acute or chronic. No evidence of DVT or SVT on left side.  -Continue Lovenox for the next 6-8 weeks, then will consider switching to a different blood thinner -Discussed and emphasized the importance of continuing to optimize his nutritional status, and that Boost is to be used as a  supplement, not a replacement for meals. -Will order PET/CT to be completed after C3 -Will consider the timing and role of immunotherapy after reviewing the impending PET/CT -Continue walking at least 20-30 minutes each day -Offered a referral to PT -Continue staying well hydrated with at least 48-64 oz of water each day -Discussed trying to nap less during  the day to aide night time sleep -Will order Remeron for sleep and appetite stimulation  -Offered a referral to counseling and our chaplain service; the pt would like to wait on this   3. Acute DVT in rt IJV and cephalic vein. Plan -continue Lovenox for now. -will hold off on port replacement. -will transition to NOAC after about 1 month if stable.   Return for C3D8 with labs in 1 week Please schedule C4 of Carboplatin/Gemcitabine PRBC transfusion x 2units in 1-2 days  PET/CT in 18 days RTC with Dr Irene Limbo in 3 weeks with labs with C4D1 of treatment     All of the patients questions were answered with apparent satisfaction. The patient knows to call the clinic with any problems, questions or concerns.  The total time spent in the appt was 35 minutes and more than 50% was on counseling and direct patient cares.   Sullivan Lone MD MS AAHIVMS [facility] Hematology/Oncology Physician [facility]  (Office):       [phone] (Work cell):  [phone] (Fax):           [phone]  06/30/2018 10:48 AM  I, Baldwin Jamaica, am acting as a Education administrator for Dr Irene Limbo.   .I have reviewed the above documentation for accuracy and completeness, and I agree with the above. Brunetta Genera MD

## 2018-06-23 ENCOUNTER — Other Ambulatory Visit: Payer: Self-pay | Admitting: Hematology

## 2018-06-23 ENCOUNTER — Emergency Department (HOSPITAL_COMMUNITY)
Admission: EM | Admit: 2018-06-23 | Discharge: 2018-06-24 | Disposition: A | Discharge status: Home / Self Care | Payer: Medicare Other | Attending: Emergency Medicine | Admitting: Emergency Medicine

## 2018-06-23 ENCOUNTER — Other Ambulatory Visit: Payer: Self-pay

## 2018-06-23 ENCOUNTER — Ambulatory Visit (HOSPITAL_BASED_OUTPATIENT_CLINIC_OR_DEPARTMENT_OTHER)
Admission: RE | Admit: 2018-06-23 | Discharge: 2018-06-23 | Disposition: A | Discharge status: Home / Self Care | Payer: Medicare Other | Source: Ambulatory Visit | Attending: Hematology | Admitting: Hematology

## 2018-06-23 ENCOUNTER — Encounter (HOSPITAL_COMMUNITY): Payer: Self-pay | Admitting: Diagnostic Radiology

## 2018-06-23 ENCOUNTER — Emergency Department (HOSPITAL_COMMUNITY): Payer: Medicare Other

## 2018-06-23 ENCOUNTER — Ambulatory Visit (HOSPITAL_COMMUNITY)
Admission: RE | Admit: 2018-06-23 | Discharge: 2018-06-23 | Disposition: A | Discharge status: Home / Self Care | Payer: Medicare Other | Source: Ambulatory Visit | Attending: Interventional Radiology | Admitting: Interventional Radiology

## 2018-06-23 ENCOUNTER — Telehealth: Payer: Self-pay | Admitting: *Deleted

## 2018-06-23 DIAGNOSIS — T82524A Displacement of infusion catheter, initial encounter: Secondary | ICD-10-CM | POA: Insufficient documentation

## 2018-06-23 DIAGNOSIS — I10 Essential (primary) hypertension: Secondary | ICD-10-CM | POA: Diagnosis not present

## 2018-06-23 DIAGNOSIS — Z95828 Presence of other vascular implants and grafts: Secondary | ICD-10-CM

## 2018-06-23 DIAGNOSIS — R509 Fever, unspecified: Secondary | ICD-10-CM | POA: Insufficient documentation

## 2018-06-23 DIAGNOSIS — Y712 Prosthetic and other implants, materials and accessory cardiovascular devices associated with adverse incidents: Secondary | ICD-10-CM

## 2018-06-23 DIAGNOSIS — Z7901 Long term (current) use of anticoagulants: Secondary | ICD-10-CM | POA: Diagnosis not present

## 2018-06-23 DIAGNOSIS — I82629 Acute embolism and thrombosis of deep veins of unspecified upper extremity: Secondary | ICD-10-CM

## 2018-06-23 DIAGNOSIS — Z9889 Other specified postprocedural states: Secondary | ICD-10-CM | POA: Diagnosis not present

## 2018-06-23 DIAGNOSIS — Z79899 Other long term (current) drug therapy: Secondary | ICD-10-CM | POA: Diagnosis not present

## 2018-06-23 DIAGNOSIS — I82622 Acute embolism and thrombosis of deep veins of left upper extremity: Secondary | ICD-10-CM | POA: Insufficient documentation

## 2018-06-23 DIAGNOSIS — R05 Cough: Secondary | ICD-10-CM | POA: Diagnosis not present

## 2018-06-23 DIAGNOSIS — Z87891 Personal history of nicotine dependence: Secondary | ICD-10-CM | POA: Diagnosis not present

## 2018-06-23 DIAGNOSIS — Z86718 Personal history of other venous thrombosis and embolism: Secondary | ICD-10-CM | POA: Diagnosis not present

## 2018-06-23 DIAGNOSIS — C679 Malignant neoplasm of bladder, unspecified: Secondary | ICD-10-CM

## 2018-06-23 HISTORY — PX: IR CV LINE INJECTION: IMG2294

## 2018-06-23 LAB — COMPREHENSIVE METABOLIC PANEL
ALT: 74 U/L — ABNORMAL HIGH (ref 0–44)
AST: 70 U/L — ABNORMAL HIGH (ref 15–41)
Albumin: 2.7 g/dL — ABNORMAL LOW (ref 3.5–5.0)
Alkaline Phosphatase: 184 U/L — ABNORMAL HIGH (ref 38–126)
Anion gap: 12 (ref 5–15)
BUN: 22 mg/dL (ref 8–23)
CO2: 23 mmol/L (ref 22–32)
Calcium: 8.8 mg/dL — ABNORMAL LOW (ref 8.9–10.3)
Chloride: 102 mmol/L (ref 98–111)
Creatinine, Ser: 1.39 mg/dL — ABNORMAL HIGH (ref 0.61–1.24)
GFR calc Af Amer: 58 mL/min — ABNORMAL LOW (ref >60)
GFR calc non Af Amer: 50 mL/min — ABNORMAL LOW (ref >60)
Glucose, Bld: 117 mg/dL — ABNORMAL HIGH (ref 70–99)
Potassium: 4.1 mmol/L (ref 3.5–5.1)
Sodium: 137 mmol/L (ref 135–145)
Total Bilirubin: 0.4 mg/dL (ref 0.3–1.2)
Total Protein: 7.2 g/dL (ref 6.5–8.1)

## 2018-06-23 LAB — CBC WITH DIFFERENTIAL/PLATELET
Basophils Absolute: 0 10*3/uL (ref 0.0–0.1)
Basophils Relative: 0 %
Eosinophils Absolute: 0 10*3/uL (ref 0.0–0.7)
Eosinophils Relative: 0 %
HCT: 27.7 % — ABNORMAL LOW (ref 39.0–52.0)
Hemoglobin: 8.3 g/dL — ABNORMAL LOW (ref 13.0–17.0)
Lymphocytes Relative: 8 %
Lymphs Abs: 1.8 10*3/uL (ref 0.7–4.0)
MCH: 23.8 pg — ABNORMAL LOW (ref 26.0–34.0)
MCHC: 30 g/dL (ref 30.0–36.0)
MCV: 79.4 fL (ref 78.0–100.0)
Monocytes Absolute: 1.6 10*3/uL — ABNORMAL HIGH (ref 0.1–1.0)
Monocytes Relative: 8 %
Neutro Abs: 18 10*3/uL — ABNORMAL HIGH (ref 1.7–7.7)
Neutrophils Relative %: 84 %
Platelets: 450 10*3/uL — ABNORMAL HIGH (ref 150–400)
RBC: 3.49 MIL/uL — ABNORMAL LOW (ref 4.22–5.81)
RDW: 19.1 % — ABNORMAL HIGH (ref 11.5–15.5)
WBC: 21.5 10*3/uL — ABNORMAL HIGH (ref 4.0–10.5)

## 2018-06-23 LAB — I-STAT CG4 LACTIC ACID, ED: Lactic Acid, Venous: 1.79 mmol/L (ref 0.5–1.9)

## 2018-06-23 MED ORDER — ENOXAPARIN SODIUM 80 MG/0.8ML ~~LOC~~ SOLN: 1.0000 mg/kg | Freq: Once | SUBCUTANEOUS | Status: DC

## 2018-06-23 MED ORDER — ENOXAPARIN SODIUM 80 MG/0.8ML ~~LOC~~ SOLN
80.0000 mg | Freq: Once | SUBCUTANEOUS | Status: AC
  Administered 2018-06-23: 80 mg via SUBCUTANEOUS
  Filled 2018-06-23: qty 0.8

## 2018-06-23 MED ORDER — ENOXAPARIN SODIUM 80 MG/0.8ML ~~LOC~~ SOLN: 80.0000 mg | Freq: Two times a day (BID) | SUBCUTANEOUS | 2 refills | Status: AC

## 2018-06-23 MED ORDER — IOPAMIDOL (ISOVUE-300) INJECTION 61%
50.0000 mL | Freq: Once | INTRAVENOUS | Status: AC | PRN
  Administered 2018-06-23: 3 mL via INTRAVENOUS

## 2018-06-23 MED ORDER — IOPAMIDOL (ISOVUE-300) INJECTION 61%
INTRAVENOUS | Status: AC
  Administered 2018-06-23: 3 mL via INTRAVENOUS
  Filled 2018-06-23: qty 50

## 2018-06-23 MED ORDER — HEPARIN SOD (PORK) LOCK FLUSH 100 UNIT/ML IV SOLN
INTRAVENOUS | Status: AC
  Filled 2018-06-23: qty 5

## 2018-06-23 NOTE — ED Triage Notes (Signed)
Pt presents to ED from home for fever. Pt's family reports that the pt had a fever at home of 106. Pt reports that he feels "fine." -N/V/D. Pt's wife reports that she checks his temp almost everyday and he gets low grade temps after chemo. Pt unable to get chemo yesterday d/t port malfunction. Pt has appointment tomorrow to get new port.

## 2018-06-23 NOTE — Progress Notes (Signed)
  Patient having severe pain in neck due to malpositioned Port and thrombus.  Rx for Norco 5/325 mg given 1 PO q 4 hours prn pain Disp #10 (ten) no refills.  WENDY S BLAIR PA-C 06/23/2018 1:54 PM

## 2018-06-23 NOTE — Telephone Encounter (Signed)
"  Anderson Malta, [place] calling to report patient's request for pain medication.  Dr. Anselm Pancoast has spoken with Dr. Irene Limbo about port-a-cath tip migrating to IJ and positive or blood clot.  Patient is now requesting medication for neck pain now and for home use saying Tylenol is not cutting his neck.  Currently waiting for [place].  Family waiting I'm sure would not mind walking over to pickup prescription."  1:41 pm Provided cell number to reach provider.

## 2018-06-23 NOTE — Progress Notes (Signed)
Bilateral upper extremity venous duplex has been completed. There is evidence of age indeterminate deep vein thrombosis involving the internal jugular vein of the right upper extremity. There is evidence of acute deep vein thrombosis involving the brachial veins of the right upper extremity. There is also evidence of acute superficial vein thrombosis involving the cephalic vein of the right upper extremity. Negative for DVT on the left. Results were given to [facility] in IR.  06/23/18 2:35 PM Carlos Levering RVT

## 2018-06-23 NOTE — ED Provider Notes (Signed)
[place] DEPT Provider Note   CSN: [id number] Arrival date & time: 06/23/18  2040     History   Chief Complaint Chief Complaint  Patient presents with  . Fever    HPI Jeremy Patrick is a 72 y.o. male.  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  Patient is undergoing chemotherapy patient was in the hospital today to get his Port-A-Cath checked.  They noted that his Port-A-Cath was malpositioned.  The catheter was coiled within the right jugular vein  and there was evidence of thrombus in the right jugular.  Patient was given a dose of subcutaneous Lovenox.  Patient states he went home today and his wife felt that he was warm and had a fever.  She took his temperature and it was 106.  Patient was not given any antipyretics.  Patient denied any symptoms.  He has been coughing but that was no more than usual.  He denies any dysuria.  He denies any vomiting or diarrhea. Past Medical History:  Diagnosis Date  . Cancer [facility])    bladder cancer  . GERD (gastroesophageal reflux disease)   . Hypertension     Patient Active Problem List   Diagnosis Date Noted  . Pulmonary metastases ([place]) 05/18/2018  . Counseling regarding advance care planning and goals of care 05/18/2018  . Chronic rhinitis 04/06/2018  . Erectile dysfunction 04/06/2018  . Chronic left shoulder pain 01/21/2018  . Hepatitis C antibody test positive 04/23/2017  . Medicare annual wellness visit, subsequent 03/30/2017  . Routine adult health maintenance 03/30/2017  . Malignant neoplasm of urinary bladder ([place]) 03/30/2017  . Essential hypertension 03/03/2017  . Gastroesophageal reflux disease without esophagitis 03/03/2017    Past Surgical History:  Procedure Laterality Date  . BLADDER SURGERY    . CYSTOSCOPY N/A 04/06/2017   Procedure: CYSTOSCOPY;  Surgeon: Raynelle Bring, MD;  Location: [facility];  Service: Urology;  Laterality: N/A;  .  CYSTOSCOPY W/ RETROGRADES N/A 01/28/2018   Procedure: CYSTOSCOPY WITH RETROGRADE PYELOGRAM;  Surgeon: Raynelle Bring, MD;  Location: [facility];  Service: Urology;  Laterality: N/A;  . CYSTOSCOPY W/ URETERAL STENT PLACEMENT N/A 10/08/2017   Procedure: CYSTOSCOPY WITH BILATERAL  RETROGRADE PYELOGRAM/ LEFT URETERAL STENT PLACEMENT;  Surgeon: Raynelle Bring, MD;  Location: [facility];  Service: Urology;  Laterality: N/A;  . IR CV LINE INJECTION  06/23/2018  . IR IMAGING GUIDED PORT INSERTION  05/21/2018  . TRANSURETHRAL RESECTION OF BLADDER TUMOR N/A 04/06/2017   Procedure: TRANSURETHRAL RESECTION OF BLADDER TUMOR (TURBT);  Surgeon: Raynelle Bring, MD;  Location: [facility];  Service: Urology;  Laterality: N/A;  . TRANSURETHRAL RESECTION OF BLADDER TUMOR N/A 10/08/2017   Procedure: TRANSURETHRAL RESECTION OF BLADDER TUMOR (TURBT);  Surgeon: Raynelle Bring, MD;  Location: [facility];  Service: Urology;  Laterality: N/A;  . TRANSURETHRAL RESECTION OF BLADDER TUMOR N/A 01/28/2018   Procedure: TRANSURETHRAL RESECTION OF BLADDER TUMOR (TURBT);  Surgeon: Raynelle Bring, MD;  Location: [facility];  Service: Urology;  Laterality: N/A;  GENERAL ANESTHESIA WITH PARALYSIS  . TRANSURETHRAL RESECTION OF BLADDER TUMOR WITH MITOMYCIN-C N/A 03/02/2017   Procedure: TRANSURETHRAL RESECTION OF BLADDER TUMOR/ EXAM UNDER ANESTHESIA/ WITH MITOMYCIN-C POST OPERATIVE;  Surgeon: Raynelle Bring, MD;  Location: [facility];  Service: Urology;  Laterality: N/A;        Home Medications    Prior to Admission medications   Medication Sig Start Date End Date Taking? Authorizing Provider  benzonatate (TESSALON) 100 MG capsule  Take 1 capsule (100 mg total) by mouth 3 (three) times daily as needed for cough. 05/19/18  Yes Brunetta Genera, MD  Cyanocobalamin (VITAMIN B 12 PO) Take 1 tablet by mouth daily.   Yes [provider]  dexamethasone (DECADRON) 4 MG tablet Take 2 tablets (8 mg total) by mouth daily. Start the day after carboplatin chemotherapy  for 2 days. 05/18/18  Yes Brunetta Genera, MD  diltiazem (CARDIZEM CD) 360 MG 24 hr capsule TAKE 1 CAPSULE BY MOUTH  DAILY. 06/18/18  Yes Martinique, Betty G, MD  dronabinol (MARINOL) 5 MG capsule Take 1 capsule (5 mg total) by mouth 2 (two) times daily before a meal. 05/19/18  Yes Brunetta Genera, MD  Ferrous Gluconate (IRON 27 PO) Take 27 mg by mouth daily.   Yes [provider]  Glucos-Chondroit-Hyaluron-MSM (GLUCOSAMINE CHONDROITIN JOINT PO) Take 1,500 mg by mouth daily.   Yes [provider]  HYDROcodone-acetaminophen (NORCO/VICODIN) 5-325 MG tablet Take 1 tablet by mouth every 6 (six) hours as needed for moderate pain or severe pain.  06/23/18  Yes [provider]  lidocaine-prilocaine (EMLA) cream Apply to affected area once 05/18/18  Yes Kale, Cloria Spring, MD  LORazepam (ATIVAN) 0.5 MG tablet Take 1 tablet (0.5 mg total) by mouth every 6 (six) hours as needed (Nausea or vomiting). 05/18/18  Yes Brunetta Genera, MD  ranitidine (ZANTAC) 300 MG tablet TAKE 1 TABLET BY MOUTH  EVERY MORNING. 06/18/18  Yes Martinique, Betty G, MD  senna-docusate (SENNA S) 8.6-50 MG tablet Take 2 tablets by mouth at bedtime. 06/09/18  Yes Brunetta Genera, MD  acetaminophen (TYLENOL) 500 MG tablet Take 1,000 mg by mouth daily as needed for moderate pain.     [provider]  cefpodoxime (VANTIN) 100 MG tablet Take 1 tablet (100 mg total) by mouth 2 (two) times daily. Patient not taking: Reported on 06/23/2018 06/09/18   Brunetta Genera, MD  enoxaparin (LOVENOX) 80 MG/0.8ML injection Inject 0.8 mLs (80 mg total) into the skin every 12 (twelve) hours. Start taking 06/24/2018 at 6pm 06/23/18   Brunetta Genera, MD  guaiFENesin-dextromethorphan [facility] DM) 100-10 MG/5ML syrup Take 5 mLs by mouth 3 (three) times daily as needed for cough. Patient not taking: Reported on 05/06/2018 04/26/18   Davonna Belling, MD  ondansetron (ZOFRAN) 8 MG tablet Take 1 tablet (8 mg total)  by mouth 2 (two) times daily as needed for refractory nausea / vomiting. Start on day 3 after carboplatin chemo. 05/18/18   Brunetta Genera, MD  ondansetron (ZOFRAN-ODT) 4 MG disintegrating tablet Take 1 tablet (4 mg total) by mouth every 8 (eight) hours as needed for nausea or vomiting. 04/28/18   Brunetta Genera, MD  phenazopyridine (PYRIDIUM) 200 MG tablet Take 1 tablet (200 mg total) by mouth 3 (three) times daily. Patient not taking: Reported on 06/23/2018 05/29/18   Tanna Furry, MD  prochlorperazine (COMPAZINE) 10 MG tablet Take 1 tablet (10 mg total) by mouth every 6 (six) hours as needed (Nausea or vomiting). 05/18/18   Brunetta Genera, MD  sildenafil (VIAGRA) 50 MG tablet Take 1 tablet (50 mg total) by mouth daily as needed for erectile dysfunction. Patient not taking: Reported on 05/29/2018 04/20/18   Martinique, Betty G, MD    Family History Family History  Problem Relation Age of Onset  . Hypertension Mother   . Stroke Mother   . Diabetes Mother   . Cataracts Mother   . Kidney disease Mother   .  Hypertension Father   . Stroke Father   . Aneurysm Maternal Grandmother     Social History Social History   Tobacco Use  . Smoking status: Former Smoker    Packs/day: 0.15    Years: 10.00    Pack years: 1.50    Types: Cigarettes    Last attempt to quit: 1980    Years since quitting: 39.5  . Smokeless tobacco: Never Used  Substance Use Topics  . Alcohol use: Not Currently  . Drug use: No    Comment: "years ago"     Allergies   Patient has no known allergies.   Review of Systems Review of Systems  All other systems reviewed and are negative.    Physical Exam Updated Vital Signs BP 136/85   Pulse 98   Temp 98.3 F (36.8 C) (Oral)   Resp 18   Ht 1.829 m (6')   Wt 76.7 kg (169 lb)   SpO2 96%   BMI 22.92 kg/m   Physical Exam  Constitutional: He appears well-developed and well-nourished. No distress.  HENT:  Head: Normocephalic and atraumatic.    Right Ear: External ear normal.  Left Ear: External ear normal.  Eyes: Conjunctivae are normal. Right eye exhibits no discharge. Left eye exhibits no discharge. No scleral icterus.  Neck: Neck supple. No tracheal deviation present.  Cardiovascular: Normal rate, regular rhythm and intact distal pulses.  Pulmonary/Chest: Effort normal. No stridor. No respiratory distress. He has no wheezes. He has no rales.  Decreased breath sounds RLL, no erythema around the portacath site  Abdominal: Soft. Bowel sounds are normal. He exhibits no distension. There is no tenderness. There is no rebound and no guarding.  Musculoskeletal: He exhibits no edema or tenderness.  Neurological: He is alert. He has normal strength. No cranial nerve deficit (no facial droop, extraocular movements intact, no slurred speech) or sensory deficit. He exhibits normal muscle tone. He displays no seizure activity. Coordination normal.  Skin: Skin is warm and dry. No rash noted.  Psychiatric: He has a normal mood and affect.  Nursing note and vitals reviewed.    ED Treatments / Results  Labs (all labs ordered are listed, but only abnormal results are displayed) Labs Reviewed  COMPREHENSIVE METABOLIC PANEL - Abnormal; Notable for the following components:      Result Value   Glucose, Bld 117 (*)    Creatinine, Ser 1.39 (*)    Calcium 8.8 (*)    Albumin 2.7 (*)    AST 70 (*)    ALT 74 (*)    Alkaline Phosphatase 184 (*)    GFR calc non Af Amer 50 (*)    GFR calc Af Amer 58 (*)    All other components within normal limits  CBC WITH DIFFERENTIAL/PLATELET - Abnormal; Notable for the following components:   WBC 21.5 (*)    RBC 3.49 (*)    Hemoglobin 8.3 (*)    HCT 27.7 (*)    MCH 23.8 (*)    RDW 19.1 (*)    Platelets 450 (*)    Neutro Abs 18.0 (*)    Monocytes Absolute 1.6 (*)    All other components within normal limits  URINALYSIS, ROUTINE W REFLEX MICROSCOPIC - Abnormal; Notable for the following components:    APPearance HAZY (*)    Ketones, ur 5 (*)    All other components within normal limits  I-STAT CG4 LACTIC ACID, ED    EKG None  Radiology Dg Chest 2 View  Result Date: 06/23/2018 CLINICAL DATA:  Fever and cough EXAM: CHEST - 2 VIEW COMPARISON:  04/26/2018 CXR and CT FINDINGS: New moderate right pleural effusion partially obscuring a dominant mass in the right lower lobe as well as the right hemidiaphragm and heart border. Previously noted pulmonary masses are redemonstrated though slightly more prominent in appearance. Right IJ central line catheter is seen in the proximal SVC. No frank bone destruction is identified. IMPRESSION: New moderate right pleural effusion partially obscuring a dominant mass in the right lower lobe as well as the right hemidiaphragm and heart border. Subjectively, the previously noted bilateral pulmonary masses and nodules appear more prominent/larger in size. Electronically Signed   By: Ashley Royalty M.D.   On: 06/23/2018 22:20   Ir Cv Line Injection  Result Date: 06/23/2018 INDICATION: 72 year old with metastatic urothelial cancer. Port-A-Cath was placed on 05/21/2018. Patient is complaining of right neck pain. EXAM: EVALUATION OF THE RIGHT CHEST PORT-A-CATH WITH LINE INJECTION Physician: Stephan Minister. Anselm Pancoast, MD FLUOROSCOPY TIME:  6 seconds, 24 mGy MEDICATIONS: Lovenox 80 mg subcutaneous CONTRAST:  3 mL Isovue-300 ANESTHESIA/SEDATION: None PROCEDURE: The right chest port was accessed using sterile technique. A small amount of blood was able to be aspirated. Fluoroscopy demonstrated that the catheter is now malpositioned within the right neck. Contrast injection demonstrated irregular filling of the right jugular vein. Ultrasound demonstrated thrombosis of the right jugular vein. Catheter was flushed with a small amount of heparinized saline. The port access needle was removed. FINDINGS: Catheter has flipped up into the right jugular vein. Previously, the catheter was well positioned  at the superior cavoatrial junction. In addition, there is thrombus within the right jugular vein. Inferior aspect of the right jugular vein appears to be occluded. COMPLICATIONS: None IMPRESSION: Port-A-Cath is malpositioned. The catheter is coiled within the right jugular vein. In addition, the right jugular vein contains thrombus. These findings were discussed with Dr. Irene Limbo. Patient was given a dose of subcutaneous Lovenox and was taken to vascular ultrasound for evaluation of additional DVT in the upper extremities. Patient is scheduled to return to [facility] on 06/24/2018 for removal of the right jugular Port-A-Cath and possible placement of a left-sided chest port. Electronically Signed   By: Markus Daft M.D.   On: 06/23/2018 17:20    Procedures Procedures (including critical care time)  Medications Ordered in ED Medications - No data to display   Initial Impression / Assessment and Plan / ED Course  I have reviewed the triage vital signs and the nursing notes.  Pertinent labs & imaging results that were available during my care of the patient were reviewed by me and considered in my medical decision making (see chart for details).  Clinical Course as of Jun 25 43  Wed Jun 23, 2018  2306 Unchanged compared to pror  Comprehensive metabolic panel(!) [JK]  6283 WBC increasing   [JK]  Thu Jun 24, 2018  0042 I discussed the case with Dr Irene Limbo.   Pt was given neulasta which likely is causing the increased wbc.  Hold off on abx right now   [JK]    Clinical Course User Index [JK] Dorie Rank, MD    Patient presented to the emergency room for evaluation of a fever at home.  Family states it was up to 106 however he was not given any antipyretics and his temperature was down to 98.3.  I wonder if it was 100.6.  Patient is not having any acute signs of infection.  He does  have an elevation of his white blood cell count but this may be related to his adjunctive agents for his  chemotherapy.  Chest x-ray does show his known lung mass, lymphadenopathy and associated pleural effusion.  I doubt that this is an empyema.  Urinalysis is pending.  Dr. Wyvonnia Dusky will follow up on that result.  Plan on dc home  Final Clinical Impressions(s) / ED Diagnoses   Final diagnoses:  Fever, unspecified fever cause    ED Discharge Orders    None       Dorie Rank, MD 06/24/18 [phone]

## 2018-06-24 ENCOUNTER — Ambulatory Visit (HOSPITAL_COMMUNITY)
Admission: RE | Admit: 2018-06-24 | Discharge: 2018-06-24 | Disposition: A | Discharge status: Home / Self Care | Payer: Medicare Other | Source: Ambulatory Visit | Attending: Hematology | Admitting: Hematology

## 2018-06-24 ENCOUNTER — Encounter (HOSPITAL_COMMUNITY): Payer: Self-pay | Admitting: Interventional Radiology

## 2018-06-24 ENCOUNTER — Ambulatory Visit: Payer: Medicare Other

## 2018-06-24 DIAGNOSIS — C679 Malignant neoplasm of bladder, unspecified: Secondary | ICD-10-CM

## 2018-06-24 DIAGNOSIS — R509 Fever, unspecified: Secondary | ICD-10-CM | POA: Diagnosis not present

## 2018-06-24 DIAGNOSIS — T82524A Displacement of infusion catheter, initial encounter: Secondary | ICD-10-CM | POA: Diagnosis not present

## 2018-06-24 DIAGNOSIS — Z95828 Presence of other vascular implants and grafts: Secondary | ICD-10-CM

## 2018-06-24 HISTORY — PX: IR REMOVAL TUN ACCESS W/ PORT W/O FL MOD SED: IMG2290

## 2018-06-24 LAB — CBC
HCT: 26.7 % — ABNORMAL LOW (ref 39.0–52.0)
Hemoglobin: 8.3 g/dL — ABNORMAL LOW (ref 13.0–17.0)
MCH: 24.6 pg — ABNORMAL LOW (ref 26.0–34.0)
MCHC: 31.1 g/dL (ref 30.0–36.0)
MCV: 79.2 fL (ref 78.0–100.0)
Platelets: 412 10*3/uL — ABNORMAL HIGH (ref 150–400)
RBC: 3.37 MIL/uL — ABNORMAL LOW (ref 4.22–5.81)
RDW: 19.1 % — ABNORMAL HIGH (ref 11.5–15.5)
WBC: 19.1 10*3/uL — ABNORMAL HIGH (ref 4.0–10.5)

## 2018-06-24 LAB — URINALYSIS, ROUTINE W REFLEX MICROSCOPIC
Bilirubin Urine: NEGATIVE
Glucose, UA: NEGATIVE mg/dL
Hgb urine dipstick: NEGATIVE
Ketones, ur: 5 mg/dL — AB
Leukocytes, UA: NEGATIVE
Nitrite: NEGATIVE
Protein, ur: NEGATIVE mg/dL
Specific Gravity, Urine: 1.021 (ref 1.005–1.030)
pH: 5 (ref 5.0–8.0)

## 2018-06-24 LAB — PROTIME-INR
INR: 1.11
Prothrombin Time: 14.2 seconds (ref 11.4–15.2)

## 2018-06-24 LAB — APTT: aPTT: 39 seconds — ABNORMAL HIGH (ref 24–36)

## 2018-06-24 MED ORDER — LIDOCAINE HCL (PF) 1 % IJ SOLN
INTRAMUSCULAR | Status: DC
Start: 2018-06-24 — End: 2018-06-25
  Filled 2018-06-24: qty 30

## 2018-06-24 MED ORDER — MIDAZOLAM HCL 2 MG/2ML IJ SOLN
INTRAMUSCULAR | Status: AC
  Filled 2018-06-24: qty 2

## 2018-06-24 MED ORDER — LIDOCAINE HCL (PF) 1 % IJ SOLN
INTRAMUSCULAR | Status: AC | PRN
  Administered 2018-06-24: 10 mL

## 2018-06-24 MED ORDER — FENTANYL CITRATE (PF) 100 MCG/2ML IJ SOLN
INTRAMUSCULAR | Status: AC | PRN
  Administered 2018-06-24 (×2): 50 ug via INTRAVENOUS

## 2018-06-24 MED ORDER — CEFAZOLIN SODIUM-DEXTROSE 2-4 GM/100ML-% IV SOLN
INTRAVENOUS | Status: AC
  Administered 2018-06-24: 2 g via INTRAVENOUS
  Filled 2018-06-24: qty 100

## 2018-06-24 MED ORDER — SODIUM CHLORIDE 0.9 % IV SOLN
INTRAVENOUS | Status: DC
  Administered 2018-06-24: 10:00:00 via INTRAVENOUS

## 2018-06-24 MED ORDER — MIDAZOLAM HCL 2 MG/2ML IJ SOLN
INTRAMUSCULAR | Status: AC | PRN
  Administered 2018-06-24 (×2): 1 mg via INTRAVENOUS

## 2018-06-24 MED ORDER — CEFAZOLIN SODIUM-DEXTROSE 2-4 GM/100ML-% IV SOLN
2.0000 g | Freq: Once | INTRAVENOUS | Status: AC
  Administered 2018-06-24: 2 g via INTRAVENOUS

## 2018-06-24 MED ORDER — FENTANYL CITRATE (PF) 100 MCG/2ML IJ SOLN
INTRAMUSCULAR | Status: AC
  Filled 2018-06-24: qty 2

## 2018-06-24 NOTE — Discharge Instructions (Signed)
Follow-up with your cancer doctor as planned, under for any recurrent episodes of fever.

## 2018-06-24 NOTE — H&P (Signed)
Chief Complaint: Patient was seen in consultation today for port removal at the request of Brunetta Genera  Referring Physician(s): Brunetta Genera  Supervising Physician: Dr. Kathlene Cote  Patient Status: Green  History of Present Illness: Jeremy Patrick is a 72 y.o. male with metastatic bladder cancer. He had port placed on 6/14 and was receiving treatment. However, he developed pain in his right night. A port check yesterday revealed the tubing has retracted into the jugular and has caused DVT to develop. He was given Lovenox and is here today for removal. PMHx, meds, labs, imaging, allergies reviewed. Has been NPO today as directed. Family at bedside.   Past Medical History:  Diagnosis Date  . Cancer [facility])    bladder cancer  . GERD (gastroesophageal reflux disease)   . Hypertension     Past Surgical History:  Procedure Laterality Date  . BLADDER SURGERY    . CYSTOSCOPY N/A 04/06/2017   Procedure: CYSTOSCOPY;  Surgeon: Raynelle Bring, MD;  Location: [facility];  Service: Urology;  Laterality: N/A;  . CYSTOSCOPY W/ RETROGRADES N/A 01/28/2018   Procedure: CYSTOSCOPY WITH RETROGRADE PYELOGRAM;  Surgeon: Raynelle Bring, MD;  Location: [facility];  Service: Urology;  Laterality: N/A;  . CYSTOSCOPY W/ URETERAL STENT PLACEMENT N/A 10/08/2017   Procedure: CYSTOSCOPY WITH BILATERAL  RETROGRADE PYELOGRAM/ LEFT URETERAL STENT PLACEMENT;  Surgeon: Raynelle Bring, MD;  Location: [facility];  Service: Urology;  Laterality: N/A;  . IR CV LINE INJECTION  06/23/2018  . IR IMAGING GUIDED PORT INSERTION  05/21/2018  . TRANSURETHRAL RESECTION OF BLADDER TUMOR N/A 04/06/2017   Procedure: TRANSURETHRAL RESECTION OF BLADDER TUMOR (TURBT);  Surgeon: Raynelle Bring, MD;  Location: [facility];  Service: Urology;  Laterality: N/A;  . TRANSURETHRAL RESECTION OF BLADDER TUMOR N/A 10/08/2017   Procedure: TRANSURETHRAL RESECTION OF BLADDER TUMOR (TURBT);  Surgeon: Raynelle Bring, MD;  Location: [facility];   Service: Urology;  Laterality: N/A;  . TRANSURETHRAL RESECTION OF BLADDER TUMOR N/A 01/28/2018   Procedure: TRANSURETHRAL RESECTION OF BLADDER TUMOR (TURBT);  Surgeon: Raynelle Bring, MD;  Location: [facility];  Service: Urology;  Laterality: N/A;  GENERAL ANESTHESIA WITH PARALYSIS  . TRANSURETHRAL RESECTION OF BLADDER TUMOR WITH MITOMYCIN-C N/A 03/02/2017   Procedure: TRANSURETHRAL RESECTION OF BLADDER TUMOR/ EXAM UNDER ANESTHESIA/ WITH MITOMYCIN-C POST OPERATIVE;  Surgeon: Raynelle Bring, MD;  Location: [facility];  Service: Urology;  Laterality: N/A;    Allergies: Patient has no known allergies.  Medications: Prior to Admission medications   Medication Sig Start Date End Date Taking? Authorizing Provider  benzonatate (TESSALON) 100 MG capsule Take 1 capsule (100 mg total) by mouth 3 (three) times daily as needed for cough. 05/19/18  Yes Brunetta Genera, MD  Cyanocobalamin (VITAMIN B 12 PO) Take 1 tablet by mouth daily.   Yes [provider]  dexamethasone (DECADRON) 4 MG tablet Take 2 tablets (8 mg total) by mouth daily. Start the day after carboplatin chemotherapy for 2 days. 05/18/18  Yes Brunetta Genera, MD  diltiazem (CARDIZEM CD) 360 MG 24 hr capsule Take 1 capsule (360 mg total) by mouth daily. Must keep scheduled appt w/new provider for future refills 04/30/18  Yes Martinique, Betty G, MD  Ferrous Gluconate (IRON 27 PO) Take 27 mg by mouth daily.   Yes [provider]  Glucos-Chondroit-Hyaluron-MSM (GLUCOSAMINE CHONDROITIN JOINT PO) Take 1,500 mg by mouth daily.   Yes [provider]  LORazepam (ATIVAN) 0.5 MG tablet Take 1 tablet (0.5 mg total) by mouth every 6 (six)  hours as needed (Nausea or vomiting). 05/18/18  Yes Brunetta Genera, MD  ondansetron (ZOFRAN) 8 MG tablet Take 1 tablet (8 mg total) by mouth 2 (two) times daily as needed for refractory nausea / vomiting. Start on day 3 after carboplatin chemo. 05/18/18  Yes Brunetta Genera, MD  ondansetron  (ZOFRAN-ODT) 4 MG disintegrating tablet Take 1 tablet (4 mg total) by mouth every 8 (eight) hours as needed for nausea or vomiting. 04/28/18  Yes Brunetta Genera, MD  prochlorperazine (COMPAZINE) 10 MG tablet Take 1 tablet (10 mg total) by mouth every 6 (six) hours as needed (Nausea or vomiting). 05/18/18  Yes Brunetta Genera, MD  ranitidine (ZANTAC) 300 MG tablet Take 1 tablet (300 mg total) by mouth every morning. Must keep appt for future refills 04/30/18  Yes Martinique, Betty G, MD  sildenafil (VIAGRA) 50 MG tablet Take 1 tablet (50 mg total) by mouth daily as needed for erectile dysfunction. 04/20/18  Yes Martinique, Betty G, MD  dronabinol (MARINOL) 5 MG capsule Take 1 capsule (5 mg total) by mouth 2 (two) times daily before a meal. 05/19/18   Brunetta Genera, MD  guaiFENesin-dextromethorphan [facility] DM) 100-10 MG/5ML syrup Take 5 mLs by mouth 3 (three) times daily as needed for cough. Patient not taking: Reported on 05/06/2018 04/26/18   Davonna Belling, MD  lidocaine-prilocaine (EMLA) cream Apply to affected area once 05/18/18   Brunetta Genera, MD     Family History  Problem Relation Age of Onset  . Hypertension Mother   . Stroke Mother   . Diabetes Mother   . Cataracts Mother   . Kidney disease Mother   . Hypertension Father   . Stroke Father   . Aneurysm Maternal Grandmother     Social History   Socioeconomic History  . Marital status: Married    Spouse name: Not on file  . Number of children: 4  . Years of education: 10  . Highest education level: Not on file  Occupational History  . Occupation: Retired  Scientific laboratory technician  . Financial resource strain: Not on file  . Food insecurity:    Worry: Not on file    Inability: Not on file  . Transportation needs:    Medical: Not on file    Non-medical: Not on file  Tobacco Use  . Smoking status: Former Smoker    Packs/day: 0.15    Years: 10.00    Pack years: 1.50    Types: Cigarettes    Last attempt to quit:  1980    Years since quitting: 39.5  . Smokeless tobacco: Never Used  Substance and Sexual Activity  . Alcohol use: Not Currently  . Drug use: No    Comment: "years ago"  . Sexual activity: Not on file  Lifestyle  . Physical activity:    Days per week: Not on file    Minutes per session: Not on file  . Stress: Not on file  Relationships  . Social connections:    Talks on phone: Not on file    Gets together: Not on file    Attends religious service: Not on file    Active member of club or organization: Not on file    Attends meetings of clubs or organizations: Not on file    Relationship status: Not on file  Other Topics Concern  . Not on file  Social History Narrative   Fun/Hobby: Go to [employer] and travel.      Review of  Systems: A 12 point ROS discussed and pertinent positives are indicated in the HPI above.  All other systems are negative.  Review of Systems  Vital Signs: BP (!) 145/85   Pulse (!) 106   Temp 97.7 F (36.5 C) (Oral)   Resp 18   Ht 6' (1.829 m)   Wt 169 lb (76.7 kg)   SpO2 95%   BMI 22.92 kg/m   Physical Exam  Constitutional: He is oriented to person, place, and time. He appears well-developed. No distress.  HENT:  Head: Normocephalic.  Mouth/Throat: Oropharynx is clear and moist.  Neck: Normal range of motion. No tracheal deviation present.  Tender over (R)IJ  Cardiovascular: Normal rate, regular rhythm and normal heart sounds.  Pulmonary/Chest: Effort normal and breath sounds normal. No respiratory distress.  Neurological: He is alert and oriented to person, place, and time.  Skin: Skin is warm and dry.  Psychiatric: He has a normal mood and affect.     Imaging: Dg Chest 2 View  Result Date: 06/23/2018 CLINICAL DATA:  Fever and cough EXAM: CHEST - 2 VIEW COMPARISON:  04/26/2018 CXR and CT FINDINGS: New moderate right pleural effusion partially obscuring a dominant mass in the right lower lobe as well as the right hemidiaphragm and heart  border. Previously noted pulmonary masses are redemonstrated though slightly more prominent in appearance. Right IJ central line catheter is seen in the proximal SVC. No frank bone destruction is identified. IMPRESSION: New moderate right pleural effusion partially obscuring a dominant mass in the right lower lobe as well as the right hemidiaphragm and heart border. Subjectively, the previously noted bilateral pulmonary masses and nodules appear more prominent/larger in size. Electronically Signed   By: Ashley Royalty M.D.   On: 06/23/2018 22:20   Ir Cv Line Injection  Result Date: 06/23/2018 INDICATION: 72 year old with metastatic urothelial cancer. Port-A-Cath was placed on 05/21/2018. Patient is complaining of right neck pain. EXAM: EVALUATION OF THE RIGHT CHEST PORT-A-CATH WITH LINE INJECTION Physician: Stephan Minister. Anselm Pancoast, MD FLUOROSCOPY TIME:  6 seconds, 24 mGy MEDICATIONS: Lovenox 80 mg subcutaneous CONTRAST:  3 mL Isovue-300 ANESTHESIA/SEDATION: None PROCEDURE: The right chest port was accessed using sterile technique. A small amount of blood was able to be aspirated. Fluoroscopy demonstrated that the catheter is now malpositioned within the right neck. Contrast injection demonstrated irregular filling of the right jugular vein. Ultrasound demonstrated thrombosis of the right jugular vein. Catheter was flushed with a small amount of heparinized saline. The port access needle was removed. FINDINGS: Catheter has flipped up into the right jugular vein. Previously, the catheter was well positioned at the superior cavoatrial junction. In addition, there is thrombus within the right jugular vein. Inferior aspect of the right jugular vein appears to be occluded. COMPLICATIONS: None IMPRESSION: Port-A-Cath is malpositioned. The catheter is coiled within the right jugular vein. In addition, the right jugular vein contains thrombus. These findings were discussed with Dr. Irene Limbo. Patient was given a dose of subcutaneous  Lovenox and was taken to vascular ultrasound for evaluation of additional DVT in the upper extremities. Patient is scheduled to return to Inter[facility] Radiology on 06/24/2018 for removal of the right jugular Port-A-Cath and possible placement of a left-sided chest port. Electronically Signed   By: Markus Daft M.D.   On: 06/23/2018 17:20    Labs:  CBC: Recent Labs    06/09/18 0938 06/16/18 0807 06/23/18 2129 06/24/18 0952  WBC 11.4* 12.8* 21.5* 19.1*  HGB 9.5* 8.9* 8.3* 8.3*  HCT 30.5* 28.4*  27.7* 26.7*  PLT 488* 253 450* 412*    COAGS: Recent Labs    05/06/18 0729 05/21/18 1047 06/24/18 0952  INR 1.06 1.03 1.11  APTT 39* 32 39*    BMP: Recent Labs    05/29/18 1557 06/09/18 0938 06/16/18 0807 06/23/18 2129  NA 138 137 140 137  K 4.4 4.5 4.5 4.1  CL 102 102 101 102  CO2 27 27 26 23   GLUCOSE 114* 117* 133* 117*  BUN 25* 18 20 22   CALCIUM 8.2* 9.3 9.4 8.8*  CREATININE 1.75* 1.64* 1.51* 1.39*  GFRNONAA 38* 41* 45* 50*  GFRAA 44* 47* 52* 58*    LIVER FUNCTION TESTS: Recent Labs    05/26/18 0841 06/09/18 0938 06/16/18 0807 06/23/18 2129  BILITOT 0.5 0.3 0.5 0.4  AST 27 39 99* 70*  ALT 33 37 109* 74*  ALKPHOS 80 120 183* 184*  PROT 7.5 7.3 7.5 7.2  ALBUMIN 3.2* 3.0* 2.8* 2.7*    TUMOR MARKERS: No results for input(s): AFPTM, CEA, CA199, CHROMGRNA in the last 8760 hours.  Assessment and Plan: Metastatic urothelial cancer Malpositioned (R)IJ port with acute (R)IJ DVT Plan for removal Labs ok  All of the patient's questions were answered, patient is agreeable to proceed. Consent signed and in chart.    Thank you for this interesting consult.  I greatly enjoyed meeting Jeremy Patrick and look forward to participating in their care.  A copy of this report was sent to the requesting provider on this date.  Electronically Signed: Ascencion Dike, PA-C 06/24/2018, 11:15 AM   I spent a total of 20 minutes in face to face in clinical consultation,  greater than 50% of which was counseling/coordinating care for port removal.

## 2018-06-24 NOTE — Procedures (Signed)
Interventional Radiology Procedure Note  Procedure: Port removal  Complications: None  Estimated Blood Loss: < 10 mL  Findings: Right chest port and attached catheter removed in entirety.  Wound closed.  Venetia Night. Kathlene Cote, M.D Pager:  [phone]

## 2018-06-24 NOTE — Discharge Instructions (Signed)
Moderate Conscious Sedation, Adult, Care After °These instructions provide you with information about caring for yourself after your procedure. Your health care provider may also give you more specific instructions. Your treatment has been planned according to current medical practices, but problems sometimes occur. Call your health care provider if you have any problems or questions after your procedure. °What can I expect after the procedure? °After your procedure, it is common: °· To feel sleepy for several hours. °· To feel clumsy and have poor balance for several hours. °· To have poor judgment for several hours. °· To vomit if you eat too soon. ° °Follow these instructions at home: °For at least 24 hours after the procedure: ° °· Do not: °? Participate in activities where you could fall or become injured. °? Drive. °? Use heavy machinery. °? Drink alcohol. °? Take sleeping pills or medicines that cause drowsiness. °? Make important decisions or sign legal documents. °? Take care of children on your own. °· Rest. °Eating and drinking °· Follow the diet recommended by your health care provider. °· If you vomit: °? Drink water, juice, or soup when you can drink without vomiting. °? Make sure you have little or no nausea before eating solid foods. °General instructions °· Have a responsible adult stay with you until you are awake and alert. °· Take over-the-counter and prescription medicines only as told by your health care provider. °· If you smoke, do not smoke without supervision. °· Keep all follow-up visits as told by your health care provider. This is important. °Contact a health care provider if: °· You keep feeling nauseous or you keep vomiting. °· You feel light-headed. °· You develop a rash. °· You have a fever. °Get help right away if: °· You have trouble breathing. °This information is not intended to replace advice given to you by your health care provider. Make sure you discuss any questions you have  with your health care provider. °Document Released: 09/14/2013 Document Revised: 04/28/2016 Document Reviewed: 03/15/2016 °Elsevier Interactive Patient Education © 2018 Elsevier Inc[employer] ° ° ° ° °Implanted Port Removal, Care After °Refer to this sheet in the next few weeks. These instructions provide you with information about caring for yourself after your procedure. Your health care provider may also give you more specific instructions. Your treatment has been planned according to current medical practices, but problems sometimes occur. Call your health care provider if you have any problems or questions after your procedure. °What can I expect after the procedure? °After the procedure, it is common to have: °· Soreness or pain near your incision. °· Some swelling or bruising near your incision. ° °Follow these instructions at home: °Medicines °· Take over-the-counter and prescription medicines only as told by your health care provider. °· If you were prescribed an antibiotic medicine, take it as told by your health care provider. Do not stop taking the antibiotic even if you start to feel better. °Bathing °· Do not take baths, swim, or use a hot tub until your health care provider approves. Ask your health care provider if you can take showers. You may only be allowed to take sponge baths for bathing. °Incision care °· Follow instructions from your health care provider about how to take care of your incision. Make sure you: °? Wash your hands with soap and water before you change your bandage (dressing). If soap and water are not available, use hand sanitizer. °? Change your dressing as told by your health care provider. °?   Keep your dressing dry. °? Leave stitches (sutures), skin glue, or adhesive strips in place. These skin closures may need to stay in place for 2 weeks or longer. If adhesive strip edges start to loosen and curl up, you may trim the loose edges. Do not remove adhesive strips completely unless your  health care provider tells you to do that. °· Check your incision area every day for signs of infection. Check for: °? More redness, swelling, or pain. °? More fluid or blood. °? Warmth. °? Pus or a bad smell. °Driving °· If you received a sedative, do not drive for 24 hours after the procedure. °· If you did not receive a sedative, ask your health care provider when it is safe to drive. °Activity °· Return to your normal activities as told by your health care provider. Ask your health care provider what activities are safe for you. °· Until your health care provider says it is safe: °? Do not lift anything that is heavier than 10 lb (4.5 kg). °? Do not do activities that involve lifting your arms over your head. °General instructions °· Do not use any tobacco products, such as cigarettes, chewing tobacco, and e-cigarettes. Tobacco can delay healing. If you need help quitting, ask your health care provider. °· Keep all follow-up visits as told by your health care provider. This is important. °Contact a health care provider if: °· You have more redness, swelling, or pain around your incision. °· You have more fluid or blood coming from your incision. °· Your incision feels warm to the touch. °· You have pus or a bad smell coming from your incision. °· You have a fever. °· You have pain that is not relieved by your pain medicine. °Get help right away if: °· You have chest pain. °· You have difficulty breathing. °This information is not intended to replace advice given to you by your health care provider. Make sure you discuss any questions you have with your health care provider. °Document Released: 11/05/2015 Document Revised: 05/01/2016 Document Reviewed: 08/29/2015 °Elsevier Interactive Patient Education © 2018 Elsevier Inc. ° °

## 2018-06-30 ENCOUNTER — Telehealth: Payer: Self-pay | Admitting: Hematology

## 2018-06-30 ENCOUNTER — Other Ambulatory Visit: Payer: Self-pay

## 2018-06-30 ENCOUNTER — Inpatient Hospital Stay (HOSPITAL_BASED_OUTPATIENT_CLINIC_OR_DEPARTMENT_OTHER): Payer: Medicare Other | Admitting: Hematology

## 2018-06-30 ENCOUNTER — Inpatient Hospital Stay: Payer: Medicare Other

## 2018-06-30 ENCOUNTER — Encounter: Payer: Self-pay | Admitting: Hematology

## 2018-06-30 ENCOUNTER — Inpatient Hospital Stay (HOSPITAL_BASED_OUTPATIENT_CLINIC_OR_DEPARTMENT_OTHER): Payer: Medicare Other | Admitting: Medical

## 2018-06-30 VITALS — BP 108/58 | HR 70 | Temp 98.4°F | Resp 17

## 2018-06-30 VITALS — BP 133/91 | HR 108 | Temp 98.6°F | Resp 18 | Ht 72.0 in | Wt 163.5 lb

## 2018-06-30 DIAGNOSIS — C679 Malignant neoplasm of bladder, unspecified: Secondary | ICD-10-CM | POA: Diagnosis not present

## 2018-06-30 DIAGNOSIS — C7800 Secondary malignant neoplasm of unspecified lung: Secondary | ICD-10-CM

## 2018-06-30 DIAGNOSIS — D649 Anemia, unspecified: Secondary | ICD-10-CM

## 2018-06-30 DIAGNOSIS — R05 Cough: Secondary | ICD-10-CM

## 2018-06-30 DIAGNOSIS — R634 Abnormal weight loss: Secondary | ICD-10-CM

## 2018-06-30 DIAGNOSIS — N189 Chronic kidney disease, unspecified: Secondary | ICD-10-CM | POA: Diagnosis not present

## 2018-06-30 DIAGNOSIS — Z79899 Other long term (current) drug therapy: Secondary | ICD-10-CM

## 2018-06-30 DIAGNOSIS — M549 Dorsalgia, unspecified: Secondary | ICD-10-CM | POA: Diagnosis not present

## 2018-06-30 DIAGNOSIS — C7910 Secondary malignant neoplasm of unspecified urinary organs: Secondary | ICD-10-CM

## 2018-06-30 DIAGNOSIS — R5383 Other fatigue: Secondary | ICD-10-CM

## 2018-06-30 DIAGNOSIS — R590 Localized enlarged lymph nodes: Secondary | ICD-10-CM

## 2018-06-30 DIAGNOSIS — Z5111 Encounter for antineoplastic chemotherapy: Secondary | ICD-10-CM | POA: Diagnosis not present

## 2018-06-30 DIAGNOSIS — C782 Secondary malignant neoplasm of pleura: Secondary | ICD-10-CM

## 2018-06-30 DIAGNOSIS — I82611 Acute embolism and thrombosis of superficial veins of right upper extremity: Secondary | ICD-10-CM

## 2018-06-30 DIAGNOSIS — K8020 Calculus of gallbladder without cholecystitis without obstruction: Secondary | ICD-10-CM

## 2018-06-30 DIAGNOSIS — Z7189 Other specified counseling: Secondary | ICD-10-CM

## 2018-06-30 DIAGNOSIS — I129 Hypertensive chronic kidney disease with stage 1 through stage 4 chronic kidney disease, or unspecified chronic kidney disease: Secondary | ICD-10-CM

## 2018-06-30 DIAGNOSIS — C771 Secondary and unspecified malignant neoplasm of intrathoracic lymph nodes: Secondary | ICD-10-CM

## 2018-06-30 DIAGNOSIS — I700 Atherosclerosis of aorta: Secondary | ICD-10-CM

## 2018-06-30 DIAGNOSIS — I82621 Acute embolism and thrombosis of deep veins of right upper extremity: Secondary | ICD-10-CM

## 2018-06-30 DIAGNOSIS — Z7901 Long term (current) use of anticoagulants: Secondary | ICD-10-CM

## 2018-06-30 DIAGNOSIS — Z87891 Personal history of nicotine dependence: Secondary | ICD-10-CM

## 2018-06-30 DIAGNOSIS — K219 Gastro-esophageal reflux disease without esophagitis: Secondary | ICD-10-CM

## 2018-06-30 LAB — CBC WITH DIFFERENTIAL/PLATELET
Basophils Absolute: 0 10*3/uL (ref 0.0–0.1)
Basophils Relative: 0 %
Eosinophils Absolute: 0.1 10*3/uL (ref 0.0–0.5)
Eosinophils Relative: 1 %
HCT: 28.6 % — ABNORMAL LOW (ref 38.4–49.9)
Hemoglobin: 8.6 g/dL — ABNORMAL LOW (ref 13.0–17.1)
Lymphocytes Relative: 12 %
Lymphs Abs: 1.2 10*3/uL (ref 0.9–3.3)
MCH: 24.4 pg — ABNORMAL LOW (ref 27.2–33.4)
MCHC: 30.1 g/dL — ABNORMAL LOW (ref 32.0–36.0)
MCV: 81 fL (ref 79.3–98.0)
Monocytes Absolute: 1.2 10*3/uL — ABNORMAL HIGH (ref 0.1–0.9)
Monocytes Relative: 12 %
Neutro Abs: 7.3 10*3/uL — ABNORMAL HIGH (ref 1.5–6.5)
Neutrophils Relative %: 75 %
Platelets: 330 10*3/uL (ref 140–400)
RBC: 3.53 MIL/uL — ABNORMAL LOW (ref 4.20–5.82)
RDW: 20.6 % — ABNORMAL HIGH (ref 11.0–14.6)
WBC: 9.8 10*3/uL (ref 4.0–10.3)

## 2018-06-30 LAB — CMP (CANCER CENTER ONLY)
ALT: 21 U/L (ref 0–44)
AST: 29 U/L (ref 15–41)
Albumin: 2.7 g/dL — ABNORMAL LOW (ref 3.5–5.0)
Alkaline Phosphatase: 109 U/L (ref 38–126)
Anion gap: 13 (ref 5–15)
BUN: 14 mg/dL (ref 8–23)
CO2: 24 mmol/L (ref 22–32)
Calcium: 9.5 mg/dL (ref 8.9–10.3)
Chloride: 102 mmol/L (ref 98–111)
Creatinine: 0.99 mg/dL (ref 0.61–1.24)
GFR, Est AFR Am: >60 mL/min (ref >60)
GFR, Estimated: >60 mL/min (ref >60)
Glucose, Bld: 109 mg/dL — ABNORMAL HIGH (ref 70–99)
Potassium: 4.1 mmol/L (ref 3.5–5.1)
Sodium: 139 mmol/L (ref 135–145)
Total Bilirubin: 0.3 mg/dL (ref 0.3–1.2)
Total Protein: 7.6 g/dL (ref 6.5–8.1)

## 2018-06-30 LAB — URINALYSIS, COMPLETE (UACMP) WITH MICROSCOPIC
Bilirubin Urine: NEGATIVE
Glucose, UA: NEGATIVE mg/dL
Hgb urine dipstick: NEGATIVE
Ketones, ur: NEGATIVE mg/dL
Leukocytes, UA: NEGATIVE
Nitrite: NEGATIVE
Protein, ur: NEGATIVE mg/dL
Specific Gravity, Urine: 1.02 (ref 1.005–1.030)
pH: 5 (ref 5.0–8.0)

## 2018-06-30 LAB — MAGNESIUM: Magnesium: 2 mg/dL (ref 1.7–2.4)

## 2018-06-30 LAB — PREPARE RBC (CROSSMATCH)

## 2018-06-30 LAB — ABO/RH: ABO/RH(D): O POS

## 2018-06-30 MED ORDER — ACETAMINOPHEN 325 MG PO TABS
650.0000 mg | ORAL_TABLET | Freq: Once | ORAL | Status: AC
  Administered 2018-06-30: 650 mg via ORAL

## 2018-06-30 MED ORDER — FAMOTIDINE IN NACL 20-0.9 MG/50ML-% IV SOLN
20.0000 mg | Freq: Two times a day (BID) | INTRAVENOUS | Status: DC
  Administered 2018-06-30: 20 mg via INTRAVENOUS

## 2018-06-30 MED ORDER — SODIUM CHLORIDE 0.9 % IV SOLN
Freq: Once | INTRAVENOUS | Status: AC
  Administered 2018-06-30: 12:00:00 via INTRAVENOUS
  Filled 2018-06-30: qty 250

## 2018-06-30 MED ORDER — HYDROCODONE-ACETAMINOPHEN 5-325 MG PO TABS: 1.0000 | ORAL_TABLET | Freq: Four times a day (QID) | ORAL | 0 refills | Status: AC | PRN

## 2018-06-30 MED ORDER — METHYLPREDNISOLONE SODIUM SUCC 40 MG IJ SOLR
INTRAMUSCULAR | Status: AC
  Filled 2018-06-30: qty 1

## 2018-06-30 MED ORDER — SODIUM CHLORIDE 0.9 % IV SOLN
Freq: Once | INTRAVENOUS | Status: AC
  Administered 2018-06-30: 12:00:00 via INTRAVENOUS
  Filled 2018-06-30: qty 5

## 2018-06-30 MED ORDER — PALONOSETRON HCL INJECTION 0.25 MG/5ML
0.2500 mg | Freq: Once | INTRAVENOUS | Status: AC
  Administered 2018-06-30: 0.25 mg via INTRAVENOUS

## 2018-06-30 MED ORDER — METHYLPREDNISOLONE SODIUM SUCC 40 MG IJ SOLR
40.0000 mg | Freq: Once | INTRAMUSCULAR | Status: AC
  Administered 2018-06-30: 40 mg via INTRAVENOUS

## 2018-06-30 MED ORDER — SODIUM CHLORIDE 0.9 % IV SOLN
800.0000 mg/m2 | Freq: Once | INTRAVENOUS | Status: AC
  Administered 2018-06-30: 1710 mg via INTRAVENOUS
  Filled 2018-06-30: qty 44.97

## 2018-06-30 MED ORDER — MIRTAZAPINE 15 MG PO TABS: 15.0000 mg | ORAL_TABLET | Freq: Every day | ORAL | 0 refills | Status: AC

## 2018-06-30 MED ORDER — CARBOPLATIN CHEMO INJECTION 600 MG/60ML
450.0000 mg | Freq: Once | INTRAVENOUS | Status: AC
  Administered 2018-06-30: 450 mg via INTRAVENOUS
  Filled 2018-06-30: qty 45

## 2018-06-30 MED ORDER — ACETAMINOPHEN 325 MG PO TABS
ORAL_TABLET | ORAL | Status: AC
Start: 2018-06-30 — End: ?
  Filled 2018-06-30: qty 2

## 2018-06-30 MED ORDER — PALONOSETRON HCL INJECTION 0.25 MG/5ML
INTRAVENOUS | Status: AC
  Filled 2018-06-30: qty 5

## 2018-06-30 NOTE — Telephone Encounter (Signed)
Appointments scheduled AVS/Calendar printed per 7/24 los

## 2018-06-30 NOTE — Patient Instructions (Addendum)
[facility] Discharge Instructions for Patients Receiving Chemotherapy  Today you received the following chemotherapy agents Gemzar and Carboplatin   To help prevent nausea and vomiting after your treatment, we encourage you to take your nausea medication as directed.    If you develop nausea and vomiting that is not controlled by your nausea medication, call the clinic.   BELOW ARE SYMPTOMS THAT SHOULD BE REPORTED IMMEDIATELY:  *FEVER GREATER THAN 100.5 F  *CHILLS WITH OR WITHOUT FEVER  NAUSEA AND VOMITING THAT IS NOT CONTROLLED WITH YOUR NAUSEA MEDICATION  *UNUSUAL SHORTNESS OF BREATH  *UNUSUAL BRUISING OR BLEEDING  TENDERNESS IN MOUTH AND THROAT WITH OR WITHOUT PRESENCE OF ULCERS  *URINARY PROBLEMS  *BOWEL PROBLEMS  UNUSUAL RASH Items with * indicate a potential emergency and should be followed up as soon as possible.  Feel free to call the clinic should you have any questions or concerns. The clinic phone number is [phone].  Please show the [place] at check-in to the Emergency Department and triage nurse.   Blood Transfusion, Care After This sheet gives you information about how to care for yourself after your procedure. Your doctor may also give you more specific instructions. If you have problems or questions, contact your doctor. Follow these instructions at home:  Take over-the-counter and prescription medicines only as told by your doctor.  Go back to your normal activities as told by your doctor.  Follow instructions from your doctor about how to take care of the area where an IV tube was put into your vein (insertion site). Make sure you: ? Wash your hands with soap and water before you change your bandage (dressing). If there is no soap and water, use hand sanitizer. ? Change your bandage as told by your doctor.  Check your IV insertion site every day for signs of infection. Check for: ? More redness, swelling, or pain. ? More  fluid or blood. ? Warmth. ? Pus or a bad smell. Contact a doctor if:  You have more redness, swelling, or pain around the IV insertion site..  You have more fluid or blood coming from the IV insertion site.  Your IV insertion site feels warm to the touch.  You have pus or a bad smell coming from the IV insertion site.  Your pee (urine) turns pink, red, or brown.  You feel weak after doing your normal activities. Get help right away if:  You have signs of a serious allergic or body defense (immune) system reaction, including: ? Itchiness. ? Hives. ? Trouble breathing. ? Anxiety. ? Pain in your chest or lower back. ? Fever, flushing, and chills. ? Fast pulse. ? Rash. ? Watery poop (diarrhea). ? Throwing up (vomiting). ? Dark pee. ? Serious headache. ? Dizziness. ? Stiff neck. ? Yellow color in your face or the white parts of your eyes (jaundice). Summary  After a blood transfusion, return to your normal activities as told by your doctor.  Every day, check for signs of infection where the IV tube was put into your vein.  Some signs of infection are warm skin, more redness and pain, more fluid or blood, and pus or a bad smell where the needle went in.  Contact your doctor if you feel weak or have any unusual symptoms. This information is not intended to replace advice given to you by your health care provider. Make sure you discuss any questions you have with your health care provider. Document Released: 12/15/2014 Document  Revised: 07/18/2016 Document Reviewed: 07/18/2016 Elsevier Interactive Patient Education  2017 [employer].

## 2018-06-30 NOTE — Progress Notes (Signed)
Blood product started at 1335, at 1338 pt reported feeling dizzy and lightheaded. Hypersensitivity protocol started; blood paused and NS open to gravity immediately. Sandi Mealy, PA to tx area to see pt. See blood transfusion flowsheet and vital flowsheet for specific information. Pepcid given at 1542, pt monitored. Vitals remained stable.

## 2018-07-01 ENCOUNTER — Ambulatory Visit: Payer: Medicare Other

## 2018-07-01 ENCOUNTER — Ambulatory Visit: Payer: Medicare Other | Admitting: Hematology

## 2018-07-01 LAB — TRANSFUSION REACTION
DAT C3: NEGATIVE
Post RXN DAT IgG: NEGATIVE

## 2018-07-01 LAB — PREPARE RBC (CROSSMATCH)

## 2018-07-02 ENCOUNTER — Inpatient Hospital Stay: Payer: Medicare Other

## 2018-07-02 DIAGNOSIS — N189 Chronic kidney disease, unspecified: Secondary | ICD-10-CM | POA: Diagnosis not present

## 2018-07-02 DIAGNOSIS — C782 Secondary malignant neoplasm of pleura: Secondary | ICD-10-CM | POA: Diagnosis not present

## 2018-07-02 DIAGNOSIS — Z5111 Encounter for antineoplastic chemotherapy: Secondary | ICD-10-CM | POA: Diagnosis not present

## 2018-07-02 DIAGNOSIS — C679 Malignant neoplasm of bladder, unspecified: Secondary | ICD-10-CM | POA: Diagnosis not present

## 2018-07-02 DIAGNOSIS — C771 Secondary and unspecified malignant neoplasm of intrathoracic lymph nodes: Secondary | ICD-10-CM | POA: Diagnosis not present

## 2018-07-02 DIAGNOSIS — M549 Dorsalgia, unspecified: Secondary | ICD-10-CM | POA: Diagnosis not present

## 2018-07-02 DIAGNOSIS — D649 Anemia, unspecified: Secondary | ICD-10-CM

## 2018-07-02 DIAGNOSIS — R05 Cough: Secondary | ICD-10-CM | POA: Diagnosis not present

## 2018-07-02 MED ORDER — ACETAMINOPHEN 325 MG PO TABS
ORAL_TABLET | ORAL | Status: AC
  Filled 2018-07-02: qty 2

## 2018-07-02 MED ORDER — ACETAMINOPHEN 325 MG PO TABS
650.0000 mg | ORAL_TABLET | Freq: Once | ORAL | Status: AC
  Administered 2018-07-02: 650 mg via ORAL

## 2018-07-02 MED ORDER — METHYLPREDNISOLONE SODIUM SUCC 40 MG IJ SOLR
INTRAMUSCULAR | Status: AC
  Filled 2018-07-02: qty 1

## 2018-07-02 MED ORDER — SODIUM CHLORIDE 0.9% IV SOLUTION
250.0000 mL | Freq: Once | INTRAVENOUS | Status: AC
  Administered 2018-07-02: 250 mL via INTRAVENOUS
  Filled 2018-07-02: qty 250

## 2018-07-02 MED ORDER — METHYLPREDNISOLONE SODIUM SUCC 40 MG IJ SOLR
40.0000 mg | Freq: Once | INTRAMUSCULAR | Status: AC
  Administered 2018-07-02: 40 mg via INTRAVENOUS

## 2018-07-02 NOTE — Progress Notes (Signed)
This provider was asked to see Jeremy Patrick while he was in the infusion receiving a unit of packed red blood cells.  The patient has never had a transfusion before.  He was premedicated with Tylenol 650 mg p.o. x1 and Solu-Medrol 40 mg IV.  He became dizzy shortly after starting his infusion.  His transfusion was stopped.  He was given Pepcid 20 mg IV x1.  His symptoms resolved.  Unfortunately he did not receive much of his transfusion.   He will return on Friday for 2 units of packed red blood cells.  This was discussed with Dr. Sullivan Lone.  He believes that the patient may not have had a true reaction to his transfusion and could simply have been experiencing dizziness secondary to his profound anemia.  Jeremy Patrick MHS, PA-C Physician Assistant

## 2018-07-02 NOTE — Patient Instructions (Signed)

## 2018-07-02 NOTE — Progress Notes (Signed)
Three IV attempts.  IV team called.  Pt complained of irritation at IV site.  Assessed by RNs Learta Codding & Clarise Cruz.  Line patent, no infiltration, phlebitis, or other issues present.  Rate slowed to 150 ml/hr, pt feels better.  Will go back up to normal rate of 200 ml/hr gradually.

## 2018-07-05 LAB — BPAM RBC
Blood Product Expiration Date: 201908192359
Blood Product Expiration Date: 201908192359
Blood Product Expiration Date: 201908212359
ISSUE DATE / TIME: 201907241515
ISSUE DATE / TIME: 201907260945
ISSUE DATE / TIME: 201907260945
Unit Type and Rh: 5100
Unit Type and Rh: 5100
Unit Type and Rh: 5100

## 2018-07-05 LAB — TYPE AND SCREEN
ABO/RH(D): O POS
Antibody Screen: NEGATIVE
Unit division: 0
Unit division: 0
Unit division: 0

## 2018-07-07 ENCOUNTER — Inpatient Hospital Stay: Payer: Medicare Other

## 2018-07-07 VITALS — BP 133/84 | HR 100 | Temp 98.7°F | Resp 18 | Wt 158.5 lb

## 2018-07-07 DIAGNOSIS — Z5111 Encounter for antineoplastic chemotherapy: Secondary | ICD-10-CM | POA: Diagnosis not present

## 2018-07-07 DIAGNOSIS — C771 Secondary and unspecified malignant neoplasm of intrathoracic lymph nodes: Secondary | ICD-10-CM | POA: Diagnosis not present

## 2018-07-07 DIAGNOSIS — R05 Cough: Secondary | ICD-10-CM | POA: Diagnosis not present

## 2018-07-07 DIAGNOSIS — M549 Dorsalgia, unspecified: Secondary | ICD-10-CM | POA: Diagnosis not present

## 2018-07-07 DIAGNOSIS — C679 Malignant neoplasm of bladder, unspecified: Secondary | ICD-10-CM | POA: Diagnosis not present

## 2018-07-07 DIAGNOSIS — C7800 Secondary malignant neoplasm of unspecified lung: Secondary | ICD-10-CM

## 2018-07-07 DIAGNOSIS — C782 Secondary malignant neoplasm of pleura: Secondary | ICD-10-CM | POA: Diagnosis not present

## 2018-07-07 DIAGNOSIS — C801 Malignant (primary) neoplasm, unspecified: Principal | ICD-10-CM

## 2018-07-07 DIAGNOSIS — N189 Chronic kidney disease, unspecified: Secondary | ICD-10-CM | POA: Diagnosis not present

## 2018-07-07 DIAGNOSIS — C799 Secondary malignant neoplasm of unspecified site: Secondary | ICD-10-CM

## 2018-07-07 DIAGNOSIS — Z7189 Other specified counseling: Secondary | ICD-10-CM

## 2018-07-07 LAB — CBC WITH DIFFERENTIAL/PLATELET
Basophils Absolute: 0.1 10*3/uL (ref 0.0–0.1)
Basophils Relative: 1 %
Eosinophils Absolute: 0 10*3/uL (ref 0.0–0.5)
Eosinophils Relative: 1 %
HCT: 32.1 % — ABNORMAL LOW (ref 38.4–49.9)
Hemoglobin: 10.3 g/dL — ABNORMAL LOW (ref 13.0–17.1)
Lymphocytes Relative: 16 %
Lymphs Abs: 1 10*3/uL (ref 0.9–3.3)
MCH: 26 pg — ABNORMAL LOW (ref 27.2–33.4)
MCHC: 32.1 g/dL (ref 32.0–36.0)
MCV: 81.1 fL (ref 79.3–98.0)
Monocytes Absolute: 0.4 10*3/uL (ref 0.1–0.9)
Monocytes Relative: 7 %
Neutro Abs: 4.4 10*3/uL (ref 1.5–6.5)
Neutrophils Relative %: 75 %
Platelets: 169 10*3/uL (ref 140–400)
RBC: 3.96 MIL/uL — ABNORMAL LOW (ref 4.20–5.82)
RDW: 20.5 % — ABNORMAL HIGH (ref 11.0–14.6)
WBC: 5.9 10*3/uL (ref 4.0–10.3)

## 2018-07-07 LAB — CMP (CANCER CENTER ONLY)
ALT: 46 U/L — ABNORMAL HIGH (ref 0–44)
AST: 35 U/L (ref 15–41)
Albumin: 2.6 g/dL — ABNORMAL LOW (ref 3.5–5.0)
Alkaline Phosphatase: 156 U/L — ABNORMAL HIGH (ref 38–126)
Anion gap: 11 (ref 5–15)
BUN: 16 mg/dL (ref 8–23)
CO2: 26 mmol/L (ref 22–32)
Calcium: 9.2 mg/dL (ref 8.9–10.3)
Chloride: 99 mmol/L (ref 98–111)
Creatinine: 1.02 mg/dL (ref 0.61–1.24)
GFR, Est AFR Am: >60 mL/min (ref >60)
GFR, Estimated: >60 mL/min (ref >60)
Glucose, Bld: 113 mg/dL — ABNORMAL HIGH (ref 70–99)
Potassium: 3.9 mmol/L (ref 3.5–5.1)
Sodium: 136 mmol/L (ref 135–145)
Total Bilirubin: 0.6 mg/dL (ref 0.3–1.2)
Total Protein: 7.1 g/dL (ref 6.5–8.1)

## 2018-07-07 LAB — MAGNESIUM: Magnesium: 1.8 mg/dL (ref 1.7–2.4)

## 2018-07-07 MED ORDER — PROCHLORPERAZINE MALEATE 10 MG PO TABS
ORAL_TABLET | ORAL | Status: AC
  Filled 2018-07-07: qty 1

## 2018-07-07 MED ORDER — GEMCITABINE HCL CHEMO INJECTION 1 GM/26.3ML
800.0000 mg/m2 | Freq: Once | INTRAVENOUS | Status: AC
  Administered 2018-07-07: 1710 mg via INTRAVENOUS
  Filled 2018-07-07: qty 44.97

## 2018-07-07 MED ORDER — SODIUM CHLORIDE 0.9 % IV SOLN
Freq: Once | INTRAVENOUS | Status: AC
  Administered 2018-07-07: 09:00:00 via INTRAVENOUS
  Filled 2018-07-07: qty 250

## 2018-07-07 MED ORDER — PROCHLORPERAZINE MALEATE 10 MG PO TABS
10.0000 mg | ORAL_TABLET | Freq: Once | ORAL | Status: AC
  Administered 2018-07-07: 10 mg via ORAL

## 2018-07-07 NOTE — Progress Notes (Signed)
Patient has had weight loss since last week. Leave gemcitabine at 1710 mg per Dr. Irene Limbo. Patient is already on reduced 800mg /m2 dose and he does not want to drop the dose further at this time.

## 2018-07-07 NOTE — Patient Instructions (Signed)
[facility] °Discharge Instructions for Patients Receiving Chemotherapy ° °Today you received the following chemotherapy agents Gemzar ° °To help prevent nausea and vomiting after your treatment, we encourage you to take your nausea medication as directed. °  °If you develop nausea and vomiting that is not controlled by your nausea medication, call the clinic.  ° °BELOW ARE SYMPTOMS THAT SHOULD BE REPORTED IMMEDIATELY: °· *FEVER GREATER THAN 100.5 F °· *CHILLS WITH OR WITHOUT FEVER °· NAUSEA AND VOMITING THAT IS NOT CONTROLLED WITH YOUR NAUSEA MEDICATION °· *UNUSUAL SHORTNESS OF BREATH °· *UNUSUAL BRUISING OR BLEEDING °· TENDERNESS IN MOUTH AND THROAT WITH OR WITHOUT PRESENCE OF ULCERS °· *URINARY PROBLEMS °· *BOWEL PROBLEMS °· UNUSUAL RASH °Items with * indicate a potential emergency and should be followed up as soon as possible. ° °Feel free to call the clinic should you have any questions or concerns. The clinic phone number is [phone]. ° °Please show the CHEMO ALERT CARD at check-in to the Emergency Department and triage nurse. ° ° °

## 2018-07-08 ENCOUNTER — Inpatient Hospital Stay: Payer: Medicare Other | Attending: Hematology

## 2018-07-08 VITALS — BP 122/69 | HR 111 | Temp 100.5°F | Resp 18

## 2018-07-08 DIAGNOSIS — I2510 Atherosclerotic heart disease of native coronary artery without angina pectoris: Secondary | ICD-10-CM | POA: Insufficient documentation

## 2018-07-08 DIAGNOSIS — C771 Secondary and unspecified malignant neoplasm of intrathoracic lymph nodes: Secondary | ICD-10-CM | POA: Diagnosis not present

## 2018-07-08 DIAGNOSIS — C782 Secondary malignant neoplasm of pleura: Secondary | ICD-10-CM | POA: Diagnosis not present

## 2018-07-08 DIAGNOSIS — R531 Weakness: Secondary | ICD-10-CM | POA: Diagnosis not present

## 2018-07-08 DIAGNOSIS — K219 Gastro-esophageal reflux disease without esophagitis: Secondary | ICD-10-CM | POA: Diagnosis not present

## 2018-07-08 DIAGNOSIS — Z7689 Persons encountering health services in other specified circumstances: Secondary | ICD-10-CM | POA: Diagnosis not present

## 2018-07-08 DIAGNOSIS — I82612 Acute embolism and thrombosis of superficial veins of left upper extremity: Secondary | ICD-10-CM | POA: Insufficient documentation

## 2018-07-08 DIAGNOSIS — R634 Abnormal weight loss: Secondary | ICD-10-CM | POA: Insufficient documentation

## 2018-07-08 DIAGNOSIS — Z7901 Long term (current) use of anticoagulants: Secondary | ICD-10-CM | POA: Diagnosis not present

## 2018-07-08 DIAGNOSIS — C7800 Secondary malignant neoplasm of unspecified lung: Secondary | ICD-10-CM

## 2018-07-08 DIAGNOSIS — R5383 Other fatigue: Secondary | ICD-10-CM | POA: Diagnosis not present

## 2018-07-08 DIAGNOSIS — Z86718 Personal history of other venous thrombosis and embolism: Secondary | ICD-10-CM | POA: Insufficient documentation

## 2018-07-08 DIAGNOSIS — Z87891 Personal history of nicotine dependence: Secondary | ICD-10-CM | POA: Diagnosis not present

## 2018-07-08 DIAGNOSIS — I82C12 Acute embolism and thrombosis of left internal jugular vein: Secondary | ICD-10-CM | POA: Insufficient documentation

## 2018-07-08 DIAGNOSIS — R05 Cough: Secondary | ICD-10-CM | POA: Diagnosis not present

## 2018-07-08 DIAGNOSIS — C7802 Secondary malignant neoplasm of left lung: Secondary | ICD-10-CM | POA: Insufficient documentation

## 2018-07-08 DIAGNOSIS — R630 Anorexia: Secondary | ICD-10-CM | POA: Diagnosis not present

## 2018-07-08 DIAGNOSIS — I129 Hypertensive chronic kidney disease with stage 1 through stage 4 chronic kidney disease, or unspecified chronic kidney disease: Secondary | ICD-10-CM | POA: Insufficient documentation

## 2018-07-08 DIAGNOSIS — Z79899 Other long term (current) drug therapy: Secondary | ICD-10-CM | POA: Diagnosis not present

## 2018-07-08 DIAGNOSIS — Z5112 Encounter for antineoplastic immunotherapy: Secondary | ICD-10-CM | POA: Diagnosis not present

## 2018-07-08 DIAGNOSIS — C679 Malignant neoplasm of bladder, unspecified: Secondary | ICD-10-CM

## 2018-07-08 DIAGNOSIS — Z7189 Other specified counseling: Secondary | ICD-10-CM

## 2018-07-08 DIAGNOSIS — N189 Chronic kidney disease, unspecified: Secondary | ICD-10-CM | POA: Diagnosis not present

## 2018-07-08 MED ORDER — PEGFILGRASTIM-CBQV 6 MG/0.6ML ~~LOC~~ SOSY
6.0000 mg | PREFILLED_SYRINGE | Freq: Once | SUBCUTANEOUS | Status: AC
Start: 2018-07-08 — End: 2018-07-08
  Administered 2018-07-08: 6 mg via SUBCUTANEOUS

## 2018-07-08 MED ORDER — PEGFILGRASTIM-CBQV 6 MG/0.6ML ~~LOC~~ SOSY
PREFILLED_SYRINGE | SUBCUTANEOUS | Status: AC
  Filled 2018-07-08: qty 0.6

## 2018-07-20 NOTE — Progress Notes (Signed)
HEMATOLOGY/ONCOLOGY CLINIC NOTE  Date of Service: 07/21/2018  Patient Care Team: Martinique, Betty G, MD as PCP - General (Family Medicine)  Raynelle Bring, MD as Urologist  CHIEF COMPLAINTS/PURPOSE OF CONSULTATION:  F/u for newly diagnosed Metastatic Urothelial bladder carcinoma   HISTORY OF PRESENTING ILLNESS:   Jeremy Patrick is a wonderful 72 y.o. male who has been referred to [place] by Dr Betty Martinique for evaluation and management of metastatic malignancy of unknown primary. He is accompanied today by his wife. The pt reports that he is doing well overall.   The pt reports that he first noticed hematuria two years ago. He has had multiple bladder tumors removed since then, with the last resection in February 2019. He denies knowledge of metastasis and has been followed by urology and hasn't been seen by medical oncology previously.  He notes that his hematuria has resolved. His last treatment with intravesical Mitomycin C was 2-3 months ago, soon after his last tumor resection.   He presented to the ED on 04/26/18 because he was having intractable coughing. He notes that he would cough so much that he began to throw up. He denies coughing up any blood and has brought up some clear phlegm. He first noticed his cough one month ago, and it worsened to the point that he presented to the ED a couple days ago.  He does endorse feeling sleepier in the last month, as he wasn't eating as well- which he describes as a change in the last month. He notes that he has lost 20-30 pounds in the last 2-3 months after beginning Mytomycin C. He felt chills and flu-like symptoms for 2 weeks after beginning Mitomycin C.   Of note prior to the patient's visit today, pt has had CT C/A/P completed on 04/26/18 in the ED with results revealing 1. Bilateral pulmonary nodules and masses, right greater than left are associated with right-sided pleural disease and small right pleural effusion. Metastatic disease in this  patient with history of bladder cancer would be a distinct consideration although thoracic primary not excluded. 2. Lower paraesophageal lymphadenopathy 3. Mild circumferential bladder wall thickening with a 10 mm apparent bladder wall nodule posteriorly on the left. 4. No evidence for metastatic disease in the abdomen or pelvis. 5. Cholelithiasis. 6.  Aortic Atherosclerois.   Most recent lab results (04/26/18) of CBC  is as follows: all values are WNL except for Hgb at 10.7, HCT at 36.4, MCH at 24.3, MCHC at 29.4, Monocytes Abs at 1.3k.  On review of systems, pt reports decreased appetite, weight loss, increased fatigue, productive cough, mild back pains, and denies back aches, head aches, fevers, chills, night sweats, changes in vision, difficulty swallowing, and any other symptoms.   On PMHx the pt reports chronic kidney disease, bladder cancer, hypertension, GERD. On Social Hx the pt reports frequent exposure to clorox and he works in Development worker, international aid.    INTERVAL HISTORY   Jeremy Patrick is here for follow up regarding his metastatic urothelial carcinoma and C4D1of his Gemcitabine/Carboplatin treatment. The patient's last visit with [place] was on 06/30/18. He is accompanied today by his fiance and son. The pt reports that he is doing well overall.   The pt reports that he is continuing to not eat as well as his baseline. He continues to lose weight and notes that he has consumed Boost about two times a day, though has been recommended to consume 4 a day. The pt notes that he  still doesn't eat well on the days when his appetite is strong.   The patient's son notes that the pt has been overall weak, and passed out for a little bit this morning. The pt's fiance notes that he is staying in bed most of the day.   He notes that his cough is much better and he is not having any abdomina pains.   Of note since the patient's last visit, pt has had PET/CT completed today 07/21/18 with results  revealing Overall evidence of progression of extensive pleural and parenchymal metastasis in the RIGHT hemithorax.  The large hypermetabolic RIGHT lung masses are decreased mildly in metabolic activity. However this size of lesions are  increased. This finding could be seen with immunotherapy.  Likewise there is evidence of increase in size of pleural metastasis with increased extension into the subcarinal nodal station although again the metabolic activity is slightly decreased. Activity of these lesions me remains intense consistent with active malignancy. Hypermetabolic metastatic pulmonary nodules in LEFT lung are stable.  Lab results today (07/21/18) of CBC w/diff, CMP, and Reticulocytes is as follows: all values are WNL except for WBC at 17.8k, RBC at 3.44, HGB at 9.1, HCT at 28.8, MCH at 26.4, MCHC at 31.5, RW at 19.9, PLT at 646k, ANC at 13.7k, Monocytes abs at 2.9k, CO2 at 21, Glucose at 114, BUN at 25, Creatinine at 1.48, Calcium at 8.7, Albumin at 2.5, AST at 50, GFR at 54. Magnesium 07/21/18 is WNL at 1.9  On review of systems, pt reports weak appetite, weight loss, not eating well, feeling tired, and denies cough, abdominal pains, problems passing urine, blood in the urine, chest pain, leg swelling, and any other symptoms.   MEDICAL HISTORY:  Past Medical History:  Diagnosis Date  . Cancer [facility])    bladder cancer  . GERD (gastroesophageal reflux disease)   . Hypertension     SURGICAL HISTORY: Past Surgical History:  Procedure Laterality Date  . BLADDER SURGERY    . CYSTOSCOPY N/A 04/06/2017   Procedure: CYSTOSCOPY;  Surgeon: Raynelle Bring, MD;  Location: [facility];  Service: Urology;  Laterality: N/A;  . CYSTOSCOPY W/ RETROGRADES N/A 01/28/2018   Procedure: CYSTOSCOPY WITH RETROGRADE PYELOGRAM;  Surgeon: Raynelle Bring, MD;  Location: [facility];  Service: Urology;  Laterality: N/A;  . CYSTOSCOPY W/ URETERAL STENT PLACEMENT N/A 10/08/2017   Procedure: CYSTOSCOPY WITH BILATERAL  RETROGRADE  PYELOGRAM/ LEFT URETERAL STENT PLACEMENT;  Surgeon: Raynelle Bring, MD;  Location: [facility];  Service: Urology;  Laterality: N/A;  . IR CV LINE INJECTION  06/23/2018  . IR IMAGING GUIDED PORT INSERTION  05/21/2018  . IR REMOVAL TUN ACCESS W/ PORT W/O FL MOD SED  06/24/2018  . TRANSURETHRAL RESECTION OF BLADDER TUMOR N/A 04/06/2017   Procedure: TRANSURETHRAL RESECTION OF BLADDER TUMOR (TURBT);  Surgeon: Raynelle Bring, MD;  Location: [facility];  Service: Urology;  Laterality: N/A;  . TRANSURETHRAL RESECTION OF BLADDER TUMOR N/A 10/08/2017   Procedure: TRANSURETHRAL RESECTION OF BLADDER TUMOR (TURBT);  Surgeon: Raynelle Bring, MD;  Location: [facility];  Service: Urology;  Laterality: N/A;  . TRANSURETHRAL RESECTION OF BLADDER TUMOR N/A 01/28/2018   Procedure: TRANSURETHRAL RESECTION OF BLADDER TUMOR (TURBT);  Surgeon: Raynelle Bring, MD;  Location: [facility];  Service: Urology;  Laterality: N/A;  GENERAL ANESTHESIA WITH PARALYSIS  . TRANSURETHRAL RESECTION OF BLADDER TUMOR WITH MITOMYCIN-C N/A 03/02/2017   Procedure: TRANSURETHRAL RESECTION OF BLADDER TUMOR/ EXAM UNDER ANESTHESIA/ WITH MITOMYCIN-C POST OPERATIVE;  Surgeon: Raynelle Bring, MD;  Location: [facility];  Service: Urology;  Laterality: N/A;    SOCIAL HISTORY: Social History   Socioeconomic History  . Marital status: Married    Spouse name: Not on file  . Number of children: 4  . Years of education: 10  . Highest education level: Not on file  Occupational History  . Occupation: Retired  Scientific laboratory technician  . Financial resource strain: Not on file  . Food insecurity:    Worry: Not on file    Inability: Not on file  . Transportation needs:    Medical: Not on file    Non-medical: Not on file  Tobacco Use  . Smoking status: Former Smoker    Packs/day: 0.15    Years: 10.00    Pack years: 1.50    Types: Cigarettes    Last attempt to quit: 1980    Years since quitting: 39.6  . Smokeless tobacco: Never Used  Substance and Sexual Activity  . Alcohol use:  Not Currently  . Drug use: No    Comment: "years ago"  . Sexual activity: Not on file  Lifestyle  . Physical activity:    Days per week: Not on file    Minutes per session: Not on file  . Stress: Not on file  Relationships  . Social connections:    Talks on phone: Not on file    Gets together: Not on file    Attends religious service: Not on file    Active member of club or organization: Not on file    Attends meetings of clubs or organizations: Not on file    Relationship status: Not on file  . Intimate partner violence:    Fear of current or ex partner: Not on file    Emotionally abused: Not on file    Physically abused: Not on file    Forced sexual activity: Not on file  Other Topics Concern  . Not on file  Social History Narrative   Fun/Hobby: Go to churc and travel.     FAMILY HISTORY: Family History  Problem Relation Age of Onset  . Hypertension Mother   . Stroke Mother   . Diabetes Mother   . Cataracts Mother   . Kidney disease Mother   . Hypertension Father   . Stroke Father   . Aneurysm Maternal Grandmother     ALLERGIES:  has No Known Allergies.  MEDICATIONS:  Current Outpatient Medications  Medication Sig Dispense Refill  . acetaminophen (TYLENOL) 500 MG tablet Take 1,000 mg by mouth daily as needed for moderate pain.     . benzonatate (TESSALON) 100 MG capsule Take 1 capsule (100 mg total) by mouth 3 (three) times daily as needed for cough. 30 capsule 0  . cefpodoxime (VANTIN) 100 MG tablet Take 1 tablet (100 mg total) by mouth 2 (two) times daily. (Patient not taking: Reported on 06/23/2018) 10 tablet 0  . Cyanocobalamin (VITAMIN B 12 PO) Take 1 tablet by mouth daily.    Marland Kitchen dexamethasone (DECADRON) 4 MG tablet Take 2 tablets (8 mg total) by mouth daily. Start the day after carboplatin chemotherapy for 2 days. 30 tablet 1  . diltiazem (CARDIZEM CD) 360 MG 24 hr capsule TAKE 1 CAPSULE BY MOUTH  DAILY. 90 capsule 3  . dronabinol (MARINOL) 5 MG capsule Take  1 capsule (5 mg total) by mouth 2 (two) times daily before a meal. 60 capsule 0  . enoxaparin (LOVENOX) 80 MG/0.8ML injection Inject 0.8 mLs (80 mg total) into the  skin every 12 (twelve) hours. Start taking 06/24/2018 at 6pm 60 Syringe 2  . Ferrous Gluconate (IRON 27 PO) Take 27 mg by mouth daily.    . Glucos-Chondroit-Hyaluron-MSM (GLUCOSAMINE CHONDROITIN JOINT PO) Take 1,500 mg by mouth daily.    Marland Kitchen guaiFENesin-dextromethorphan (ROBITUSSIN DM) 100-10 MG/5ML syrup Take 5 mLs by mouth 3 (three) times daily as needed for cough. (Patient not taking: Reported on 05/06/2018) 118 mL 0  . HYDROcodone-acetaminophen (NORCO/VICODIN) 5-325 MG tablet Take 1 tablet by mouth every 6 (six) hours as needed for moderate pain or severe pain. 30 tablet 0  . lidocaine-prilocaine (EMLA) cream Apply to affected area once 30 g 3  . LORazepam (ATIVAN) 0.5 MG tablet Take 1 tablet (0.5 mg total) by mouth every 6 (six) hours as needed (Nausea or vomiting). 30 tablet 0  . mirtazapine (REMERON) 15 MG tablet Take 1 tablet (15 mg total) by mouth at bedtime. 30 tablet 0  . ondansetron (ZOFRAN) 8 MG tablet Take 1 tablet (8 mg total) by mouth 2 (two) times daily as needed for refractory nausea / vomiting. Start on day 3 after carboplatin chemo. 30 tablet 1  . ondansetron (ZOFRAN-ODT) 4 MG disintegrating tablet Take 1 tablet (4 mg total) by mouth every 8 (eight) hours as needed for nausea or vomiting. 30 tablet 0  . phenazopyridine (PYRIDIUM) 200 MG tablet Take 1 tablet (200 mg total) by mouth 3 (three) times daily. (Patient not taking: Reported on 06/23/2018) 6 tablet 0  . prochlorperazine (COMPAZINE) 10 MG tablet Take 1 tablet (10 mg total) by mouth every 6 (six) hours as needed (Nausea or vomiting). 30 tablet 1  . ranitidine (ZANTAC) 300 MG tablet TAKE 1 TABLET BY MOUTH  EVERY MORNING. 90 tablet 3  . senna-docusate (SENNA S) 8.6-50 MG tablet Take 2 tablets by mouth at bedtime. 60 tablet 1  . sildenafil (VIAGRA) 50 MG tablet Take 1  tablet (50 mg total) by mouth daily as needed for erectile dysfunction. (Patient not taking: Reported on 05/29/2018) 20 tablet 3   No current facility-administered medications for this visit.     REVIEW OF SYSTEMS:   A 10+ POINT REVIEW OF SYSTEMS WAS OBTAINED including neurology, dermatology, psychiatry, cardiac, respiratory, lymph, extremities, GI, GU, Musculoskeletal, constitutional, breasts, reproductive, HEENT.  All pertinent positives are noted in the HPI.  All others are negative.   PHYSICAL EXAMINATION: ECOG PERFORMANCE STATUS: 1 - Symptomatic but completely ambulatory  VS reviewed  GENERAL:alert, in no acute distress and comfortable SKIN: no acute rashes, no significant lesions EYES: conjunctiva are pink and non-injected, sclera anicteric OROPHARYNX: MMM, no exudates, no oropharyngeal erythema or ulceration NECK: supple, no JVD LYMPH:  no palpable lymphadenopathy in the cervical, axillary or inguinal regions LUNGS: clear to auscultation b/l with normal respiratory effort HEART: regular rate & rhythm ABDOMEN:  normoactive bowel sounds , non tender, not distended. No palpable hepatosplenomegaly.  Extremity: no pedal edema PSYCH: alert & oriented x 3 with fluent speech NEURO: no focal motor/sensory deficits   LABORATORY DATA:  I have reviewed the data as listed  . CBC Latest Ref Rng & Units 07/21/2018 07/07/2018 06/30/2018  WBC 4.0 - 10.3 K/uL 17.8(H) 5.9 9.8  Hemoglobin 13.0 - 17.1 g/dL 9.1(L) 10.3(L) 8.6(L)  Hematocrit 38.4 - 49.9 % 28.8(L) 32.1(L) 28.6(L)  Platelets 140 - 400 K/uL 646(H) 169 330   . CBC    Component Value Date/Time   WBC 17.8 (H) 07/21/2018 0846   RBC 3.44 (L) 07/21/2018 0846   HGB 9.1 (L)  07/21/2018 0846   HCT 28.8 (L) 07/21/2018 0846   PLT 646 (H) 07/21/2018 0846   MCV 83.9 07/21/2018 0846   MCH 26.4 (L) 07/21/2018 0846   MCHC 31.5 (L) 07/21/2018 0846   RDW 19.9 (H) 07/21/2018 0846   LYMPHSABS 1.0 07/21/2018 0846   MONOABS 2.9 (H) 07/21/2018  0846   EOSABS 0.0 07/21/2018 0846   BASOSABS 0.2 (H) 07/21/2018 0846    . CMP Latest Ref Rng & Units 07/21/2018 07/07/2018 06/30/2018  Glucose 70 - 99 mg/dL 114(H) 113(H) 109(H)  BUN 8 - 23 mg/dL 25(H) 16 14  Creatinine 0.61 - 1.24 mg/dL 1.48(H) 1.02 0.99  Sodium 135 - 145 mmol/L 139 136 139  Potassium 3.5 - 5.1 mmol/L 4.2 3.9 4.1  Chloride 98 - 111 mmol/L 100 99 102  CO2 22 - 32 mmol/L 21(L) 26 24  Calcium 8.9 - 10.3 mg/dL 8.7(L) 9.2 9.5  Total Protein 6.5 - 8.1 g/dL 7.0 7.1 7.6  Total Bilirubin 0.3 - 1.2 mg/dL 0.3 0.6 0.3  Alkaline Phos 38 - 126 U/L 117 156(H) 109  AST 15 - 41 U/L 50(H) 35 29  ALT 0 - 44 U/L 32 46(H) 21    PATHOLOGY  DIAGNOSIS:  05/06/18 A. PLEURAL MASS, RIGHT; CT-GUIDED BIOPSY:  - METASTATIC HIGH-GRADE UROTHELIAL CARCINOMA. Comment:  The patient has a history of invasive high-grade urothelial carcinoma with glandular (10%) and small cell differentiation (no more than 5%) diagnosed in a TURBT specimen of the left bladder neck from March 2018 at an outside hospital.  Given the patient's history a limited panel of immunohistochemical  stains was performed with the following pattern of immunoreactivity:  GATA-3: Positive  CD56: Negative  TTF-1: Negative  Napsin: Negative  The immunohistochemical and morphologic findings are consistent with metastatic high-grade urothelial carcinoma. The slides from the outside  prior TURBT ([id number]) were requested and reviewed in conjunction with this case. While a glandular and small cell component was identified in the 2018 specimen those morphologic patterns are not identified in the current sample.  These findings were communicated to Dr. Irene Limbo via [facility] in basket on 05/10/2018. IHC slides were prepared by [facility] for Molecular Biology and  Pathology, [place], [place]. All controls stained appropriately. This test was developed and its performance characteristics determined  by [facility]. It has not been cleared or approved by the [place] Food  and Drug  Administration. The FDA does not require this test to go through  premarket FDA review. This test is used for clinical purposes. It should  not be regarded as investigational or for research. This laboratory is  certified under the Clinical Laboratory Improvement Amendments (CLIA) as  qualified to perform high complexity clinical laboratory testing.    01/28/18 Bx:    03/02/17 Bx:    CV Procedures:  06/23/18 [place] Vas Upper Extremities:     RADIOGRAPHIC STUDIES: I have personally reviewed the radiological images as listed and agreed with the findings in the report. Dg Chest 2 View  Result Date: 06/23/2018 CLINICAL DATA:  Fever and cough EXAM: CHEST - 2 VIEW COMPARISON:  04/26/2018 CXR and CT FINDINGS: New moderate right pleural effusion partially obscuring a dominant mass in the right lower lobe as well as the right hemidiaphragm and heart border. Previously noted pulmonary masses are redemonstrated though slightly more prominent in appearance. Right IJ central line catheter is seen in the proximal SVC. No frank bone destruction is identified. IMPRESSION: New moderate right pleural effusion partially obscuring a dominant mass in the  right lower lobe as well as the right hemidiaphragm and heart border. Subjectively, the previously noted bilateral pulmonary masses and nodules appear more prominent/larger in size. Electronically Signed   By: Ashley Royalty M.D.   On: 06/23/2018 22:20   Ir Removal [employer] W/ [place] W/o Fl Mod Sed  Result Date: 06/24/2018 CLINICAL DATA:  History of metastatic urothelial carcinoma with prior port placement on 05/21/2018. The port catheter has migrated superiorly into the right internal jugular vein with associated jugular thrombosis and probable occlusion at the base of the jugular vein. The port now requires removal. EXAM: REMOVAL OF IMPLANTED TUNNELED PORT-A-CATH MEDICATIONS: 2 g IV Ancef. The antibiotic was administered within 1 hour prior to the start of the  procedure. Moderate (conscious) sedation was employed during this procedure. A total of Versed 2.0 mg and Fentanyl 100 mcg was administered intravenously. Moderate Sedation Time: 21 minutes. The patient's level of consciousness and vital signs were monitored continuously by radiology nursing throughout the procedure under my direct supervision. PROCEDURE: The right chest Port-A-Cath site was prepped with chlorhexidine. A sterile gown and gloves were worn during the procedure. Local anesthesia was provided with 1% lidocaine. An incision was made overlying the Port-A-Cath with a #15 scalpel. Utilizing sharp and blunt dissection, the Port-A-Cath was removed. Portable cautery was utilized. Retention sutures were removed. The pocked was irrigated with sterile saline. Wound closure was performed with subcutaneous 3-0 Monocryl, subcuticular 4-0 Vicryl and Dermabond. The entire Port-A-Cath was removed successfully. IMPRESSION: Removal of implanted Port-A-Cath utilizing sharp and blunt dissection. The procedure was uncomplicated. Electronically Signed   By: Aletta Edouard M.D.   On: 06/24/2018 13:32   Ir Cv Line Injection  Result Date: 06/23/2018 INDICATION: 72 year old with metastatic urothelial cancer. Port-A-Cath was placed on 05/21/2018. Patient is complaining of right neck pain. EXAM: EVALUATION OF THE RIGHT CHEST PORT-A-CATH WITH LINE INJECTION Physician: Stephan Minister. Anselm Pancoast, MD FLUOROSCOPY TIME:  6 seconds, 24 mGy MEDICATIONS: Lovenox 80 mg subcutaneous CONTRAST:  3 mL Isovue-300 ANESTHESIA/SEDATION: None PROCEDURE: The right chest port was accessed using sterile technique. A small amount of blood was able to be aspirated. Fluoroscopy demonstrated that the catheter is now malpositioned within the right neck. Contrast injection demonstrated irregular filling of the right jugular vein. Ultrasound demonstrated thrombosis of the right jugular vein. Catheter was flushed with a small amount of heparinized saline. The port  access needle was removed. FINDINGS: Catheter has flipped up into the right jugular vein. Previously, the catheter was well positioned at the superior cavoatrial junction. In addition, there is thrombus within the right jugular vein. Inferior aspect of the right jugular vein appears to be occluded. COMPLICATIONS: None IMPRESSION: Port-A-Cath is malpositioned. The catheter is coiled within the right jugular vein. In addition, the right jugular vein contains thrombus. These findings were discussed with Dr. Irene Limbo. Patient was given a dose of subcutaneous Lovenox and was taken to vascular ultrasound for evaluation of additional DVT in the upper extremities. Patient is scheduled to return to [facility] on 06/24/2018 for removal of the right jugular Port-A-Cath and possible placement of a left-sided chest port. Electronically Signed   By: Markus Daft M.D.   On: 06/23/2018 17:20    ASSESSMENT & PLAN:  72 y.o. male with  1. Urothelial bladder Carcinoma metastatic to the lung/thoracic LN and rt pleura, Stage IV  h/o recurrent bladder cancer with one specimen showing possible focal muscularis invasion. Also he previously had an element of small cell component on his bladder tumor pathology  that would also have increased metastatic potential.   -04/26/18 CT C/A/P which revealed Bilateral pulmonary nodules and masses, right greater than left are associated with right-sided pleural disease and small right pleural effusion.  -CXR 02/27/17 did not reveal any lung masses -Pathology from March 2018 showed a small cell component and invasion into the urinary bladder muscle -PET from 05/10/18 which shows wide spread metastatic disease in the chest, including bilateral pulmonary parenchymal, thoracic nodal, and right-sided pleural disease. No evidence of extrathoracic metastatic disease. -Biopsy from 05/06/18 which shows evidence of metastatic high grade urothelial carcinoma. He has recurrent now high grade  metastatic disease to the lung.  MRI brain: No evidence of intracranial metastatic disease  2. Intractable cough from pulmonary metastases -On tessalon perles with some relief  PLAN:    - Remeron for sleep and appetite stimulation  -Offered a referral to counseling and our chaplain service; the pt would like to wait on this  -Discussed pt labwork today, 07/21/18; Creatinine bumped up to 1.48, Calcium dropped to 8.7. PLT increased to 646k, HGB dropped to 9.1 -Will order1L NS IVF today  -Will order one unit of PRBCs -Discussed that between the patient's significant weight loss of 37 pounds over two months, and increased weakness, he appears to be gaining little benefit from chemotherapy at this time -Discussed my concern that the pt is not tolerating chemotherapy well and I suggest switching to immunotherapy alone at this time. The pt agrees to this plan.  -Will cancel scheduled chemotherapy today -Will schedule Atezolizumab in one week -Stressed the importance of the pt eating much better and increasing his activity levels, and use Ensure as a meal supplement not replacement -Offered a referral to PT which the pt accepted -Provided supplemental information on Pembrolizumab  -Today's 07/21/18 PET/CT revealed Overall evidence of progression of extensive pleural and parenchymal metastasis in the RIGHT hemithorax.  The large hypermetabolic RIGHT lung masses are decreased mildly in metabolic activity. However this size of lesions are  increased. This finding could be seen with immunotherapy.  Likewise there is evidence of increase in size of pleural metastasis with increased extension into the subcarinal nodal station although again the metabolic activity is slightly decreased. Activity of these lesions me remains intense consistent with active malignancy. Hypermetabolic metastatic pulmonary nodules in LEFT lung are stable. -Will see the pt back in 2 week  3. Acute DVT in rt IJV and cephalic  vein. 06/23/18 Vas [place] Upper Extremity revealed Evidence of acute DVT in the internal jugular vein, brachial vein and cephalic vein. Ultrasound findings are unable to discriminate whether obstruction in the internal jugular vein is acute or chronic. No evidence of DVT or SVT on left side.     Plan:  -Continue Lovenox for the next 6-8 weeks, then will consider switching to a different blood thinner  -will hold off on port replacement. -will transition to NOAC after about 1 month if stable.   -Cancel all schedule scheduled future chemotherapy appointments and MD appointments current in system. -IVF and 1 unit of PRBC today -Schedule switch to Pembrolizumab Immunotherapy in 1 week with labs -RTC with Dr Irene Limbo with labs in 2 weeks (1 week after Pembrolizumab) for toxicity check     All of the patients questions were answered with apparent satisfaction. The patient knows to call the clinic with any problems, questions or concerns.  The total time spent in the appt was 25 minutes and more than 50% was on counseling and direct patient cares.  Sullivan Lone MD MS AAHIVMS [facility] Hematology/Oncology Physician [facility]  (Office):       [phone] (Work cell):  [phone] (Fax):           [phone]  07/21/2018 10:44 AM  I, Baldwin [place], am acting as a scribe for Dr. Irene Limbo  .I have reviewed the above documentation for accuracy and completeness, and I agree with the above. Brunetta Genera MD

## 2018-07-21 ENCOUNTER — Inpatient Hospital Stay (HOSPITAL_BASED_OUTPATIENT_CLINIC_OR_DEPARTMENT_OTHER): Payer: Medicare Other | Admitting: Hematology

## 2018-07-21 ENCOUNTER — Ambulatory Visit: Payer: Medicare Other

## 2018-07-21 ENCOUNTER — Other Ambulatory Visit: Payer: Self-pay

## 2018-07-21 ENCOUNTER — Inpatient Hospital Stay: Payer: Medicare Other | Admitting: Nutrition

## 2018-07-21 ENCOUNTER — Inpatient Hospital Stay: Payer: Medicare Other

## 2018-07-21 ENCOUNTER — Ambulatory Visit (HOSPITAL_COMMUNITY)
Admission: RE | Admit: 2018-07-21 | Discharge: 2018-07-21 | Disposition: A | Discharge status: Home / Self Care | Payer: Medicare Other | Source: Ambulatory Visit | Attending: Hematology | Admitting: Hematology

## 2018-07-21 ENCOUNTER — Telehealth: Payer: Self-pay | Admitting: Hematology

## 2018-07-21 VITALS — BP 117/68 | HR 108 | Temp 98.6°F | Resp 17 | Ht 72.0 in | Wt 149.2 lb

## 2018-07-21 DIAGNOSIS — R590 Localized enlarged lymph nodes: Secondary | ICD-10-CM | POA: Diagnosis not present

## 2018-07-21 DIAGNOSIS — Z87891 Personal history of nicotine dependence: Secondary | ICD-10-CM

## 2018-07-21 DIAGNOSIS — K219 Gastro-esophageal reflux disease without esophagitis: Secondary | ICD-10-CM

## 2018-07-21 DIAGNOSIS — Z9221 Personal history of antineoplastic chemotherapy: Secondary | ICD-10-CM | POA: Diagnosis not present

## 2018-07-21 DIAGNOSIS — C7910 Secondary malignant neoplasm of unspecified urinary organs: Secondary | ICD-10-CM

## 2018-07-21 DIAGNOSIS — C7800 Secondary malignant neoplasm of unspecified lung: Secondary | ICD-10-CM

## 2018-07-21 DIAGNOSIS — C3490 Malignant neoplasm of unspecified part of unspecified bronchus or lung: Secondary | ICD-10-CM | POA: Diagnosis not present

## 2018-07-21 DIAGNOSIS — R630 Anorexia: Secondary | ICD-10-CM

## 2018-07-21 DIAGNOSIS — C782 Secondary malignant neoplasm of pleura: Secondary | ICD-10-CM | POA: Insufficient documentation

## 2018-07-21 DIAGNOSIS — C771 Secondary and unspecified malignant neoplasm of intrathoracic lymph nodes: Secondary | ICD-10-CM

## 2018-07-21 DIAGNOSIS — I2510 Atherosclerotic heart disease of native coronary artery without angina pectoris: Secondary | ICD-10-CM

## 2018-07-21 DIAGNOSIS — R634 Abnormal weight loss: Secondary | ICD-10-CM

## 2018-07-21 DIAGNOSIS — Z79899 Other long term (current) drug therapy: Secondary | ICD-10-CM

## 2018-07-21 DIAGNOSIS — R05 Cough: Secondary | ICD-10-CM | POA: Diagnosis not present

## 2018-07-21 DIAGNOSIS — R918 Other nonspecific abnormal finding of lung field: Secondary | ICD-10-CM | POA: Insufficient documentation

## 2018-07-21 DIAGNOSIS — R531 Weakness: Secondary | ICD-10-CM

## 2018-07-21 DIAGNOSIS — R5383 Other fatigue: Secondary | ICD-10-CM

## 2018-07-21 DIAGNOSIS — I129 Hypertensive chronic kidney disease with stage 1 through stage 4 chronic kidney disease, or unspecified chronic kidney disease: Secondary | ICD-10-CM

## 2018-07-21 DIAGNOSIS — C679 Malignant neoplasm of bladder, unspecified: Secondary | ICD-10-CM | POA: Diagnosis present

## 2018-07-21 DIAGNOSIS — C7802 Secondary malignant neoplasm of left lung: Secondary | ICD-10-CM

## 2018-07-21 DIAGNOSIS — N189 Chronic kidney disease, unspecified: Secondary | ICD-10-CM

## 2018-07-21 DIAGNOSIS — Z86718 Personal history of other venous thrombosis and embolism: Secondary | ICD-10-CM

## 2018-07-21 DIAGNOSIS — R0602 Shortness of breath: Secondary | ICD-10-CM

## 2018-07-21 DIAGNOSIS — Z7901 Long term (current) use of anticoagulants: Secondary | ICD-10-CM

## 2018-07-21 DIAGNOSIS — Z5112 Encounter for antineoplastic immunotherapy: Secondary | ICD-10-CM | POA: Diagnosis not present

## 2018-07-21 LAB — CMP (CANCER CENTER ONLY)
ALT: 32 U/L (ref 0–44)
AST: 50 U/L — ABNORMAL HIGH (ref 15–41)
Albumin: 2.5 g/dL — ABNORMAL LOW (ref 3.5–5.0)
Alkaline Phosphatase: 117 U/L (ref 38–126)
Anion gap: 18 — ABNORMAL HIGH (ref 5–15)
BUN: 25 mg/dL — ABNORMAL HIGH (ref 8–23)
CO2: 21 mmol/L — ABNORMAL LOW (ref 22–32)
Calcium: 8.7 mg/dL — ABNORMAL LOW (ref 8.9–10.3)
Chloride: 100 mmol/L (ref 98–111)
Creatinine: 1.48 mg/dL — ABNORMAL HIGH (ref 0.61–1.24)
GFR, Est AFR Am: 54 mL/min — ABNORMAL LOW (ref >60)
GFR, Estimated: 46 mL/min — ABNORMAL LOW (ref >60)
Glucose, Bld: 114 mg/dL — ABNORMAL HIGH (ref 70–99)
Potassium: 4.2 mmol/L (ref 3.5–5.1)
Sodium: 139 mmol/L (ref 135–145)
Total Bilirubin: 0.3 mg/dL (ref 0.3–1.2)
Total Protein: 7 g/dL (ref 6.5–8.1)

## 2018-07-21 LAB — PREPARE RBC (CROSSMATCH)

## 2018-07-21 LAB — CBC WITH DIFFERENTIAL/PLATELET
Basophils Absolute: 0.2 10*3/uL — ABNORMAL HIGH (ref 0.0–0.1)
Basophils Relative: 1 %
Eosinophils Absolute: 0 10*3/uL (ref 0.0–0.5)
Eosinophils Relative: 0 %
HCT: 28.8 % — ABNORMAL LOW (ref 38.4–49.9)
Hemoglobin: 9.1 g/dL — ABNORMAL LOW (ref 13.0–17.1)
Lymphocytes Relative: 6 %
Lymphs Abs: 1 10*3/uL (ref 0.9–3.3)
MCH: 26.4 pg — ABNORMAL LOW (ref 27.2–33.4)
MCHC: 31.5 g/dL — ABNORMAL LOW (ref 32.0–36.0)
MCV: 83.9 fL (ref 79.3–98.0)
Monocytes Absolute: 2.9 10*3/uL — ABNORMAL HIGH (ref 0.1–0.9)
Monocytes Relative: 16 %
Neutro Abs: 13.7 10*3/uL — ABNORMAL HIGH (ref 1.5–6.5)
Neutrophils Relative %: 77 %
Platelets: 646 10*3/uL — ABNORMAL HIGH (ref 140–400)
RBC: 3.44 MIL/uL — ABNORMAL LOW (ref 4.20–5.82)
RDW: 19.9 % — ABNORMAL HIGH (ref 11.0–14.6)
WBC: 17.8 10*3/uL — ABNORMAL HIGH (ref 4.0–10.3)

## 2018-07-21 LAB — SAMPLE TO BLOOD BANK

## 2018-07-21 LAB — GLUCOSE, CAPILLARY: Glucose-Capillary: 150 mg/dL — ABNORMAL HIGH (ref 70–99)

## 2018-07-21 LAB — MAGNESIUM: Magnesium: 1.9 mg/dL (ref 1.7–2.4)

## 2018-07-21 MED ORDER — ACETAMINOPHEN 325 MG PO TABS
650.0000 mg | ORAL_TABLET | Freq: Once | ORAL | Status: AC
  Administered 2018-07-21: 650 mg via ORAL

## 2018-07-21 MED ORDER — ACETAMINOPHEN 325 MG PO TABS
ORAL_TABLET | ORAL | Status: AC
  Filled 2018-07-21: qty 2

## 2018-07-21 MED ORDER — FLUDEOXYGLUCOSE F - 18 (FDG) INJECTION
7.7000 | Freq: Once | INTRAVENOUS | Status: AC | PRN
  Administered 2018-07-21: 7.7 via INTRAVENOUS

## 2018-07-21 MED ORDER — DIPHENHYDRAMINE HCL 25 MG PO CAPS
ORAL_CAPSULE | ORAL | Status: AC
Start: 2018-07-21 — End: ?
  Filled 2018-07-21: qty 1

## 2018-07-21 MED ORDER — SODIUM CHLORIDE 0.9 % IV SOLN
INTRAVENOUS | Status: DC
  Administered 2018-07-21: 11:00:00 via INTRAVENOUS
  Filled 2018-07-21 (×2): qty 250

## 2018-07-21 MED ORDER — SODIUM CHLORIDE 0.9% IV SOLUTION
250.0000 mL | Freq: Once | INTRAVENOUS | Status: AC
  Administered 2018-07-21: 250 mL via INTRAVENOUS
  Filled 2018-07-21: qty 250

## 2018-07-21 MED ORDER — DIPHENHYDRAMINE HCL 25 MG PO CAPS
25.0000 mg | ORAL_CAPSULE | Freq: Once | ORAL | Status: AC
  Administered 2018-07-21: 25 mg via ORAL

## 2018-07-21 NOTE — Patient Instructions (Signed)
Blood Transfusion, Care After This sheet gives you information about how to care for yourself after your procedure. Your doctor may also give you more specific instructions. If you have problems or questions, contact your doctor. Follow these instructions at home:  Take over-the-counter and prescription medicines only as told by your doctor.  Go back to your normal activities as told by your doctor.  Follow instructions from your doctor about how to take care of the area where an IV tube was put into your vein (insertion site). Make sure you: ? Wash your hands with soap and water before you change your bandage (dressing). If there is no soap and water, use hand sanitizer. ? Change your bandage as told by your doctor.  Check your IV insertion site every day for signs of infection. Check for: ? More redness, swelling, or pain. ? More fluid or blood. ? Warmth. ? Pus or a bad smell. Contact a doctor if:  You have more redness, swelling, or pain around the IV insertion site..  You have more fluid or blood coming from the IV insertion site.  Your IV insertion site feels warm to the touch.  You have pus or a bad smell coming from the IV insertion site.  Your pee (urine) turns pink, red, or brown.  You feel weak after doing your normal activities. Get help right away if:  You have signs of a serious allergic or body defense (immune) system reaction, including: ? Itchiness. ? Hives. ? Trouble breathing. ? Anxiety. ? Pain in your chest or lower back. ? Fever, flushing, and chills. ? Fast pulse. ? Rash. ? Watery poop (diarrhea). ? Throwing up (vomiting). ? Dark pee. ? Serious headache. ? Dizziness. ? Stiff neck. ? Yellow color in your face or the white parts of your eyes (jaundice). Summary  After a blood transfusion, return to your normal activities as told by your doctor.  Every day, check for signs of infection where the IV tube was put into your vein.  Some signs of  infection are warm skin, more redness and pain, more fluid or blood, and pus or a bad smell where the needle went in.  Contact your doctor if you feel weak or have any unusual symptoms. This information is not intended to replace advice given to you by your health care provider. Make sure you discuss any questions you have with your health care provider. Document Released: 12/15/2014 Document Revised: 07/18/2016 Document Reviewed: 07/18/2016 Elsevier Interactive Patient Education  2017 [employer]    Dehydration, Adult Dehydration is when there is not enough fluid or water in your body. This happens when you lose more fluids than you take in. Dehydration can range from mild to very bad. It should be treated right away to keep it from getting very bad. Symptoms of mild dehydration may include:  Thirst.  Dry lips.  Slightly dry mouth.  Dry, warm skin.  Dizziness. Symptoms of moderate dehydration may include:  Very dry mouth.  Muscle cramps.  Dark pee (urine). Pee may be the color of tea.  Your body making less pee.  Your eyes making fewer tears.  Heartbeat that is uneven or faster than normal (palpitations).  Headache.  Light-headedness, especially when you stand up from sitting.  Fainting (syncope). Symptoms of very bad dehydration may include:  Changes in skin, such as: ? Cold and clammy skin. ? Blotchy (mottled) or pale skin. ? Skin that does not quickly return to normal after being lightly pinched and  let go (poor skin turgor).  Changes in body fluids, such as: ? Feeling very thirsty. ? Your eyes making fewer tears. ? Not sweating when body temperature is high, such as in hot weather. ? Your body making very little pee.  Changes in vital signs, such as: ? Weak pulse. ? Pulse that is more than 100 beats a minute when you are sitting still. ? Fast breathing. ? Low blood pressure.  Other changes, such as: ? Sunken eyes. ? Cold hands and  feet. ? Confusion. ? Lack of energy (lethargy). ? Trouble waking up from sleep. ? Short-term weight loss. ? Unconsciousness. Follow these instructions at home:  If told by your doctor, drink an ORS: ? Make an ORS by using instructions on the package. ? Start by drinking small amounts, about  cup (120 mL) every 5-10 minutes. ? Slowly drink more until you have had the amount that your doctor said to have.  Drink enough clear fluid to keep your pee clear or pale yellow. If you were told to drink an ORS, finish the ORS first, then start slowly drinking clear fluids. Drink fluids such as: ? Water. Do not drink only water by itself. Doing that can make the salt (sodium) level in your body get too low (hyponatremia). ? Ice chips. ? Fruit juice that you have added water to (diluted). ? Low-calorie sports drinks.  Avoid: ? Alcohol. ? Drinks that have a lot of sugar. These include high-calorie sports drinks, fruit juice that does not have water added, and soda. ? Caffeine. ? Foods that are greasy or have a lot of fat or sugar.  Take over-the-counter and prescription medicines only as told by your doctor.  Do not take salt tablets. Doing that can make the salt level in your body get too high (hypernatremia).  Eat foods that have minerals (electrolytes). Examples include bananas, oranges, potatoes, tomatoes, and spinach.  Keep all follow-up visits as told by your doctor. This is important. Contact a doctor if:  You have belly (abdominal) pain that: ? Gets worse. ? Stays in one area (localizes).  You have a rash.  You have a stiff neck.  You get angry or annoyed more easily than normal (irritability).  You are more sleepy than normal.  You have a harder time waking up than normal.  You feel: ? Weak. ? Dizzy. ? Very thirsty.  You have peed (urinated) only a small amount of very dark pee during 6-8 hours. Get help right away if:  You have symptoms of very bad  dehydration.  You cannot drink fluids without throwing up (vomiting).  Your symptoms get worse with treatment.  You have a fever.  You have a very bad headache.  You are throwing up or having watery poop (diarrhea) and it: ? Gets worse. ? Does not go away.  You have blood or something green (bile) in your throw-up.  You have blood in your poop (stool). This may cause poop to look black and tarry.  You have not peed in 6-8 hours.  You pass out (faint).  Your heart rate when you are sitting still is more than 100 beats a minute.  You have trouble breathing. This information is not intended to replace advice given to you by your health care provider. Make sure you discuss any questions you have with your health care provider. Document Released: 09/20/2009 Document Revised: 06/13/2016 Document Reviewed: 01/18/2016 Elsevier Interactive Patient Education  2018 [employer].

## 2018-07-21 NOTE — Telephone Encounter (Signed)
Gave patients son avs and calendar of upcoming aug appts.

## 2018-07-21 NOTE — Progress Notes (Signed)
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 Weight decreased and documented as 149.2 pounds on August 14 from 170 pounds July 10. Patient states sometimes he can eat and sometimes he cannot eat. He is sleeping a lot. He tries to drink boost plus at least 2 times daily.  Nutrition diagnosis: Unintended weight loss continues. Severe malnutrition continues.  Intervention: Educated patient on the importance of increasing oral intake to minimize fatigue and weight loss. Encourage patient to drink boost +3 times a day between meals. Encourage patient to consume foods at mealtimes however if he is unable to eat recommended drinking boost plus at mealtimes in addition to between meals. Provided coupons. Teach back method used.  Monitoring, evaluation, goals: Patient will work to increase calories and protein to minimize weight loss.  Next visit: To be scheduled as needed.  **Disclaimer: This note was dictated with voice recognition software. Similar sounding words can inadvertently be transcribed and this note may contain transcription errors which may not have been corrected upon publication of note.**

## 2018-07-21 NOTE — Progress Notes (Signed)
ON PATHWAY REGIMEN - Bladder  No Change  Continue With Treatment as Ordered.     A cycle is every 21 days:     Carboplatin      Gemcitabine   **Always confirm dose/schedule in your pharmacy ordering system**  Patient Characteristics: Metastatic Disease, First Line, No Prior Neoadjuvant/Adjuvant Therapy, Poor Renal Function (CrCl < 50 mL/min), Unknown PD-L1 Expression AJCC M Category: M1b AJCC N Category: NX AJCC T Category: TX Current evidence of distant metastases<= Yes AJCC 8 Stage Grouping: IVB Line of Therapy: First Line Prior Neoadjuvant/Adjuvant Therapy<= No Renal Function: Poor Renal Function (CrCl < 50 mL/min) PD-L1 Expression Status: Unknown PD-L1 Expression Intent of Therapy: Non-Curative / Palliative Intent, Discussed with Patient

## 2018-07-21 NOTE — Progress Notes (Signed)
DISCONTINUE ON PATHWAY REGIMEN - Bladder     A cycle is every 21 days:     Carboplatin      Gemcitabine   **Always confirm dose/schedule in your pharmacy ordering system**  REASON: Disease Progression PRIOR TREATMENT: [id number]: Carboplatin AUC=5 D1 + Gemcitabine 1,000 mg/m2 D1, 8 q21 Days for a Maximum of 6 Cycles TREATMENT RESPONSE: Partial Response (PR)  START ON PATHWAY REGIMEN - Bladder     A cycle is 21 days:     Pembrolizumab   **Always confirm dose/schedule in your pharmacy ordering system**  Patient Characteristics: Metastatic Disease, Second Line, FGFR2/FGFR3 Mutation Negative or Unknown AJCC M Category: M1b AJCC N Category: NX AJCC T Category: TX Current evidence of distant metastases<= Yes AJCC 8 Stage Grouping: IVB Line of Therapy: Second Line FGFR2/FGFR3 Mutation Status: Did Not Order Test Intent of Therapy: Non-Curative / Palliative Intent, Discussed with Patient

## 2018-07-22 ENCOUNTER — Telehealth: Payer: Self-pay

## 2018-07-22 LAB — BPAM RBC
Blood Product Expiration Date: 201909142359
ISSUE DATE / TIME: 201908141229
Unit Type and Rh: 5100

## 2018-07-22 LAB — TYPE AND SCREEN
ABO/RH(D): O POS
Antibody Screen: NEGATIVE
Unit division: 0

## 2018-07-22 NOTE — Telephone Encounter (Signed)
Checked off the [place], already completed by [employer]

## 2018-07-28 ENCOUNTER — Ambulatory Visit: Payer: Medicare Other | Admitting: Rehabilitation

## 2018-07-28 ENCOUNTER — Inpatient Hospital Stay: Payer: Medicare Other

## 2018-07-28 ENCOUNTER — Inpatient Hospital Stay (HOSPITAL_BASED_OUTPATIENT_CLINIC_OR_DEPARTMENT_OTHER): Payer: Medicare Other | Admitting: Hematology

## 2018-07-28 ENCOUNTER — Ambulatory Visit: Payer: Medicare Other

## 2018-07-28 ENCOUNTER — Other Ambulatory Visit: Payer: Self-pay

## 2018-07-28 ENCOUNTER — Encounter (HOSPITAL_COMMUNITY): Payer: Self-pay | Admitting: Emergency Medicine

## 2018-07-28 ENCOUNTER — Emergency Department (HOSPITAL_COMMUNITY): Payer: Medicare Other

## 2018-07-28 ENCOUNTER — Ambulatory Visit (HOSPITAL_BASED_OUTPATIENT_CLINIC_OR_DEPARTMENT_OTHER)
Admission: RE | Admit: 2018-07-28 | Discharge: 2018-07-28 | Disposition: A | Discharge status: Home / Self Care | Payer: Medicare Other | Source: Ambulatory Visit | Attending: Hematology | Admitting: Hematology

## 2018-07-28 ENCOUNTER — Other Ambulatory Visit: Payer: Medicare Other

## 2018-07-28 ENCOUNTER — Inpatient Hospital Stay (HOSPITAL_COMMUNITY)
Admission: EM | Admit: 2018-07-28 | Discharge: 2018-09-07 | DRG: 299 | Disposition: E | Payer: Medicare Other | Source: Ambulatory Visit | Attending: Internal Medicine | Admitting: Internal Medicine

## 2018-07-28 VITALS — BP 106/79 | HR 103 | Temp 98.0°F | Resp 17

## 2018-07-28 DIAGNOSIS — Z8249 Family history of ischemic heart disease and other diseases of the circulatory system: Secondary | ICD-10-CM | POA: Diagnosis not present

## 2018-07-28 DIAGNOSIS — E43 Unspecified severe protein-calorie malnutrition: Secondary | ICD-10-CM | POA: Diagnosis present

## 2018-07-28 DIAGNOSIS — R634 Abnormal weight loss: Secondary | ICD-10-CM | POA: Diagnosis not present

## 2018-07-28 DIAGNOSIS — Z833 Family history of diabetes mellitus: Secondary | ICD-10-CM

## 2018-07-28 DIAGNOSIS — M79651 Pain in right thigh: Secondary | ICD-10-CM

## 2018-07-28 DIAGNOSIS — R5383 Other fatigue: Secondary | ICD-10-CM

## 2018-07-28 DIAGNOSIS — I272 Pulmonary hypertension, unspecified: Secondary | ICD-10-CM | POA: Diagnosis present

## 2018-07-28 DIAGNOSIS — Z515 Encounter for palliative care: Secondary | ICD-10-CM | POA: Diagnosis not present

## 2018-07-28 DIAGNOSIS — Z7189 Other specified counseling: Secondary | ICD-10-CM

## 2018-07-28 DIAGNOSIS — E860 Dehydration: Secondary | ICD-10-CM | POA: Diagnosis present

## 2018-07-28 DIAGNOSIS — O2230 Deep phlebothrombosis in pregnancy, unspecified trimester: Secondary | ICD-10-CM | POA: Diagnosis present

## 2018-07-28 DIAGNOSIS — N390 Urinary tract infection, site not specified: Secondary | ICD-10-CM | POA: Diagnosis not present

## 2018-07-28 DIAGNOSIS — C679 Malignant neoplasm of bladder, unspecified: Secondary | ICD-10-CM

## 2018-07-28 DIAGNOSIS — R509 Fever, unspecified: Secondary | ICD-10-CM

## 2018-07-28 DIAGNOSIS — Z7689 Persons encountering health services in other specified circumstances: Secondary | ICD-10-CM | POA: Diagnosis not present

## 2018-07-28 DIAGNOSIS — M7989 Other specified soft tissue disorders: Secondary | ICD-10-CM

## 2018-07-28 DIAGNOSIS — C7800 Secondary malignant neoplasm of unspecified lung: Secondary | ICD-10-CM

## 2018-07-28 DIAGNOSIS — C782 Secondary malignant neoplasm of pleura: Secondary | ICD-10-CM

## 2018-07-28 DIAGNOSIS — Z87891 Personal history of nicotine dependence: Secondary | ICD-10-CM

## 2018-07-28 DIAGNOSIS — J90 Pleural effusion, not elsewhere classified: Secondary | ICD-10-CM

## 2018-07-28 DIAGNOSIS — C771 Secondary and unspecified malignant neoplasm of intrathoracic lymph nodes: Secondary | ICD-10-CM | POA: Diagnosis not present

## 2018-07-28 DIAGNOSIS — N183 Chronic kidney disease, stage 3 (moderate): Secondary | ICD-10-CM | POA: Diagnosis present

## 2018-07-28 DIAGNOSIS — J910 Malignant pleural effusion: Secondary | ICD-10-CM | POA: Diagnosis present

## 2018-07-28 DIAGNOSIS — I82409 Acute embolism and thrombosis of unspecified deep veins of unspecified lower extremity: Secondary | ICD-10-CM | POA: Diagnosis not present

## 2018-07-28 DIAGNOSIS — I10 Essential (primary) hypertension: Secondary | ICD-10-CM | POA: Diagnosis present

## 2018-07-28 DIAGNOSIS — C7802 Secondary malignant neoplasm of left lung: Secondary | ICD-10-CM

## 2018-07-28 DIAGNOSIS — R05 Cough: Secondary | ICD-10-CM

## 2018-07-28 DIAGNOSIS — Z7901 Long term (current) use of anticoagulants: Secondary | ICD-10-CM | POA: Diagnosis not present

## 2018-07-28 DIAGNOSIS — N189 Chronic kidney disease, unspecified: Secondary | ICD-10-CM

## 2018-07-28 DIAGNOSIS — R630 Anorexia: Secondary | ICD-10-CM | POA: Diagnosis not present

## 2018-07-28 DIAGNOSIS — Z86718 Personal history of other venous thrombosis and embolism: Secondary | ICD-10-CM

## 2018-07-28 DIAGNOSIS — I129 Hypertensive chronic kidney disease with stage 1 through stage 4 chronic kidney disease, or unspecified chronic kidney disease: Secondary | ICD-10-CM

## 2018-07-28 DIAGNOSIS — R531 Weakness: Secondary | ICD-10-CM | POA: Diagnosis not present

## 2018-07-28 DIAGNOSIS — D630 Anemia in neoplastic disease: Secondary | ICD-10-CM | POA: Diagnosis present

## 2018-07-28 DIAGNOSIS — R011 Cardiac murmur, unspecified: Secondary | ICD-10-CM | POA: Diagnosis not present

## 2018-07-28 DIAGNOSIS — Z79899 Other long term (current) drug therapy: Secondary | ICD-10-CM

## 2018-07-28 DIAGNOSIS — I509 Heart failure, unspecified: Secondary | ICD-10-CM

## 2018-07-28 DIAGNOSIS — Z841 Family history of disorders of kidney and ureter: Secondary | ICD-10-CM | POA: Diagnosis not present

## 2018-07-28 DIAGNOSIS — Z66 Do not resuscitate: Secondary | ICD-10-CM | POA: Diagnosis not present

## 2018-07-28 DIAGNOSIS — K219 Gastro-esophageal reflux disease without esophagitis: Secondary | ICD-10-CM

## 2018-07-28 DIAGNOSIS — C7910 Secondary malignant neoplasm of unspecified urinary organs: Secondary | ICD-10-CM

## 2018-07-28 DIAGNOSIS — C384 Malignant neoplasm of pleura: Secondary | ICD-10-CM | POA: Diagnosis not present

## 2018-07-28 DIAGNOSIS — R7989 Other specified abnormal findings of blood chemistry: Secondary | ICD-10-CM

## 2018-07-28 DIAGNOSIS — Z6820 Body mass index (BMI) 20.0-20.9, adult: Secondary | ICD-10-CM

## 2018-07-28 DIAGNOSIS — I82413 Acute embolism and thrombosis of femoral vein, bilateral: Secondary | ICD-10-CM | POA: Diagnosis not present

## 2018-07-28 DIAGNOSIS — R748 Abnormal levels of other serum enzymes: Secondary | ICD-10-CM | POA: Diagnosis not present

## 2018-07-28 DIAGNOSIS — R778 Other specified abnormalities of plasma proteins: Secondary | ICD-10-CM | POA: Diagnosis present

## 2018-07-28 DIAGNOSIS — N179 Acute kidney failure, unspecified: Secondary | ICD-10-CM | POA: Diagnosis not present

## 2018-07-28 DIAGNOSIS — I82C12 Acute embolism and thrombosis of left internal jugular vein: Secondary | ICD-10-CM

## 2018-07-28 DIAGNOSIS — I82491 Acute embolism and thrombosis of other specified deep vein of right lower extremity: Secondary | ICD-10-CM | POA: Insufficient documentation

## 2018-07-28 DIAGNOSIS — A4159 Other Gram-negative sepsis: Secondary | ICD-10-CM | POA: Diagnosis not present

## 2018-07-28 DIAGNOSIS — I82612 Acute embolism and thrombosis of superficial veins of left upper extremity: Secondary | ICD-10-CM

## 2018-07-28 DIAGNOSIS — Z5112 Encounter for antineoplastic immunotherapy: Secondary | ICD-10-CM | POA: Diagnosis not present

## 2018-07-28 DIAGNOSIS — I82411 Acute embolism and thrombosis of right femoral vein: Secondary | ICD-10-CM

## 2018-07-28 DIAGNOSIS — D473 Essential (hemorrhagic) thrombocythemia: Secondary | ICD-10-CM | POA: Diagnosis not present

## 2018-07-28 DIAGNOSIS — Z823 Family history of stroke: Secondary | ICD-10-CM | POA: Diagnosis not present

## 2018-07-28 DIAGNOSIS — A419 Sepsis, unspecified organism: Secondary | ICD-10-CM | POA: Diagnosis not present

## 2018-07-28 DIAGNOSIS — R918 Other nonspecific abnormal finding of lung field: Secondary | ICD-10-CM | POA: Diagnosis not present

## 2018-07-28 DIAGNOSIS — I82403 Acute embolism and thrombosis of unspecified deep veins of lower extremity, bilateral: Secondary | ICD-10-CM | POA: Diagnosis not present

## 2018-07-28 DIAGNOSIS — I2510 Atherosclerotic heart disease of native coronary artery without angina pectoris: Secondary | ICD-10-CM

## 2018-07-28 DIAGNOSIS — I82431 Acute embolism and thrombosis of right popliteal vein: Secondary | ICD-10-CM | POA: Insufficient documentation

## 2018-07-28 DIAGNOSIS — Z9889 Other specified postprocedural states: Secondary | ICD-10-CM

## 2018-07-28 LAB — CBC WITH DIFFERENTIAL/PLATELET
Basophils Absolute: 0.1 10*3/uL (ref 0.0–0.1)
Basophils Absolute: 0.1 10*3/uL (ref 0.0–0.1)
Basophils Relative: 0 %
Basophils Relative: 0 %
Eosinophils Absolute: 0 10*3/uL (ref 0.0–0.5)
Eosinophils Absolute: 0 10*3/uL (ref 0.0–0.7)
Eosinophils Relative: 0 %
Eosinophils Relative: 0 %
HCT: 29.8 % — ABNORMAL LOW (ref 38.4–49.9)
HCT: 39.4 % (ref 39.0–52.0)
Hemoglobin: 9.3 g/dL — ABNORMAL LOW (ref 13.0–17.1)
Hemoglobin: 12.2 g/dL — ABNORMAL LOW (ref 13.0–17.0)
Lymphocytes Relative: 8 %
Lymphocytes Relative: 12 %
Lymphs Abs: 2.3 10*3/uL (ref 0.9–3.3)
Lymphs Abs: 2.5 10*3/uL (ref 0.7–4.0)
MCH: 26.2 pg — ABNORMAL LOW (ref 27.2–33.4)
MCH: 26.6 pg (ref 26.0–34.0)
MCHC: 31.3 g/dL — ABNORMAL LOW (ref 32.0–36.0)
MCHC: 31 g/dL (ref 30.0–36.0)
MCV: 83.6 fL (ref 79.3–98.0)
MCV: 86 fL (ref 78.0–100.0)
Monocytes Absolute: 4.6 10*3/uL — ABNORMAL HIGH (ref 0.1–0.9)
Monocytes Absolute: 2 10*3/uL — ABNORMAL HIGH (ref 0.1–1.0)
Monocytes Relative: 17 %
Monocytes Relative: 10 %
Neutro Abs: 20.6 10*3/uL — ABNORMAL HIGH (ref 1.5–6.5)
Neutro Abs: 16.5 10*3/uL — ABNORMAL HIGH (ref 1.7–7.7)
Neutrophils Relative %: 75 %
Neutrophils Relative %: 78 %
Platelets: 653 10*3/uL — ABNORMAL HIGH (ref 140–400)
Platelets: 656 10*3/uL — ABNORMAL HIGH (ref 150–400)
RBC: 3.56 MIL/uL — ABNORMAL LOW (ref 4.20–5.82)
RBC: 4.58 MIL/uL (ref 4.22–5.81)
RDW: 19.4 % — ABNORMAL HIGH (ref 11.0–14.6)
RDW: 19.3 % — ABNORMAL HIGH (ref 11.5–15.5)
WBC: 27.6 10*3/uL — ABNORMAL HIGH (ref 4.0–10.3)
WBC: 21.1 10*3/uL — ABNORMAL HIGH (ref 4.0–10.5)

## 2018-07-28 LAB — CMP (CANCER CENTER ONLY)
ALT: 27 U/L (ref 0–44)
AST: 61 U/L — ABNORMAL HIGH (ref 15–41)
Albumin: 2.3 g/dL — ABNORMAL LOW (ref 3.5–5.0)
Alkaline Phosphatase: 125 U/L (ref 38–126)
Anion gap: 14 (ref 5–15)
BUN: 21 mg/dL (ref 8–23)
CO2: 24 mmol/L (ref 22–32)
Calcium: 9.1 mg/dL (ref 8.9–10.3)
Chloride: 102 mmol/L (ref 98–111)
Creatinine: 1.39 mg/dL — ABNORMAL HIGH (ref 0.61–1.24)
GFR, Est AFR Am: 58 mL/min — ABNORMAL LOW (ref >60)
GFR, Estimated: 50 mL/min — ABNORMAL LOW (ref >60)
Glucose, Bld: 179 mg/dL — ABNORMAL HIGH (ref 70–99)
Potassium: 4.5 mmol/L (ref 3.5–5.1)
Sodium: 140 mmol/L (ref 135–145)
Total Bilirubin: 0.6 mg/dL (ref 0.3–1.2)
Total Protein: 7.2 g/dL (ref 6.5–8.1)

## 2018-07-28 LAB — COMPREHENSIVE METABOLIC PANEL
ALT: 33 U/L (ref 0–44)
AST: 69 U/L — ABNORMAL HIGH (ref 15–41)
Albumin: 2.9 g/dL — ABNORMAL LOW (ref 3.5–5.0)
Alkaline Phosphatase: 120 U/L (ref 38–126)
Anion gap: 14 (ref 5–15)
BUN: 27 mg/dL — ABNORMAL HIGH (ref 8–23)
CO2: 29 mmol/L (ref 22–32)
Calcium: 9.3 mg/dL (ref 8.9–10.3)
Chloride: 101 mmol/L (ref 98–111)
Creatinine, Ser: 1.54 mg/dL — ABNORMAL HIGH (ref 0.61–1.24)
GFR calc Af Amer: 51 mL/min — ABNORMAL LOW (ref >60)
GFR calc non Af Amer: 44 mL/min — ABNORMAL LOW (ref >60)
Glucose, Bld: 132 mg/dL — ABNORMAL HIGH (ref 70–99)
Potassium: 4.5 mmol/L (ref 3.5–5.1)
Sodium: 144 mmol/L (ref 135–145)
Total Bilirubin: 0.6 mg/dL (ref 0.3–1.2)
Total Protein: 7.9 g/dL (ref 6.5–8.1)

## 2018-07-28 LAB — TSH: TSH: 4.034 u[IU]/mL (ref 0.320–4.118)

## 2018-07-28 LAB — MAGNESIUM: Magnesium: 2 mg/dL (ref 1.7–2.4)

## 2018-07-28 LAB — APTT: aPTT: 35 seconds (ref 24–36)

## 2018-07-28 LAB — TROPONIN I
Troponin I: 0.03 ng/mL (ref <0.03)
Troponin I: <0.03 ng/mL (ref <0.03)

## 2018-07-28 LAB — PROTIME-INR
INR: 1.14
Prothrombin Time: 14.5 seconds (ref 11.4–15.2)

## 2018-07-28 MED ORDER — SENNOSIDES-DOCUSATE SODIUM 8.6-50 MG PO TABS
2.0000 | ORAL_TABLET | Freq: Every day | ORAL | Status: DC
  Administered 2018-07-29 – 2018-08-06 (×9): 2 via ORAL
  Filled 2018-07-28 (×11): qty 2

## 2018-07-28 MED ORDER — MIRTAZAPINE 15 MG PO TABS
15.0000 mg | ORAL_TABLET | Freq: Every day | ORAL | Status: DC
  Administered 2018-07-29 – 2018-08-07 (×10): 15 mg via ORAL
  Filled 2018-07-28 (×9): qty 1

## 2018-07-28 MED ORDER — SODIUM CHLORIDE 0.9 % IV SOLN
Freq: Once | INTRAVENOUS | Status: AC
  Administered 2018-07-28: 11:00:00 via INTRAVENOUS
  Filled 2018-07-28: qty 250

## 2018-07-28 MED ORDER — SODIUM CHLORIDE 0.9 % IV SOLN
200.0000 mg | Freq: Once | INTRAVENOUS | Status: AC
  Administered 2018-07-28: 200 mg via INTRAVENOUS
  Filled 2018-07-28: qty 8

## 2018-07-28 MED ORDER — SODIUM CHLORIDE 0.9 % IV BOLUS
500.0000 mL | Freq: Once | INTRAVENOUS | Status: AC
  Administered 2018-07-28: 500 mL via INTRAVENOUS

## 2018-07-28 MED ORDER — ONDANSETRON HCL 4 MG/2ML IJ SOLN
4.0000 mg | Freq: Four times a day (QID) | INTRAMUSCULAR | Status: DC | PRN
  Administered 2018-08-01: 4 mg via INTRAVENOUS
  Filled 2018-07-28: qty 2

## 2018-07-28 MED ORDER — ONDANSETRON HCL 4 MG PO TABS: 4.0000 mg | ORAL_TABLET | Freq: Four times a day (QID) | ORAL | Status: DC | PRN

## 2018-07-28 MED ORDER — ACETAMINOPHEN 325 MG PO TABS
650.0000 mg | ORAL_TABLET | Freq: Four times a day (QID) | ORAL | Status: DC | PRN
  Administered 2018-07-31 – 2018-08-01 (×3): 650 mg via ORAL
  Filled 2018-07-28 (×3): qty 2

## 2018-07-28 MED ORDER — HYDROCODONE-ACETAMINOPHEN 5-325 MG PO TABS
1.0000 | ORAL_TABLET | ORAL | Status: DC | PRN
  Administered 2018-07-29 – 2018-07-30 (×2): 1 via ORAL
  Administered 2018-08-01: 2 via ORAL
  Administered 2018-08-03 – 2018-08-04 (×3): 1 via ORAL
  Administered 2018-08-06: 2 via ORAL
  Administered 2018-08-06: 1 via ORAL
  Filled 2018-07-28 (×3): qty 1
  Filled 2018-07-28 (×2): qty 2
  Filled 2018-07-28 (×3): qty 1

## 2018-07-28 MED ORDER — HEPARIN BOLUS VIA INFUSION
2500.0000 [IU] | Freq: Once | INTRAVENOUS | Status: AC
  Administered 2018-07-28: 2500 [IU] via INTRAVENOUS
  Filled 2018-07-28: qty 2500

## 2018-07-28 MED ORDER — SODIUM CHLORIDE 0.9 % IV SOLN
INTRAVENOUS | Status: DC | PRN
  Administered 2018-07-29: 100 mL/h via INTRAVENOUS

## 2018-07-28 MED ORDER — ACETAMINOPHEN 650 MG RE SUPP: 650.0000 mg | Freq: Four times a day (QID) | RECTAL | Status: DC | PRN

## 2018-07-28 MED ORDER — SODIUM CHLORIDE 0.9 % IV SOLN
INTRAVENOUS | Status: DC
  Administered 2018-07-28: 23:00:00 via INTRAVENOUS

## 2018-07-28 MED ORDER — HEPARIN (PORCINE) IN NACL 100-0.45 UNIT/ML-% IJ SOLN
1050.0000 [IU]/h | INTRAMUSCULAR | Status: DC
  Administered 2018-07-28: 1050 [IU]/h via INTRAVENOUS
  Filled 2018-07-28: qty 250

## 2018-07-28 MED ORDER — HEPARIN BOLUS VIA INFUSION
2000.0000 [IU] | Freq: Once | INTRAVENOUS | Status: DC
  Filled 2018-07-28: qty 2000

## 2018-07-28 NOTE — Progress Notes (Signed)
*  Preliminary Results* Bilateral lower extremity venous duplex completed. Bilateral lower extremities are positive for deep vein thrombosis in the femoral vein, popliteal vein and gastroc veins. There is no evidence of Baker's cyst bilaterally.  07/23/2018 3:22 PM Jinny Blossom Dawna Part

## 2018-07-28 NOTE — ED Triage Notes (Signed)
Patient BIB family, reports patient came from [place] with results of blood clot to right leg. Reports leg pain x3 days. States he had his first immunotherapy infusion today.

## 2018-07-28 NOTE — Progress Notes (Signed)
ANTICOAGULATION CONSULT NOTE - Initial Consult  Pharmacy Consult for heparin drip Indication: VTE treatment  No Known Allergies  Patient Measurements: Height: 6' (182.9 cm) Weight: 149 lb 4 oz (67.7 kg) IBW/kg (Calculated) : 77.6 Heparin Dosing Weight: TBW  Vital Signs: Temp: 97.6 F (36.4 C) (08/21 1523) Temp Source: Oral (08/21 1523) BP: 120/66 (08/21 2030) Pulse Rate: 101 (08/21 2030)  Labs: Recent Labs    07/27/2018 0847 07/20/2018 1708  HGB 9.3* 12.2*  HCT 29.8* 39.4  PLT 653* 656*  APTT  --  35  LABPROT  --  14.5  INR  --  1.14  CREATININE 1.39* 1.54*  TROPONINI  --  0.03*    Estimated Creatinine Clearance: 42.7 mL/min (A) (by C-G formula based on SCr of 1.54 mg/dL (H)).   Medical History: Past Medical History:  Diagnosis Date  . Cancer [facility])    bladder cancer  . GERD (gastroesophageal reflux disease)   . Hypertension    Assessment: Pharmacy consulted to dose and monitor heparin infusion in this 72 year old male for VTE treatment. 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 Patient was on enoxaparin 80 mg subcutaneously q12h PTA and is being converted to a heparin drip in preparation for thoracentesis.   Baseline labs ordered and resulted.  Today, 08/07/2018  Hgb 12.2  Plt 656  Last enoxaparin dose was @ 0600 on 07/08/2018  Goal of Therapy:  Heparin level 0.3-0.7 units/ml Monitor platelets by anticoagulation protocol: Yes   Plan:   Give 2500 units bolus x 1 (partial bolus as patient has been anticoagulated on enoxaparin)  Start heparin infusion at 1050 units/hr  Check anti-Xa level in 8 hours and daily while on heparin  Continue to monitor H&H and platelets  Monitor for any signs or symptoms of bleeding  Lenis Noon, PharmD, BCPS Clinical Pharmacist 07/13/2018,9:18 PM

## 2018-07-28 NOTE — Progress Notes (Signed)
HEMATOLOGY/ONCOLOGY CLINIC NOTE  Date of Service: 07/24/2018  Patient Care Team: Martinique, Betty G, MD as PCP - General (Family Medicine)  Raynelle Bring, MD as Urologist  CHIEF COMPLAINTS/PURPOSE OF CONSULTATION:  F/u for newly diagnosed Metastatic Urothelial bladder carcinoma   HISTORY OF PRESENTING ILLNESS:   Jeremy Patrick is a wonderful 72 y.o. male who has been referred to [place] by Dr Betty Martinique for evaluation and management of metastatic malignancy of unknown primary. He is accompanied today by his wife. The pt reports that he is doing well overall.   The pt reports that he first noticed hematuria two years ago. He has had multiple bladder tumors removed since then, with the last resection in February 2019. He denies knowledge of metastasis and has been followed by urology and hasn't been seen by medical oncology previously.  He notes that his hematuria has resolved. His last treatment with intravesical Mitomycin C was 2-3 months ago, soon after his last tumor resection.   He presented to the ED on 04/26/18 because he was having intractable coughing. He notes that he would cough so much that he began to throw up. He denies coughing up any blood and has brought up some clear phlegm. He first noticed his cough one month ago, and it worsened to the point that he presented to the ED a couple days ago.  He does endorse feeling sleepier in the last month, as he wasn't eating as well- which he describes as a change in the last month. He notes that he has lost 20-30 pounds in the last 2-3 months after beginning Mytomycin C. He felt chills and flu-like symptoms for 2 weeks after beginning Mitomycin C.   Of note prior to the patient's visit today, pt has had CT C/A/P completed on 04/26/18 in the ED with results revealing 1. Bilateral pulmonary nodules and masses, right greater than left are associated with right-sided pleural disease and small right pleural effusion. Metastatic disease in this  patient with history of bladder cancer would be a distinct consideration although thoracic primary not excluded. 2. Lower paraesophageal lymphadenopathy 3. Mild circumferential bladder wall thickening with a 10 mm apparent bladder wall nodule posteriorly on the left. 4. No evidence for metastatic disease in the abdomen or pelvis. 5. Cholelithiasis. 6.  Aortic Atherosclerois.   Most recent lab results (04/26/18) of CBC  is as follows: all values are WNL except for Hgb at 10.7, HCT at 36.4, MCH at 24.3, MCHC at 29.4, Monocytes Abs at 1.3k.  On review of systems, pt reports decreased appetite, weight loss, increased fatigue, productive cough, mild back pains, and denies back aches, head aches, fevers, chills, night sweats, changes in vision, difficulty swallowing, and any other symptoms.   On PMHx the pt reports chronic kidney disease, bladder cancer, hypertension, GERD. On Social Hx the pt reports frequent exposure to clorox and he works in Development worker, international aid.    INTERVAL HISTORY   Jeremy Patrick is here for follow up regarding his metastatic urothelial carcinoma and maintenance Pembrolizumab. The pt is accompanied today by his fiance and son. The patient's last visit with [place] was on 07/21/18. The pt reports that he is doing well overall.   The pt reports that he has been trying to eat better and has had some pain in his right leg, just above the knee. The pt notes that he will be having an outpatient consult next week. He notes that he has lost significant strength. His fiance  and son note that the pt is not eating well at all, sleeps most of the day, and is unable to walk under his own strength very well.  The pt's temperature has been regularly checked and there have not been concerns of fever. He notes that he has had some clear phlegm that he has coughed up, but denies much coughing.   Lab results today (07/13/2018) of CBC w/diff, CMP, and Reticulocytes is as follows: all values are WNL  except for WBC at 27.6k, RBC at 3.56, HGB at 9.3, HCT at 29.8, MCH at 26.2, MCHC at 31.3, RDW at 19.4, PLT at 653k, Monocytes abs at 4.6k, Glucose at 179, Creatinine at 1.39, Albumin at 2.3, AST at 61, GFR at 58. TSH 07/27/2018 is WNL at 4.034 Magnesium 07/14/2018 is WNL at 2.0  On review of systems, pt reports pain in the right thigh, coughing up phlegm, weight loss, and denies fevers, chills, difficulty breathing, CP, abdominal pains, leg swelling, difficulty urinating, abdominal pains, and any other symptoms.   MEDICAL HISTORY:  Past Medical History:  Diagnosis Date  . Cancer [facility])    bladder cancer  . GERD (gastroesophageal reflux disease)   . Hypertension     SURGICAL HISTORY: Past Surgical History:  Procedure Laterality Date  . BLADDER SURGERY    . CYSTOSCOPY N/A 04/06/2017   Procedure: CYSTOSCOPY;  Surgeon: Raynelle Bring, MD;  Location: [facility];  Service: Urology;  Laterality: N/A;  . CYSTOSCOPY W/ RETROGRADES N/A 01/28/2018   Procedure: CYSTOSCOPY WITH RETROGRADE PYELOGRAM;  Surgeon: Raynelle Bring, MD;  Location: [facility];  Service: Urology;  Laterality: N/A;  . CYSTOSCOPY W/ URETERAL STENT PLACEMENT N/A 10/08/2017   Procedure: CYSTOSCOPY WITH BILATERAL  RETROGRADE PYELOGRAM/ LEFT URETERAL STENT PLACEMENT;  Surgeon: Raynelle Bring, MD;  Location: [facility];  Service: Urology;  Laterality: N/A;  . IR CV LINE INJECTION  06/23/2018  . IR IMAGING GUIDED PORT INSERTION  05/21/2018  . IR REMOVAL TUN ACCESS W/ PORT W/O FL MOD SED  06/24/2018  . TRANSURETHRAL RESECTION OF BLADDER TUMOR N/A 04/06/2017   Procedure: TRANSURETHRAL RESECTION OF BLADDER TUMOR (TURBT);  Surgeon: Raynelle Bring, MD;  Location: [facility];  Service: Urology;  Laterality: N/A;  . TRANSURETHRAL RESECTION OF BLADDER TUMOR N/A 10/08/2017   Procedure: TRANSURETHRAL RESECTION OF BLADDER TUMOR (TURBT);  Surgeon: Raynelle Bring, MD;  Location: [facility];  Service: Urology;  Laterality: N/A;  . TRANSURETHRAL RESECTION OF BLADDER TUMOR N/A  01/28/2018   Procedure: TRANSURETHRAL RESECTION OF BLADDER TUMOR (TURBT);  Surgeon: Raynelle Bring, MD;  Location: [facility];  Service: Urology;  Laterality: N/A;  GENERAL ANESTHESIA WITH PARALYSIS  . TRANSURETHRAL RESECTION OF BLADDER TUMOR WITH MITOMYCIN-C N/A 03/02/2017   Procedure: TRANSURETHRAL RESECTION OF BLADDER TUMOR/ EXAM UNDER ANESTHESIA/ WITH MITOMYCIN-C POST OPERATIVE;  Surgeon: Raynelle Bring, MD;  Location: [facility];  Service: Urology;  Laterality: N/A;    SOCIAL HISTORY: Social History   Socioeconomic History  . Marital status: Married    Spouse name: Not on file  . Number of children: 4  . Years of education: 10  . Highest education level: Not on file  Occupational History  . Occupation: Retired  Scientific laboratory technician  . Financial resource strain: Not on file  . Food insecurity:    Worry: Not on file    Inability: Not on file  . Transportation needs:    Medical: Not on file    Non-medical: Not on file  Tobacco Use  . Smoking status: Former Smoker  Packs/day: 0.15    Years: 10.00    Pack years: 1.50    Types: Cigarettes    Last attempt to quit: 1980    Years since quitting: 39.6  . Smokeless tobacco: Never Used  Substance and Sexual Activity  . Alcohol use: Not Currently  . Drug use: No    Comment: "years ago"  . Sexual activity: Not on file  Lifestyle  . Physical activity:    Days per week: Not on file    Minutes per session: Not on file  . Stress: Not on file  Relationships  . Social connections:    Talks on phone: Not on file    Gets together: Not on file    Attends religious service: Not on file    Active member of club or organization: Not on file    Attends meetings of clubs or organizations: Not on file    Relationship status: Not on file  . Intimate partner violence:    Fear of current or ex partner: Not on file    Emotionally abused: Not on file    Physically abused: Not on file    Forced sexual activity: Not on file  Other Topics Concern  . Not on  file  Social History Narrative   Fun/Hobby: Go to churc and travel.     FAMILY HISTORY: Family History  Problem Relation Age of Onset  . Hypertension Mother   . Stroke Mother   . Diabetes Mother   . Cataracts Mother   . Kidney disease Mother   . Hypertension Father   . Stroke Father   . Aneurysm Maternal Grandmother     ALLERGIES:  has No Known Allergies.  MEDICATIONS:  Current Outpatient Medications  Medication Sig Dispense Refill  . acetaminophen (TYLENOL) 500 MG tablet Take 1,000 mg by mouth daily as needed for moderate pain.     . benzonatate (TESSALON) 100 MG capsule Take 1 capsule (100 mg total) by mouth 3 (three) times daily as needed for cough. 30 capsule 0  . cefpodoxime (VANTIN) 100 MG tablet Take 1 tablet (100 mg total) by mouth 2 (two) times daily. (Patient not taking: Reported on 06/23/2018) 10 tablet 0  . Cyanocobalamin (VITAMIN B 12 PO) Take 1 tablet by mouth daily.    Marland Kitchen diltiazem (CARDIZEM CD) 360 MG 24 hr capsule TAKE 1 CAPSULE BY MOUTH  DAILY. 90 capsule 3  . dronabinol (MARINOL) 5 MG capsule Take 1 capsule (5 mg total) by mouth 2 (two) times daily before a meal. 60 capsule 0  . enoxaparin (LOVENOX) 80 MG/0.8ML injection Inject 0.8 mLs (80 mg total) into the skin every 12 (twelve) hours. Start taking 06/24/2018 at 6pm 60 Syringe 2  . Ferrous Gluconate (IRON 27 PO) Take 27 mg by mouth daily.    . Glucos-Chondroit-Hyaluron-MSM (GLUCOSAMINE CHONDROITIN JOINT PO) Take 1,500 mg by mouth daily.    Marland Kitchen guaiFENesin-dextromethorphan (ROBITUSSIN DM) 100-10 MG/5ML syrup Take 5 mLs by mouth 3 (three) times daily as needed for cough. (Patient not taking: Reported on 05/06/2018) 118 mL 0  . HYDROcodone-acetaminophen (NORCO/VICODIN) 5-325 MG tablet Take 1 tablet by mouth every 6 (six) hours as needed for moderate pain or severe pain. 30 tablet 0  . mirtazapine (REMERON) 15 MG tablet Take 1 tablet (15 mg total) by mouth at bedtime. 30 tablet 0  . ondansetron (ZOFRAN-ODT) 4 MG  disintegrating tablet Take 1 tablet (4 mg total) by mouth every 8 (eight) hours as needed for nausea or vomiting. [place]  tablet 0  . phenazopyridine (PYRIDIUM) 200 MG tablet Take 1 tablet (200 mg total) by mouth 3 (three) times daily. (Patient not taking: Reported on 06/23/2018) 6 tablet 0  . ranitidine (ZANTAC) 300 MG tablet TAKE 1 TABLET BY MOUTH  EVERY MORNING. 90 tablet 3  . senna-docusate (SENNA S) 8.6-50 MG tablet Take 2 tablets by mouth at bedtime. 60 tablet 1  . sildenafil (VIAGRA) 50 MG tablet Take 1 tablet (50 mg total) by mouth daily as needed for erectile dysfunction. (Patient not taking: Reported on 05/29/2018) 20 tablet 3   No current facility-administered medications for this visit.     REVIEW OF SYSTEMS:   A 10+ POINT REVIEW OF SYSTEMS WAS OBTAINED including neurology, dermatology, psychiatry, cardiac, respiratory, lymph, extremities, GI, GU, Musculoskeletal, constitutional, breasts, reproductive, HEENT.  All pertinent positives are noted in the HPI.  All others are negative.   PHYSICAL EXAMINATION: ECOG PERFORMANCE STATUS: 1 - Symptomatic but completely ambulatory  VS reviewed  GENERAL:alert, in no acute distress and comfortable SKIN: no acute rashes, no significant lesions EYES: conjunctiva are pink and non-injected, sclera anicteric OROPHARYNX: MMM, no exudates, no oropharyngeal erythema or ulceration NECK: supple, no JVD LYMPH:  no palpable lymphadenopathy in the cervical, axillary or inguinal regions LUNGS: clear to auscultation b/l with normal respiratory effort HEART: regular rate & rhythm ABDOMEN:  normoactive bowel sounds , non tender, not distended. No palpable hepatosplenomegaly.  Extremity: no pedal edema PSYCH: alert & oriented x 3 with fluent speech NEURO: no focal motor/sensory deficits   LABORATORY DATA:  I have reviewed the data as listed  . CBC Latest Ref Rng & Units 08/05/2018 07/21/2018 07/07/2018  WBC 4.0 - 10.3 K/uL 27.6(H) 17.8(H) 5.9  Hemoglobin  13.0 - 17.1 g/dL 9.3(L) 9.1(L) 10.3(L)  Hematocrit 38.4 - 49.9 % 29.8(L) 28.8(L) 32.1(L)  Platelets 140 - 400 K/uL 653(H) 646(H) 169   . CBC    Component Value Date/Time   WBC 27.6 (H) 07/26/2018 0847   RBC 3.56 (L) 07/19/2018 0847   HGB 9.3 (L) 07/30/2018 0847   HCT 29.8 (L) 07/19/2018 0847   PLT 653 (H) 07/12/2018 0847   MCV 83.6 07/27/2018 0847   MCH 26.2 (L) 07/31/2018 0847   MCHC 31.3 (L) 07/23/2018 0847   RDW 19.4 (H) 08/01/2018 0847   LYMPHSABS 2.3 07/31/2018 0847   MONOABS 4.6 (H) 07/10/2018 0847   EOSABS 0.0 07/25/2018 0847   BASOSABS 0.1 08/03/2018 0847    . CMP Latest Ref Rng & Units 08/02/2018 07/21/2018 07/07/2018  Glucose 70 - 99 mg/dL 179(H) 114(H) 113(H)  BUN 8 - 23 mg/dL 21 25(H) 16  Creatinine 0.61 - 1.24 mg/dL 1.39(H) 1.48(H) 1.02  Sodium 135 - 145 mmol/L 140 139 136  Potassium 3.5 - 5.1 mmol/L 4.5 4.2 3.9  Chloride 98 - 111 mmol/L 102 100 99  CO2 22 - 32 mmol/L 24 21(L) 26  Calcium 8.9 - 10.3 mg/dL 9.1 8.7(L) 9.2  Total Protein 6.5 - 8.1 g/dL 7.2 7.0 7.1  Total Bilirubin 0.3 - 1.2 mg/dL 0.6 0.3 0.6  Alkaline Phos 38 - 126 U/L 125 117 156(H)  AST 15 - 41 U/L 61(H) 50(H) 35  ALT 0 - 44 U/L 27 32 46(H)    PATHOLOGY  DIAGNOSIS:  05/06/18 A. PLEURAL MASS, RIGHT; CT-GUIDED BIOPSY:  - METASTATIC HIGH-GRADE UROTHELIAL CARCINOMA. Comment:  The patient has a history of invasive high-grade urothelial carcinoma with glandular (10%) and small cell differentiation (no more than 5%) diagnosed in a TURBT specimen of  the left bladder neck from March 2018 at an outside hospital.  Given the patient's history a limited panel of immunohistochemical  stains was performed with the following pattern of immunoreactivity:  GATA-3: Positive  CD56: Negative  TTF-1: Negative  Napsin: Negative  The immunohistochemical and morphologic findings are consistent with metastatic high-grade urothelial carcinoma. The slides from the outside  prior TURBT ([id number]) were requested and  reviewed in conjunction with this case. While a glandular and small cell component was identified in the 2018 specimen those morphologic patterns are not identified in the current sample.  These findings were communicated to Dr. Irene Limbo via [facility] in [place] on 05/10/2018. IHC slides were prepared by [facility], [place], [place]. All controls stained appropriately. This test was developed and its performance characteristics determined  by [facility]. It has not been cleared or approved by the [place] Food and Drug  Administration. The FDA does not require this test to go through  premarket FDA review. This test is used for clinical purposes. It should  not be regarded as investigational or for research. This laboratory is  certified under the Clinical Laboratory Improvement Amendments (CLIA) as  qualified to perform high complexity clinical laboratory testing.    01/28/18 Bx:    03/02/17 Bx:    CV Procedures:  06/23/18 [place] Vas Upper Extremities:     RADIOGRAPHIC STUDIES: I have personally reviewed the radiological images as listed and agreed with the findings in the report. Nm Pet Image Restag (ps) Skull Base To Thigh  Result Date: 07/21/2018 CLINICAL DATA:  Small cell lung cancer treatment strategy for metastatic bladder cancer. EXAM: NUCLEAR MEDICINE PET SKULL BASE TO THIGH TECHNIQUE: 7.7 mCi F-18 FDG was injected intravenously. Full-ring PET imaging was performed from the skull base to thigh after the radiotracer. CT data was obtained and used for attenuation correction and anatomic localization. Fasting blood glucose: 150 mg/dl COMPARISON:  None. FINDINGS: Mediastinal blood pool activity: SUV max 2.7 NECK: No hypermetabolic lymph nodes in the neck. Incidental CT findings: none CHEST: Multiple hypermetabolic nodule pleural metastasis in the RIGHT hemithorax again demonstrated. The overall involvement of the pleural space extending into the RIGHT hilum and RIGHT subcarinal nodal  station appears increased compared to prior. Example activity subcarinal lymph nodes are increased in volume with SUV max equal 7.8 compared to SUV max equal 5.5. Two round masses in the RIGHT upper lobe have decreased metabolic activity and mild increase in size. For example RIGHT upper lobe mass measuring 3.5 cm SUV max equal 13.5 compares 3.2 cm mass with SUV max equal 21. RIGHT lower lobe mass measures 4.6 cm increased from 3.8 cm. Metabolic activity is more peripheral suggesting necrosis. RIGHT middle lobe mass measuring 4.7 cm compared to 4.4 cm. SUV max equal 13.7 compared 18.8. The pleural metastasis is slightly decreased in metabolic with activity SUV max equal 9.3 compared to SUV max equal 13. Pleural mass in the posterior RIGHT lung base measures 30 mm in thickness compared to 20 mm. Hypermetabolic nodules in the LEFT lung are similar with. For example LEFT lower lobe nodule measuring 2.1 cm compares to 2.1 cm with SUV max equal 6.9 compared to 6.3. Incidental CT findings: Choose one ABDOMEN/PELVIS: No abnormal hypermetabolic activity within the liver, pancreas, adrenal glands, or spleen. No hypermetabolic lymph nodes in the abdomen or pelvis. Incidental CT findings: none SKELETON: No focal hypermetabolic activity to suggest skeletal metastasis. Incidental CT findings: none IMPRESSION: 1. Overall evidence of progression of extensive  pleural and parenchymal metastasis in the RIGHT hemithorax. 2. The large hypermetabolic RIGHT lung masses are decreased mildly in metabolic activity. However this size of lesions are increased. This finding could be seen with immunotherapy. 3. Likewise there is evidence of increase in size of pleural metastasis with increased extension into the subcarinal nodal station although again the metabolic activity is slightly decreased. Activity of these lesions me remains intense consistent with active malignancy. 4. Hypermetabolic metastatic pulmonary nodules in LEFT lung are  stable. Electronically Signed   By: Suzy Bouchard M.D.   On: 07/21/2018 11:57    ASSESSMENT & PLAN:  72 y.o. male with  1. Urothelial bladder Carcinoma metastatic to the lung/thoracic LN and rt pleura, Stage IV  h/o recurrent bladder cancer with one specimen showing possible focal muscularis invasion. 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   -04/26/18 CT C/A/P which revealed Bilateral pulmonary nodules and masses, right greater than left are associated with right-sided pleural disease and small right pleural effusion.  -CXR 02/27/17 did not reveal any lung masses -Pathology from March 2018 showed a small cell component and invasion into the urinary bladder muscle -PET from 05/10/18 which shows wide spread metastatic disease in the chest, including bilateral pulmonary parenchymal, thoracic nodal, and right-sided pleural disease. No evidence of extrathoracic metastatic disease. -Biopsy from 05/06/18 which shows evidence of metastatic high grade urothelial carcinoma. He has recurrent now high grade metastatic disease to the lung.  MRI brain: No evidence of intracranial metastatic disease  07/21/18 PET/CT revealed Overall evidence of progression of extensive pleural and parenchymal metastasis in the RIGHT hemithorax.  The large hypermetabolic RIGHT lung masses are decreased mildly in metabolic activity. However this size of lesions are  increased. This finding could be seen with immunotherapy.  Likewise there is evidence of increase in size of pleural metastasis with increased extension into the subcarinal nodal station although again the metabolic activity is slightly decreased. Activity of these lesions me remains intense consistent with active malignancy. Hypermetabolic metastatic pulmonary nodules in LEFT lung are stable.  2. Intractable cough from pulmonary metastases -On tessalon perles with some  relief  PLAN:    -Patient developed significant weight loss of 37 pounds over two months, increased weakness, and appeared to be gaining little benefit from chemotherapy after three cycles, switched to Pembrolizumab   -Discussed pt labwork today, 07/11/2018; ANC at 20.6k, HGB stable at 9.3, PLT at 653k  -Discussed the 07/21/18 PET/CT results with the pt -Will refer the pt urgently to home health care  -US guided thoracentesis -[place] VAS of RLE due to RE pain -Continued to stress the critical importance of the pt eating more   3. Acute DVT in rt IJV and cephalic vein. 06/23/18 Vas [place] Upper Extremity revealed Evidence of acute DVT in the internal jugular vein, brachial vein and cephalic vein. Ultrasound findings are unable to discriminate whether obstruction in the internal jugular vein is acute or chronic. No evidence of DVT or SVT on left side.     Plan:  -continue anticoagulation --[place] VAS of RLE due to RE pain   -Referral to advanced home care for home care services and PT/OT -[place] rt LE to r/o DVT today/tommorrow -US guided rt thoracentesis tomorrow -RTC with Dr Irene Limbo with labs as scheduled in 1 week    All of the patients questions were answered with apparent satisfaction. The patient knows to call the clinic  with any problems, questions or concerns.  The total time spent in the appt was 25 minutes and more than 50% was on counseling and direct patient cares.    Sullivan Lone MD MS AAHIVMS [facility] Hematology/Oncology Physician [facility]  (Office):       [phone] (Work cell):  [phone] (Fax):           [phone]  07/10/2018 1:20 PM  I, Baldwin [place], am acting as a scribe for Dr. Irene Limbo  .I have reviewed the above documentation for accuracy and completeness, and I agree with the above. Brunetta Genera MD

## 2018-07-28 NOTE — ED Provider Notes (Signed)
[facility] DEPT Provider Note   CSN: [id number] Arrival date & time: 08/04/2018  1514     History   Chief Complaint Chief Complaint  Patient presents with  . Leg Pain    HPI Jeremy Patrick is a 72 y.o. male.  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  He is being treated with Lovenox injections for a right-sided IJ DVT.  States been having right lower extremity pain for the past 3 days.  No known injury.  Was seen by his oncologist and had ultrasound study today.  Study revealed bilateral DVTs from the femoral to the gastrocnemius veins.  Patient denies any chest pain or shortness of breath.  Has appointment scheduled for thoracentesis tomorrow. Past Medical History:  Diagnosis Date  . Cancer [facility])    bladder cancer  . GERD (gastroesophageal reflux disease)   . Hypertension     Patient Active Problem List   Diagnosis Date Noted  . DVT (deep venous thrombosis) ([place]) 07/24/2018  . Elevated troponin 08/07/2018  . Pulmonary metastases ([place]) 05/18/2018  . Counseling regarding advance care planning and goals of care 05/18/2018  . Chronic rhinitis 04/06/2018  . Erectile dysfunction 04/06/2018  . Chronic left shoulder pain 01/21/2018  . Hepatitis C antibody test positive 04/23/2017  . Medicare annual wellness visit, subsequent 03/30/2017  . Routine adult health maintenance 03/30/2017  . Malignant neoplasm of urinary bladder ([place]) 03/30/2017  . Essential hypertension 03/03/2017  . Gastroesophageal reflux disease without esophagitis 03/03/2017    Past Surgical History:  Procedure Laterality Date  . BLADDER SURGERY    . CYSTOSCOPY N/A 04/06/2017   Procedure: CYSTOSCOPY;  Surgeon: Raynelle Bring, MD;  Location: [facility] ORS;  Service: Urology;  Laterality: N/A;  . CYSTOSCOPY W/ RETROGRADES N/A 01/28/2018   Procedure: CYSTOSCOPY WITH RETROGRADE PYELOGRAM;  Surgeon: Raynelle Bring, MD;  Location: [facility];  Service: Urology;   Laterality: N/A;  . CYSTOSCOPY W/ URETERAL STENT PLACEMENT N/A 10/08/2017   Procedure: CYSTOSCOPY WITH BILATERAL  RETROGRADE PYELOGRAM/ LEFT URETERAL STENT PLACEMENT;  Surgeon: Raynelle Bring, MD;  Location: [facility];  Service: Urology;  Laterality: N/A;  . IR CV LINE INJECTION  06/23/2018  . IR IMAGING GUIDED PORT INSERTION  05/21/2018  . IR REMOVAL TUN ACCESS W/ PORT W/O FL MOD SED  06/24/2018  . TRANSURETHRAL RESECTION OF BLADDER TUMOR N/A 04/06/2017   Procedure: TRANSURETHRAL RESECTION OF BLADDER TUMOR (TURBT);  Surgeon: Raynelle Bring, MD;  Location: [facility];  Service: Urology;  Laterality: N/A;  . TRANSURETHRAL RESECTION OF BLADDER TUMOR N/A 10/08/2017   Procedure: TRANSURETHRAL RESECTION OF BLADDER TUMOR (TURBT);  Surgeon: Raynelle Bring, MD;  Location: [facility];  Service: Urology;  Laterality: N/A;  . TRANSURETHRAL RESECTION OF BLADDER TUMOR N/A 01/28/2018   Procedure: TRANSURETHRAL RESECTION OF BLADDER TUMOR (TURBT);  Surgeon: Raynelle Bring, MD;  Location: [facility];  Service: Urology;  Laterality: N/A;  GENERAL ANESTHESIA WITH PARALYSIS  . TRANSURETHRAL RESECTION OF BLADDER TUMOR WITH MITOMYCIN-C N/A 03/02/2017   Procedure: TRANSURETHRAL RESECTION OF BLADDER TUMOR/ EXAM UNDER ANESTHESIA/ WITH MITOMYCIN-C POST OPERATIVE;  Surgeon: Raynelle Bring, MD;  Location: [facility];  Service: Urology;  Laterality: N/A;        Home Medications    Prior to Admission medications   Medication Sig Start Date End Date Taking? Authorizing Provider  acetaminophen (TYLENOL) 500 MG tablet Take 1,000 mg by mouth daily as needed for moderate pain.    Yes [provider]  benzonatate (TESSALON)  100 MG capsule Take 1 capsule (100 mg total) by mouth 3 (three) times daily as needed for cough. 05/19/18  Yes Brunetta Genera, MD  Cyanocobalamin (VITAMIN B 12 PO) Take 1 tablet by mouth daily.   Yes [provider]  diltiazem (CARDIZEM CD) 360 MG 24 hr capsule TAKE 1 CAPSULE BY MOUTH  DAILY. 06/18/18  Yes  [place], Betty G, MD  enoxaparin (LOVENOX) 80 MG/0.8ML injection Inject 0.8 mLs (80 mg total) into the skin every 12 (twelve) hours. Start taking 06/24/2018 at 6pm 06/23/18  Yes Brunetta Genera, MD  Ferrous Gluconate (IRON 27 PO) Take 27 mg by mouth daily.   Yes [provider]  Glucos-Chondroit-Hyaluron-MSM (GLUCOSAMINE CHONDROITIN JOINT PO) Take 1,500 mg by mouth daily.   Yes [provider]  HYDROcodone-acetaminophen (NORCO/VICODIN) 5-325 MG tablet Take 1 tablet by mouth every 6 (six) hours as needed for moderate pain or severe pain. 06/30/18  Yes Brunetta Genera, MD  mirtazapine (REMERON) 15 MG tablet Take 1 tablet (15 mg total) by mouth at bedtime. 06/30/18  Yes Brunetta Genera, MD  ondansetron (ZOFRAN-ODT) 4 MG disintegrating tablet Take 1 tablet (4 mg total) by mouth every 8 (eight) hours as needed for nausea or vomiting. 04/28/18  Yes Brunetta Genera, MD  senna-docusate (SENNA S) 8.6-50 MG tablet Take 2 tablets by mouth at bedtime. 06/09/18  Yes Brunetta Genera, MD  cefpodoxime (VANTIN) 100 MG tablet Take 1 tablet (100 mg total) by mouth 2 (two) times daily. Patient not taking: Reported on 06/23/2018 06/09/18   Brunetta Genera, MD  dronabinol (MARINOL) 5 MG capsule Take 1 capsule (5 mg total) by mouth 2 (two) times daily before a meal. Patient not taking: Reported on 07/25/2018 05/19/18   Brunetta Genera, MD  guaiFENesin-dextromethorphan [facility] DM) 100-10 MG/5ML syrup Take 5 mLs by mouth 3 (three) times daily as needed for cough. Patient not taking: Reported on 07/14/2018 04/26/18   Davonna Belling, MD  phenazopyridine (PYRIDIUM) 200 MG tablet Take 1 tablet (200 mg total) by mouth 3 (three) times daily. Patient not taking: Reported on 08/03/2018 05/29/18   Tanna Furry, MD  ranitidine (ZANTAC) 300 MG tablet TAKE 1 TABLET BY MOUTH  EVERY MORNING. Patient not taking: Reported on 08/01/2018 06/18/18   [place], Betty G, MD  sildenafil (VIAGRA) 50 MG tablet  Take 1 tablet (50 mg total) by mouth daily as needed for erectile dysfunction. Patient not taking: Reported on 07/09/2018 04/20/18   [place], Betty G, MD  prochlorperazine (COMPAZINE) 10 MG tablet Take 1 tablet (10 mg total) by mouth every 6 (six) hours as needed (Nausea or vomiting). 05/18/18 07/21/18  Brunetta Genera, MD    Family History Family History  Problem Relation Age of Onset  . Hypertension Mother   . Stroke Mother   . Diabetes Mother   . Cataracts Mother   . Kidney disease Mother   . Hypertension Father   . Stroke Father   . Aneurysm Maternal Grandmother     Social History Social History   Tobacco Use  . Smoking status: Former Smoker    Packs/day: 0.15    Years: 10.00    Pack years: 1.50    Types: Cigarettes    Last attempt to quit: 1980    Years since quitting: 39.6  . Smokeless tobacco: Never Used  Substance Use Topics  . Alcohol use: Not Currently  . Drug use: No    Comment: "years ago"     Allergies  Patient has no known allergies.   Review of Systems Review of Systems  Constitutional: Positive for fatigue. Negative for chills and fever.  Eyes: Negative for visual disturbance.  Respiratory: Negative for shortness of breath.   Cardiovascular: Positive for leg swelling. Negative for chest pain.  Gastrointestinal: Negative for abdominal pain, constipation, diarrhea, nausea and vomiting.  Genitourinary: Negative for dysuria and flank pain.  Musculoskeletal: Positive for myalgias. Negative for back pain, neck pain and neck stiffness.  Skin: Negative for rash and wound.  Neurological: Negative for dizziness, weakness, light-headedness, numbness and headaches.  All other systems reviewed and are negative.    Physical Exam Updated Vital Signs BP 133/80 (BP Location: Right Arm)   Pulse 95   Temp 97.6 F (36.4 C) (Oral)   Resp 16   Ht 6' (1.829 m)   Wt 67.7 kg   SpO2 97%   BMI 20.24 kg/m   Physical Exam  Constitutional: He is oriented to  person, place, and time. He appears well-developed.  Dry appearing  HENT:  Head: Normocephalic and atraumatic.  Mouth/Throat: Oropharynx is clear and moist.  Eyes: Pupils are equal, round, and reactive to light. EOM are normal.  Neck: Normal range of motion. Neck supple. No JVD present.  Cardiovascular: Normal rate and regular rhythm. Exam reveals no gallop and no friction rub.  No murmur heard. Pulmonary/Chest: Effort normal.  Diminished breath sounds in right base.  Abdominal: Soft. Bowel sounds are normal. There is no tenderness. There is no rebound and no guarding.  Musculoskeletal: Normal range of motion. He exhibits edema and tenderness.  Right greater than left lower extremity swelling diffusely.  No obvious joint effusions.  Mild tenderness to palpation especially over the quadricep and calf.  Distal pulses are 2+.  Lymphadenopathy:    He has no cervical adenopathy.  Neurological: He is alert and oriented to person, place, and time.  Moving all extremities without focal deficit.  Sensation intact.  Skin: Skin is warm and dry. Capillary refill takes less than 2 seconds. No rash noted. No erythema.  Dry scaly skin  Psychiatric: He has a normal mood and affect. His behavior is normal.  Nursing note and vitals reviewed.    ED Treatments / Results  Labs (all labs ordered are listed, but only abnormal results are displayed) Labs Reviewed  CBC WITH DIFFERENTIAL/PLATELET - Abnormal; Notable for the following components:      Result Value   WBC 21.1 (*)    Hemoglobin 12.2 (*)    RDW 19.3 (*)    Platelets 656 (*)    Neutro Abs 16.5 (*)    Monocytes Absolute 2.0 (*)    All other components within normal limits  COMPREHENSIVE METABOLIC PANEL - Abnormal; Notable for the following components:   Glucose, Bld 132 (*)    BUN 27 (*)    Creatinine, Ser 1.54 (*)    Albumin 2.9 (*)    AST 69 (*)    GFR calc non Af Amer 44 (*)    GFR calc Af Amer 51 (*)    All other components within  normal limits  TROPONIN I - Abnormal; Notable for the following components:   Troponin I 0.03 (*)    All other components within normal limits  PROTIME-INR  APTT  TROPONIN I  TROPONIN I    EKG None  Radiology Dg Chest 2 View  Result Date: 08/05/2018 CLINICAL DATA:  Metastatic bladder CA EXAM: CHEST - 2 VIEW COMPARISON:  07/21/2018 FINDINGS: Cardiac shadow  is stable. Multiple too numerous to number pulmonary metastatic lesions are identified. Associated right-sided pleural effusion and right lower lobe consolidation is seen similar to that noted on prior exam IMPRESSION: Changes consistent with known pulmonary metastatic disease. The overall appearance is similar to that seen on recent PET-CT. Electronically Signed   By: Inez Catalina M.D.   On: 07/19/2018 17:47    Procedures Procedures (including critical care time)  Medications Ordered in ED Medications  0.9 %  sodium chloride infusion (has no administration in time range)  sodium chloride 0.9 % bolus 500 mL (500 mLs Intravenous New Bag/Given 08/04/2018 1715)     Initial Impression / Assessment and Plan / ED Course  I have reviewed the triage vital signs and the nursing notes.  Pertinent labs & imaging results that were available during my care of the patient were reviewed by me and considered in my medical decision making (see chart for details).    Spoke with oncology.  Recommend heparin drip.  Hospitalist to admit.  Final Clinical Impressions(s) / ED Diagnoses   Final diagnoses:  Acute bilateral deep vein thrombosis (DVT) of femoral veins ([place])  AKI (acute kidney injury) [facility])    ED Discharge Orders    None       Julianne Rice, MD 07/31/2018 1858

## 2018-07-28 NOTE — Progress Notes (Signed)
Pt gave permission for his wife, daughter, and son to remain in the room while the questions were asked on the nursing admission hx. Lucius Conn BSN, RN-BC Admissions RN 08/05/2018 6:24 PM

## 2018-07-28 NOTE — Patient Instructions (Signed)
Pembrolizumab injection  What is this medicine?  PEMBROLIZUMAB (pem broe liz ue mab) is a monoclonal antibody. It is used to treat melanoma, head and neck cancer, Hodgkin lymphoma, non-small cell lung cancer, urothelial cancer, stomach cancer, and cancers that have a certain genetic condition.  This medicine may be used for other purposes; ask your health care provider or pharmacist if you have questions.  COMMON BRAND NAME(S): Keytruda  What should I tell my health care provider before I take this medicine?  They need to know if you have any of these conditions:  -diabetes  -immune system problems  -inflammatory bowel disease  -liver disease  -lung or breathing disease  -lupus  -organ transplant  -an unusual or allergic reaction to pembrolizumab, other medicines, foods, dyes, or preservatives  -pregnant or trying to get pregnant  -breast-feeding  How should I use this medicine?  This medicine is for infusion into a vein. It is given by a health care professional in a hospital or clinic setting.  A special MedGuide will be given to you before each treatment. Be sure to read this information carefully each time.  Talk to your pediatrician regarding the use of this medicine in children. While this drug may be prescribed for selected conditions, precautions do apply.  Overdosage: If you think you have taken too much of this medicine contact a poison control center or emergency room at once.  NOTE: This medicine is only for you. Do not share this medicine with others.  What if I miss a dose?  It is important not to miss your dose. Call your doctor or health care professional if you are unable to keep an appointment.  What may interact with this medicine?  Interactions have not been studied.  Give your health care provider a list of all the medicines, herbs, non-prescription drugs, or dietary supplements you use. Also tell them if you smoke, drink alcohol, or use illegal drugs. Some items may interact with your  medicine.  This list may not describe all possible interactions. Give your health care provider a list of all the medicines, herbs, non-prescription drugs, or dietary supplements you use. Also tell them if you smoke, drink alcohol, or use illegal drugs. Some items may interact with your medicine.  What should I watch for while using this medicine?  Your condition will be monitored carefully while you are receiving this medicine.  You may need blood work done while you are taking this medicine.  Do not become pregnant while taking this medicine or for 4 months after stopping it. Women should inform their doctor if they wish to become pregnant or think they might be pregnant. There is a potential for serious side effects to an unborn child. Talk to your health care professional or pharmacist for more information. Do not breast-feed an infant while taking this medicine or for 4 months after the last dose.  What side effects may I notice from receiving this medicine?  Side effects that you should report to your doctor or health care professional as soon as possible:  -allergic reactions like skin rash, itching or hives, swelling of the face, lips, or tongue  -bloody or black, tarry  -breathing problems  -changes in vision  -chest pain  -chills  -constipation  -cough  -dizziness or feeling faint or lightheaded  -fast or irregular heartbeat  -fever  -flushing  -hair loss  -low blood counts - this medicine may decrease the number of white blood cells, red blood cells   and platelets. You may be at increased risk for infections and bleeding.  -muscle pain  -muscle weakness  -persistent headache  -signs and symptoms of high blood sugar such as dizziness; dry mouth; dry skin; fruity breath; nausea; stomach pain; increased hunger or thirst; increased urination  -signs and symptoms of kidney injury like trouble passing urine or change in the amount of urine  -signs and symptoms of liver injury like dark urine, light-colored  stools, loss of appetite, nausea, right upper belly pain, yellowing of the eyes or skin  -stomach pain  -sweating  -weight loss  Side effects that usually do not require medical attention (report to your doctor or health care professional if they continue or are bothersome):  -decreased appetite  -diarrhea  -tiredness  This list may not describe all possible side effects. Call your doctor for medical advice about side effects. You may report side effects to FDA at 1-800-FDA-1088.  Where should I keep my medicine?  This drug is given in a hospital or clinic and will not be stored at home.  NOTE: This sheet is a summary. It may not cover all possible information. If you have questions about this medicine, talk to your doctor, pharmacist, or health care provider.   2018 Elsevier/Gold Standard (2016-09-02 12:29:36)

## 2018-07-28 NOTE — ED Notes (Signed)
Spoke with patient's family. They are upset that they are still waiting on admission. Explained the process to the family and they are still unsatisfied. AC made aware and states that she will come speak with the family.

## 2018-07-28 NOTE — H&P (Signed)
Jeremy Patrick YQM:[id number] DOB: 06-Nov-1946 DOA: 07/09/2018     PCP: Martinique, Betty G, MD   Outpatient Specialists:    Oncology   Dr. Irene Limbo Patient arrived to ER on 07/26/2018 at 1514  Patient coming from:   home Lives With family   Chief Complaint:  Chief Complaint  Patient presents with  . Leg Pain    HPI: Jeremy Patrick is a 72 y.o. male with medical history significant of metastatic bladder cancer, HTN,  Right IJ DVT on Lovenox    Presented with   3 day hx of Right leg pain and swelling recent US showed Right leg DVT. States compliant with Lovenox our original plan was to transition to NOAC after about a month Patient recently has been switched from chemotherapy to immunotherapy and given dose of Pembrolizumab today She have had recurrent anemia requiring blood transfer last blood transfusion was on 14 August Regarding pertinent Chronic problems: History of right IJ DVT diagnosed 06/23/18 in the setting of port Placement currently on Lovenox 80 mg every 12 hours history of hypertension on Cardizem   While in ER:  The following Work up has been ordered so far:  Orders Placed This Encounter  Procedures  . DG Chest 2 View  . CBC with Differential/Platelet  . Comprehensive metabolic panel  . Troponin I  . Protime-INR  . APTT  . Consult to hospitalist  . ED EKG      Following Medications were ordered in ER: Medications  sodium chloride 0.9 % bolus 500 mL (500 mLs Intravenous New Bag/Given 07/27/2018 1715)    Significant initial  Findings: Abnormal Labs Reviewed  CBC WITH DIFFERENTIAL/PLATELET - Abnormal; Notable for the following components:      Result Value   WBC 21.1 (*)    Hemoglobin 12.2 (*)    RDW 19.3 (*)    Platelets 656 (*)    Neutro Abs 16.5 (*)    Monocytes Absolute 2.0 (*)    All other components within normal limits  COMPREHENSIVE METABOLIC PANEL - Abnormal; Notable for the following components:   Glucose, Bld 132 (*)    BUN 27 (*)    Creatinine, Ser 1.54 (*)    Albumin 2.9 (*)    AST 69 (*)    GFR calc non Af Amer 44 (*)    GFR calc Af Amer 51 (*)    All other components within normal limits  TROPONIN I - Abnormal; Notable for the following components:   Troponin I 0.03 (*)    All other components within normal limits     Na 144 K 4.5  Cr  * stable,  Up from baseline see below Lab Results  Component Value Date   CREATININE 1.54 (H) 07/09/2018   CREATININE 1.39 (H) 07/23/2018   CREATININE 1.48 (H) 07/21/2018      WBC  21.1  HG/HCT  stable,      Component Value Date/Time   HGB 12.2 (L) 08/07/2018 1708   HCT 39.4 07/27/2018 1708   plt 656   Elevated troponin 0.03  Troponin (Point of Care Test) No results for input(s): TROPIPOC in the last 72 hours.     BNP (last 3 results) No results for input(s): BNP in the last 8760 hours.  ProBNP (last 3 results) No results for input(s): PROBNP in the last 8760 hours.  Lactic Acid, Venous    Component Value Date/Time   LATICACIDVEN 1.79 06/23/2018 2137      UA  not  ordered   CXR -  NON acute, mets    ECG:  Personally reviewed by me showing: HR : 93 Rhythm: sinus tachycardia   no evidence of ischemic changes QTC 443   ED Triage Vitals  Enc Vitals Group     BP 07/22/2018 1523 130/84     Pulse Rate 08/01/2018 1523 99     Resp 07/27/2018 1523 18     Temp 07/29/2018 1523 97.6 F (36.4 C)     Temp Source 07/26/2018 1523 Oral     SpO2 07/26/2018 1523 95 %     Weight 07/27/2018 1707 149 lb 4 oz (67.7 kg)     Height 07/14/2018 1707 6' (1.829 m)     Head Circumference --      Peak Flow --      Pain Score 07/26/2018 1523 0     Pain Loc --      Pain Edu? --      Excl. in [place]? --   TMAX(24)@       Latest  Blood pressure 137/72, pulse 96, temperature 97.6 F (36.4 C), temperature source Oral, resp. rate 16, height 6' (1.829 m), weight 67.7 kg, SpO2 96 %.      ER Provider Called:     Dr. Jana Hakim They Recommend initiation of heparin Will see in AM     Hospitalist was called for admission for DVT while anticoagulated with lovenox   Review of Systems:    Pertinent positives include: fatigue, weight loss Bilateral lower extremity swelling   Constitutional:  No weight loss, night sweats, Fevers, chills,  HEENT:  No headaches, Difficulty swallowing,Tooth/dental problems,Sore throat,  No sneezing, itching, ear ache, nasal congestion, post nasal drip,  Cardio-vascular:  No chest pain, Orthopnea, PND, anasarca, dizziness, palpitations GI:  No heartburn, indigestion, abdominal pain, nausea, vomiting, diarrhea, change in bowel habits, loss of appetite, melena, blood in stool, hematemesis Resp:  no shortness of breath at rest. No dyspnea on exertion, No excess mucus, no productive cough, No non-productive cough, No coughing up of blood.No change in color of mucus.No wheezing. Skin:  no rash or lesions. No jaundice GU:  no dysuria, change in color of urine, no urgency or frequency. No straining to urinate.  No flank pain.  Musculoskeletal:  No joint pain or no joint swelling. No decreased range of motion. No back pain.  Psych:  No change in mood or affect. No depression or anxiety. No memory loss.  Neuro: no localizing neurological complaints, no tingling, no weakness, no double vision, no gait abnormality, no slurred speech, no confusion  All systems reviewed and apart from [place] all are negative  Past Medical History:   Past Medical History:  Diagnosis Date  . Cancer [facility])    bladder cancer  . GERD (gastroesophageal reflux disease)   . Hypertension       Past Surgical History:  Procedure Laterality Date  . BLADDER SURGERY    . CYSTOSCOPY N/A 04/06/2017   Procedure: CYSTOSCOPY;  Surgeon: Raynelle Bring, MD;  Location: [facility];  Service: Urology;  Laterality: N/A;  . CYSTOSCOPY W/ RETROGRADES N/A 01/28/2018   Procedure: CYSTOSCOPY WITH RETROGRADE PYELOGRAM;  Surgeon: Raynelle Bring, MD;  Location: [facility];  Service: Urology;   Laterality: N/A;  . CYSTOSCOPY W/ URETERAL STENT PLACEMENT N/A 10/08/2017   Procedure: CYSTOSCOPY WITH BILATERAL  RETROGRADE PYELOGRAM/ LEFT URETERAL STENT PLACEMENT;  Surgeon: Raynelle Bring, MD;  Location: [facility];  Service: Urology;  Laterality: N/A;  . IR CV LINE INJECTION  06/23/2018  . IR IMAGING GUIDED PORT INSERTION  05/21/2018  . IR REMOVAL TUN ACCESS W/ PORT W/O FL MOD SED  06/24/2018  . TRANSURETHRAL RESECTION OF BLADDER TUMOR N/A 04/06/2017   Procedure: TRANSURETHRAL RESECTION OF BLADDER TUMOR (TURBT);  Surgeon: Raynelle Bring, MD;  Location: [facility];  Service: Urology;  Laterality: N/A;  . TRANSURETHRAL RESECTION OF BLADDER TUMOR N/A 10/08/2017   Procedure: TRANSURETHRAL RESECTION OF BLADDER TUMOR (TURBT);  Surgeon: Raynelle Bring, MD;  Location: [facility];  Service: Urology;  Laterality: N/A;  . TRANSURETHRAL RESECTION OF BLADDER TUMOR N/A 01/28/2018   Procedure: TRANSURETHRAL RESECTION OF BLADDER TUMOR (TURBT);  Surgeon: Raynelle Bring, MD;  Location: [facility];  Service: Urology;  Laterality: N/A;  GENERAL ANESTHESIA WITH PARALYSIS  . TRANSURETHRAL RESECTION OF BLADDER TUMOR WITH MITOMYCIN-C N/A 03/02/2017   Procedure: TRANSURETHRAL RESECTION OF BLADDER TUMOR/ EXAM UNDER ANESTHESIA/ WITH MITOMYCIN-C POST OPERATIVE;  Surgeon: Raynelle Bring, MD;  Location: [facility];  Service: Urology;  Laterality: N/A;    Social History:  Ambulatory , walker or   wheelchair bound,      reports that he quit smoking about 39 years ago. His smoking use included cigarettes. He has a 1.50 pack-year smoking history. He has never used smokeless tobacco. He reports that he drank alcohol. He reports that he does not use drugs.     Family History:   Family History  Problem Relation Age of Onset  . Hypertension Mother   . Stroke Mother   . Diabetes Mother   . Cataracts Mother   . Kidney disease Mother   . Hypertension Father   . Stroke Father   . Aneurysm Maternal Grandmother     Allergies: No Known  Allergies   Prior to Admission medications   Medication Sig Start Date End Date Taking? Authorizing Provider  acetaminophen (TYLENOL) 500 MG tablet Take 1,000 mg by mouth daily as needed for moderate pain.    Yes [provider]  benzonatate (TESSALON) 100 MG capsule Take 1 capsule (100 mg total) by mouth 3 (three) times daily as needed for cough. 05/19/18  Yes Brunetta Genera, MD  Cyanocobalamin (VITAMIN B 12 PO) Take 1 tablet by mouth daily.   Yes [provider]  diltiazem (CARDIZEM CD) 360 MG 24 hr capsule TAKE 1 CAPSULE BY MOUTH  DAILY. 06/18/18  Yes Martinique, Betty G, MD  enoxaparin (LOVENOX) 80 MG/0.8ML injection Inject 0.8 mLs (80 mg total) into the skin every 12 (twelve) hours. Start taking 06/24/2018 at 6pm 06/23/18  Yes Brunetta Genera, MD  Ferrous Gluconate (IRON 27 PO) Take 27 mg by mouth daily.   Yes [provider]  Glucos-Chondroit-Hyaluron-MSM (GLUCOSAMINE CHONDROITIN JOINT PO) Take 1,500 mg by mouth daily.   Yes [provider]  HYDROcodone-acetaminophen (NORCO/VICODIN) 5-325 MG tablet Take 1 tablet by mouth every 6 (six) hours as needed for moderate pain or severe pain. 06/30/18  Yes Brunetta Genera, MD  mirtazapine (REMERON) 15 MG tablet Take 1 tablet (15 mg total) by mouth at bedtime. 06/30/18  Yes Brunetta Genera, MD  ondansetron (ZOFRAN-ODT) 4 MG disintegrating tablet Take 1 tablet (4 mg total) by mouth every 8 (eight) hours as needed for nausea or vomiting. 04/28/18  Yes Brunetta Genera, MD  senna-docusate (SENNA S) 8.6-50 MG tablet Take 2 tablets by mouth at bedtime. 06/09/18  Yes Brunetta Genera, MD  cefpodoxime (VANTIN) 100 MG tablet Take 1 tablet (100 mg total) by mouth 2 (two) times daily. Patient not taking:  Reported on 06/23/2018 06/09/18   Brunetta Genera, MD  dronabinol (MARINOL) 5 MG capsule Take 1 capsule (5 mg total) by mouth 2 (two) times daily before a meal. Patient not taking: Reported on 08/01/2018  05/19/18   Brunetta Genera, MD  guaiFENesin-dextromethorphan [facility] DM) 100-10 MG/5ML syrup Take 5 mLs by mouth 3 (three) times daily as needed for cough. Patient not taking: Reported on 07/16/2018 04/26/18   Davonna Belling, MD  phenazopyridine (PYRIDIUM) 200 MG tablet Take 1 tablet (200 mg total) by mouth 3 (three) times daily. Patient not taking: Reported on 07/11/2018 05/29/18   Tanna Furry, MD  ranitidine (ZANTAC) 300 MG tablet TAKE 1 TABLET BY MOUTH  EVERY MORNING. Patient not taking: Reported on 08/01/2018 06/18/18   Martinique, Betty G, MD  sildenafil (VIAGRA) 50 MG tablet Take 1 tablet (50 mg total) by mouth daily as needed for erectile dysfunction. Patient not taking: Reported on 07/31/2018 04/20/18   Martinique, Betty G, MD  prochlorperazine (COMPAZINE) 10 MG tablet Take 1 tablet (10 mg total) by mouth every 6 (six) hours as needed (Nausea or vomiting). 05/18/18 07/21/18  Brunetta Genera, MD   Physical Exam: Blood pressure 137/72, pulse 96, temperature 97.6 F (36.4 C), temperature source Oral, resp. rate 16, height 6' (1.829 m), weight 67.7 kg, SpO2 96 %. 1. General:  in No Acute distress   Chronically ill  -appearing 2. Psychological: Alert and   Oriented 3. Head/ENT:    Dry Mucous Membranes                          Head Non traumatic, neck supple                           Poor Dentition 4. SKIN:   decreased Skin turgor,  Skin clean Dry and intact no rash 5. Heart: Regular rate and rhythm  systolic Murmur, no Rub or gallop 6. Lungs:  no wheezes or crackles   7. Abdomen: Soft, non-tender, Non distended   bowel sounds present 8. Lower extremities: no clubbing, cyanosis, trace edema 9. Neurologically Grossly intact, moving all 4 extremities equally   10. MSK: Normal range of motion   LABS:     Recent Labs  Lab 07/19/2018 0847 07/14/2018 1708  WBC 27.6* 21.1*  NEUTROABS 20.6* 16.5*  HGB 9.3* 12.2*  HCT 29.8* 39.4  MCV 83.6 86.0  PLT 653* 132*   Basic Metabolic  Panel: Recent Labs  Lab 07/22/2018 0847 07/13/2018 1708  NA 140 144  K 4.5 4.5  CL 102 101  CO2 24 29  GLUCOSE 179* 132*  BUN 21 27*  CREATININE 1.39* 1.54*  CALCIUM 9.1 9.3  MG 2.0  --       Recent Labs  Lab 07/29/2018 0847 07/25/2018 1708  AST 61* 69*  ALT 27 33  ALKPHOS 125 120  BILITOT 0.6 0.6  PROT 7.2 7.9  ALBUMIN 2.3* 2.9*   No results for input(s): LIPASE, AMYLASE in the last 168 hours. No results for input(s): AMMONIA in the last 168 hours.    HbA1C: No results for input(s): HGBA1C in the last 72 hours. CBG: No results for input(s): GLUCAP in the last 168 hours.    Urine analysis:    Component Value Date/Time   COLORURINE YELLOW 06/30/2018 1600   APPEARANCEUR CLEAR 06/30/2018 1600   LABSPEC 1.020 06/30/2018 1600   PHURINE 5.0 06/30/2018 1600   GLUCOSEU  NEGATIVE 06/30/2018 1600   HGBUR NEGATIVE 06/30/2018 1600   BILIRUBINUR NEGATIVE 06/30/2018 1600   KETONESUR NEGATIVE 06/30/2018 1600   PROTEINUR NEGATIVE 06/30/2018 1600   NITRITE NEGATIVE 06/30/2018 1600   LEUKOCYTESUR NEGATIVE 06/30/2018 1600       Cultures:    Component Value Date/Time   SDES  05/29/2018 1506    URINE, RANDOM Performed at [facility], [place] [address], [place], [place] [address]    SPECREQUEST  05/29/2018 1506    NONE Performed at [facility], [place] [address], [place], [place] [address]    CULT (A) 05/29/2018 1506    <10,000 COLONIES/mL INSIGNIFICANT GROWTH Performed at [place] [address], [place], [place] [address]    REPTSTATUS 05/31/2018 FINAL 05/29/2018 1506     Radiological Exams on Admission: Dg Chest 2 View  Result Date: 07/16/2018 CLINICAL DATA:  Metastatic bladder CA EXAM: CHEST - 2 VIEW COMPARISON:  07/21/2018 FINDINGS: Cardiac shadow is stable. Multiple too numerous to number pulmonary metastatic lesions are identified. Associated right-sided pleural effusion and right lower lobe consolidation is seen  similar to that noted on prior exam IMPRESSION: Changes consistent with known pulmonary metastatic disease. The overall appearance is similar to that seen on recent PET-CT. Electronically Signed   By: Inez Catalina M.D.   On: 07/15/2018 17:47    Chart has been reviewed    Assessment/Plan  72 y.o. male with medical history significant of metastatic bladder cancer, HTN,  Right IJ DVT on Lovenox  Admitted for bilateral DVT despite being on full dose anticoagulation elevated troponin cannot rule out PE at this time Present on Admission: . DVT (deep venous thrombosis) ([place]) spite being on Lovenox and compliant will initiate heparin drip as per oncology, will rehydrate if creatinine improves may benefit from CTA to evaluate for PE versus V/Q scan versus holding off on further studies as patient is going to be likely anticoagulated indefinitely defer to oncology . Elevated troponin -transient, repeat troponin unremarkable.  Obtain echo as a possibility of PE to evaluate for right-sided strain monitor on telemetry continue to cycle cardiac enzymes . Essential hypertension hold blood pressure medications to allow permissive hypertension . Gastroesophageal reflux disease without esophagitis stable continue home medications . Malignant neoplasm of urinary bladder [facility]) notify oncology the patient has been admitted overall poor prognosis given extensive disease with metastatic spread and poor performance status    Other plan as per orders.  DVT prophylaxis:  Heparin    Code Status:  FULL CODE  as per patient   I had personally discussed CODE STATUS with patient and family  Family Communication:   Family   at  Bedside  plan of care was discussed with   Son, Daughter, Wife,    Disposition Plan:       To home once workup is complete and patient is stable                                                     Nutrition    consulted                                     Consults called: Oncology     Admission status:  inpatient  Level of care     tele               Toy Baker 07/17/2018, 9:13 PM    [facility]  Pager [phone]   after 2 AM please page floor coverage PA If 7AM-7PM, please contact the day team taking care of the patient  [email or web address]  Password [email or web address]

## 2018-07-29 ENCOUNTER — Inpatient Hospital Stay (HOSPITAL_COMMUNITY): Payer: Medicare Other

## 2018-07-29 ENCOUNTER — Ambulatory Visit (HOSPITAL_COMMUNITY): Payer: Medicare Other

## 2018-07-29 ENCOUNTER — Ambulatory Visit: Payer: Medicare Other

## 2018-07-29 ENCOUNTER — Encounter (HOSPITAL_COMMUNITY): Payer: Medicare Other

## 2018-07-29 DIAGNOSIS — E43 Unspecified severe protein-calorie malnutrition: Secondary | ICD-10-CM

## 2018-07-29 LAB — TROPONIN I
Troponin I: 0.03 ng/mL (ref <0.03)
Troponin I: <0.03 ng/mL (ref <0.03)

## 2018-07-29 LAB — COMPREHENSIVE METABOLIC PANEL
ALT: 23 U/L (ref 0–44)
AST: 43 U/L — ABNORMAL HIGH (ref 15–41)
Albumin: 2.3 g/dL — ABNORMAL LOW (ref 3.5–5.0)
Alkaline Phosphatase: 92 U/L (ref 38–126)
Anion gap: 9 (ref 5–15)
BUN: 24 mg/dL — ABNORMAL HIGH (ref 8–23)
CO2: 28 mmol/L (ref 22–32)
Calcium: 8.5 mg/dL — ABNORMAL LOW (ref 8.9–10.3)
Chloride: 105 mmol/L (ref 98–111)
Creatinine, Ser: 1.28 mg/dL — ABNORMAL HIGH (ref 0.61–1.24)
GFR calc Af Amer: >60 mL/min (ref >60)
GFR calc non Af Amer: 55 mL/min — ABNORMAL LOW (ref >60)
Glucose, Bld: 109 mg/dL — ABNORMAL HIGH (ref 70–99)
Potassium: 4.2 mmol/L (ref 3.5–5.1)
Sodium: 142 mmol/L (ref 135–145)
Total Bilirubin: 0.5 mg/dL (ref 0.3–1.2)
Total Protein: 6.3 g/dL — ABNORMAL LOW (ref 6.5–8.1)

## 2018-07-29 LAB — CBC
HCT: 26 % — ABNORMAL LOW (ref 39.0–52.0)
Hemoglobin: 8 g/dL — ABNORMAL LOW (ref 13.0–17.0)
MCH: 26.4 pg (ref 26.0–34.0)
MCHC: 30.8 g/dL (ref 30.0–36.0)
MCV: 85.8 fL (ref 78.0–100.0)
Platelets: 765 10*3/uL — ABNORMAL HIGH (ref 150–400)
RBC: 3.03 MIL/uL — ABNORMAL LOW (ref 4.22–5.81)
RDW: 19.3 % — ABNORMAL HIGH (ref 11.5–15.5)
WBC: 22 10*3/uL — ABNORMAL HIGH (ref 4.0–10.5)

## 2018-07-29 LAB — ECHOCARDIOGRAM COMPLETE
Height: 72 in
Weight: 2388.02 oz

## 2018-07-29 LAB — TSH: TSH: 2.321 u[IU]/mL (ref 0.350–4.500)

## 2018-07-29 LAB — HEPARIN LEVEL (UNFRACTIONATED)
Heparin Unfractionated: <0.1 IU/mL — ABNORMAL LOW (ref 0.30–0.70)
Heparin Unfractionated: 0.16 IU/mL — ABNORMAL LOW (ref 0.30–0.70)

## 2018-07-29 LAB — MAGNESIUM: Magnesium: 2.2 mg/dL (ref 1.7–2.4)

## 2018-07-29 LAB — PREALBUMIN: Prealbumin: <5 mg/dL — ABNORMAL LOW (ref 18–38)

## 2018-07-29 LAB — PHOSPHORUS: Phosphorus: 2.6 mg/dL (ref 2.5–4.6)

## 2018-07-29 MED ORDER — BOOST PLUS PO LIQD
237.0000 mL | Freq: Three times a day (TID) | ORAL | Status: DC
  Administered 2018-07-29 – 2018-08-03 (×6): 237 mL via ORAL
  Filled 2018-07-29 (×26): qty 237

## 2018-07-29 MED ORDER — HEPARIN BOLUS VIA INFUSION
2500.0000 [IU] | Freq: Once | INTRAVENOUS | Status: AC
  Administered 2018-07-29: 2500 [IU] via INTRAVENOUS
  Filled 2018-07-29: qty 2500

## 2018-07-29 MED ORDER — HEPARIN (PORCINE) IN NACL 100-0.45 UNIT/ML-% IJ SOLN
1300.0000 [IU]/h | INTRAMUSCULAR | Status: DC
  Administered 2018-07-29: 1300 [IU]/h via INTRAVENOUS

## 2018-07-29 MED ORDER — HEPARIN (PORCINE) IN NACL 100-0.45 UNIT/ML-% IJ SOLN
1500.0000 [IU]/h | INTRAMUSCULAR | Status: DC
  Administered 2018-07-29: 1500 [IU]/h via INTRAVENOUS
  Filled 2018-07-29: qty 250

## 2018-07-29 MED ORDER — ADULT MULTIVITAMIN W/MINERALS CH
1.0000 | ORAL_TABLET | Freq: Every day | ORAL | Status: DC
  Administered 2018-07-29 – 2018-08-05 (×7): 1 via ORAL
  Filled 2018-07-29 (×8): qty 1

## 2018-07-29 MED ORDER — HEPARIN BOLUS VIA INFUSION
2000.0000 [IU] | Freq: Once | INTRAVENOUS | Status: AC
  Administered 2018-07-29: 2000 [IU] via INTRAVENOUS
  Filled 2018-07-29: qty 2000

## 2018-07-29 NOTE — Progress Notes (Signed)
ANTICOAGULATION CONSULT NOTE - Consult  Pharmacy Consult for heparin drip Indication: VTE treatment  No Known Allergies  Patient Measurements: Height: 6' (182.9 cm) Weight: 149 lb 4 oz (67.7 kg) IBW/kg (Calculated) : 77.6 Heparin Dosing Weight: TBW  Vital Signs: Temp: 99.7 F (37.6 C) (08/22 1300) Temp Source: Oral (08/22 1300) BP: 144/85 (08/22 1259) Pulse Rate: 93 (08/22 1259)  Labs: Recent Labs    07/27/2018 0847  07/23/2018 1708 07/22/2018 2232 07/29/18 0510 07/29/18 1100 07/29/18 1439  HGB 9.3*  --  12.2*  --  8.0*  --   --   HCT 29.8*  --  39.4  --  26.0*  --   --   PLT 653*  --  656*  --  765*  --   --   APTT  --   --  35  --   --   --   --   LABPROT  --   --  14.5  --   --   --   --   INR  --   --  1.14  --   --   --   --   HEPARINUNFRC  --   --   --   --  <0.10*  --  0.16*  CREATININE 1.39*  --  1.54*  --  1.28*  --   --   TROPONINI  --    < > 0.03* <0.03 0.03* <0.03  --    < > = values in this interval not displayed.    Estimated Creatinine Clearance: 51.4 mL/min (A) (by C-G formula based on SCr of 1.28 mg/dL (H)).   Medical History: Past Medical History:  Diagnosis Date  . Cancer [facility])    bladder cancer  . GERD (gastroesophageal reflux disease)   . Hypertension    Assessment: Pharmacy consulted to dose and monitor heparin infusion in this 72 year old male for VTE treatment. Patient has PMH significant for bladder cancer with lung metastases and right IJ VTE (July 2019). Patient was on enoxaparin 80 mg subcutaneously q12h PTA (last dose 07/20/2018 @ 0600) and developed right leg DVT despite being on therapeutic LMWH. Patient was converted to a heparin drip on admission in preparation for thoracentesis.   Baseline labs (07/22/2018):  -Hgb 12.2 (however, Hgb was 9.1 on 07/21/18) -Plt 656 -INR 1.1 -APTT 35 seconds  Today, 07/29/18  Hgb 8 (decreased from yesterday) - discussed with RN and MD - no signs of bleeding. Continue to monitor closely  Plt 765  HL  0.16 has increased but remains subtherapeutic on current rate of heparin 1300 units/hr. Confirmed with RN there have been no interruptions/issues with infusion (MAR has a pause charted @ 0831 but may be charting error as drip has not been interrupted today).   Goal of Therapy:  Heparin level 0.3-0.7 units/ml Monitor platelets by anticoagulation protocol: Yes   Plan:   Give 2000 unit bolus  Increase heparin drip to 1500 units/hr  Check anti-Xa level in 8 hours and daily while on heparin  Continue to monitor H&H and platelets  Monitor closely for any signs or symptoms of bleeding  Lenis Noon, PharmD, BCPS Clinical Pharmacist 07/29/2018,3:51 PM

## 2018-07-29 NOTE — Progress Notes (Signed)
  Echocardiogram 2D Echocardiogram has been performed.  Madelaine Etienne 07/29/2018, 11:54 AM

## 2018-07-29 NOTE — Progress Notes (Signed)
Initial Nutrition Assessment  DOCUMENTATION CODES:   Severe malnutrition in context of chronic illness  INTERVENTION:    Boost Plus TID chocolate- Each supplement provides 360kcal and 14g protein.    Magic cup TID with meals, each supplement provides 290 kcal and 9 grams of protein  Provide MVI daily  NUTRITION DIAGNOSIS:   Severe Malnutrition related to chronic illness, cancer and cancer related treatments as evidenced by energy intake < or equal to 75% for > or equal to 1 month, percent weight loss, moderate fat depletion, severe fat depletion, moderate muscle depletion, severe muscle depletion.  GOAL:   Patient will meet greater than or equal to 90% of their needs  MONITOR:   PO intake, Supplement acceptance, Weight trends, Labs  REASON FOR ASSESSMENT:   Consult Malnutrition Eval  ASSESSMENT:   Patient with PMH significant for metastatic bladder cancer, HTN, and right IJ DVT. Admitted for bilateral DVT despite being on full dose anticoagulation elevated troponin cannot rule out PE at this time.    Pt is followed by [place] RD. He reports intake remains poor and has noticed an even further decrease this week PTA. States he tries to eat one "meat meal" per day but can only tolerate a couple of bites. Pt endorses taste changes but is unable to describe them. [place] discussed with pt to increase Boost to TID, but pt has a hard time doing so. Over the past week pt was able to tolerate one Boost. He asked for freshly squeezed orange juice multiple times during RD visit and seemed a little confused. Will continue Boost this admission and monitor intake closely.   Pt unsure of his UBW, [place] records it as 215 lb. Records indicate pt weighed 204 lb 04/06/18 and 150 lb this admission (26.5% wt loss in 4 months, significant for time frame). Nutrition-Focused physical exam completed.   Medications reviewed and include: Remeron Labs reviewed.   NUTRITION -  FOCUSED PHYSICAL EXAM:    Most Recent Value  Orbital Region  Moderate depletion  Upper Arm Region  Severe depletion  Thoracic and Lumbar Region  Severe depletion  Buccal Region  Moderate depletion  Temple Region  Severe depletion  Clavicle Bone Region  Severe depletion  Clavicle and Acromion Bone Region  Severe depletion  Scapular Bone Region  Moderate depletion  Dorsal Hand  Moderate depletion  Patellar Region  Severe depletion  Anterior Thigh Region  Severe depletion  Posterior Calf Region  Severe depletion  Edema (RD Assessment)  None  Hair  Reviewed  Eyes  Reviewed  Mouth  Reviewed  Skin  Reviewed  Nails  Reviewed     Diet Order:   Diet Order            Diet Heart Room service appropriate? Yes; Fluid consistency: Thin  Diet effective now              EDUCATION NEEDS:   Education needs have been addressed  Skin:  Skin Assessment: Reviewed RN Assessment  Last BM:  07/26/18  Height:   Ht Readings from Last 1 Encounters:  07/24/2018 6' (1.829 m)    Weight:   Wt Readings from Last 1 Encounters:  07/15/2018 67.7 kg    Ideal Body Weight:  80.9 kg  BMI:  Body mass index is 20.24 kg/m.  Estimated Nutritional Needs:   Kcal:  5993-5701 kcal  Protein:  115-130 grams   Fluid:  >/= 2.3 L/day  Mariana Single RD, LDN Clinical  Nutrition Pager # - [phone]

## 2018-07-29 NOTE — Progress Notes (Signed)
ANTICOAGULATION CONSULT NOTE - Consult  Pharmacy Consult for heparin drip Indication: VTE treatment  No Known Allergies  Patient Measurements: Height: 6' (182.9 cm) Weight: 149 lb 4 oz (67.7 kg) IBW/kg (Calculated) : 77.6 Heparin Dosing Weight: TBW  Vital Signs: Temp: 99 F (37.2 C) (08/22 0555) Temp Source: Oral (08/22 0555) BP: 140/78 (08/22 0555) Pulse Rate: 92 (08/22 0555)  Labs: Recent Labs    08/04/2018 0847 07/16/2018 1708 07/11/2018 2232 07/29/18 0510  HGB 9.3* 12.2*  --  8.0*  HCT 29.8* 39.4  --  26.0*  PLT 653* 656*  --  765*  APTT  --  35  --   --   LABPROT  --  14.5  --   --   INR  --  1.14  --   --   HEPARINUNFRC  --   --   --  <0.10*  CREATININE 1.39* 1.54*  --  1.28*  TROPONINI  --  0.03* <0.03 0.03*    Estimated Creatinine Clearance: 51.4 mL/min (A) (by C-G formula based on SCr of 1.28 mg/dL (H)).   Medical History: Past Medical History:  Diagnosis Date  . Cancer [facility])    bladder cancer  . GERD (gastroesophageal reflux disease)   . Hypertension    Assessment: Pharmacy consulted to dose and monitor heparin infusion in this 72 year old male for VTE treatment. 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 Patient was on enoxaparin 80 mg subcutaneously q12h PTA and is being converted to a heparin drip in preparation for thoracentesis.   Baseline labs ordered and resulted.  8/21  Hgb 12.2  Plt 656  Last enoxaparin dose was @ 0600 on 07/20/2018 Today, 8/22  0510 HL= <0.10 below goal, no infusion or bleeding issues per RN.   Goal of Therapy:  Heparin level 0.3-0.7 units/ml Monitor platelets by anticoagulation protocol: Yes   Plan:   Give 2500 units rebolus x1 now  Increase heparin drip to 1300 units/hr  Check anti-Xa level in 8 hours and daily while on heparin  Continue to monitor H&H and platelets  Monitor for any signs or symptoms of bleeding   [name] 07/29/2018,6:54 AM

## 2018-07-29 NOTE — Progress Notes (Signed)
CRITICAL VALUE ALERT  Critical Value:  Troponin 0.03  Date & Time Notied:  0637  Provider Notified: Schorr  Orders Received/Actions taken: Awaiting

## 2018-07-29 NOTE — Progress Notes (Signed)
Patient Demographics:    Jeremy Patrick, is a 72 y.o. male, DOB - February 22, 1946, [id number]  Admit date - 08/03/2018   Admitting Physician Toy Baker, MD  Outpatient Primary MD for the patient is [place], Malka So, MD  LOS - 1   Chief Complaint  Patient presents with  . Leg Pain        Subjective:    Jeremy Patrick today has no fevers, no emesis,  No chest pain,  Wife at bedside   Assessment  & Plan :    Principal Problem:   DVT (deep venous thrombosis) (HCC) Active Problems:   Essential hypertension   Gastroesophageal reflux disease without esophagitis   Malignant neoplasm of urinary bladder (HCC)   Elevated troponin   DVT (deep vein thrombosis) in pregnancy ([place])   Protein-calorie malnutrition, severe  Brief summary:- 72 y.o. male with medical history significant of metastatic bladder cancer, HTN,  Right IJ DVT (Dxed 06/23/18) on Lovenox, sadly has developed bilateral lower extremity DVT despite being compliant with Lovenox admitted on 07/27/2018    Plan:-  1)Hypercoagulable state-recurrent DVTs despite compliance with Lovenox, discussed with his oncologist Dr. Irene Limbo, continue IV heparin for now to allow for thoracentensis, possible discharge on Arixtra, echo with EF of 65 to 70% with mild pulmonary hypertension, cannot rule out PE  2)Acute on chronic Anemia--- no evidence of acute bleeding, check stool occult blood, hemoglobin yesterday was 12.2 today down to 8.0 but recently patient baseline hemoglobin is usually around 9. Hgb of 12.2 may have been erroneous (patient may have been hemoconcentrated/dehydrated), drop in hgb partly related to hemodilution . recurrent anemia requiring blood transfer last blood transfusion was on 07/21/18  3)Metastatic bladder cancer-- Patient recently has been switched from chemotherapy to immunotherapy and given dose of Pembrolizumab on  08/02/2018  4)Social/Ethics-- Prognosis is poor, patient is a full code  5)Severe  Protein caloric malnutrition--- give Remeron for appetite stimulation, give nutritional supplements  6) right-sided pleural effusion--- suspect malignancy related, plan is for therapeutic thoracentensis on 07/30/2018  Code Status : Full    Disposition Plan  : Home   Consults  :  D/w Dr Irene Limbo   DVT Prophylaxis  :  Iv Heparin  Lab Results  Component Value Date   PLT 765 (H) 07/29/2018    Inpatient Medications  Scheduled Meds: . lactose free nutrition  237 mL Oral TID WC  . mirtazapine  15 mg Oral QHS  . multivitamin with minerals  1 tablet Oral Daily  . senna-docusate  2 tablet Oral QHS   Continuous Infusions: . sodium chloride 100 mL/hr at 07/29/18 1500  . heparin 1,500 Units/hr (07/29/18 1559)   PRN Meds:.sodium chloride, acetaminophen **OR** acetaminophen, HYDROcodone-acetaminophen, ondansetron **OR** ondansetron (ZOFRAN) IV    Anti-infectives (From admission, onward)   None        Objective:   Vitals:   07/29/18 0555 07/29/18 0951 07/29/18 1259 07/29/18 1300  BP: 140/78 131/83 (!) 144/85   Pulse: 92 99 93   Resp: 20 18 18    Temp: 99 F (37.2 C) 99.3 F (37.4 C)  99.7 F (37.6 C)  TempSrc: Oral Oral  Oral  SpO2: 92% 92% 94%   Weight:      Height:  Wt Readings from Last 3 Encounters:  07/27/2018 67.7 kg  07/21/18 67.7 kg  07/07/18 71.9 kg     Intake/Output Summary (Last 24 hours) at 07/29/2018 1711 Last data filed at 07/29/2018 1600 Gross per 24 hour  Intake 1939.6 ml  Output -  Net 1939.6 ml     Physical Exam  Gen:- Awake Alert, thin and  chronically ill-appearing HEENT:- .AT, No sclera icterus Neck-Supple Neck,No JVD,.  Lungs-diminished on the right, no wheezing  CV- S1, S2 normal, regular Abd-  +ve B.Sounds, Abd Soft, No tenderness,    Extremity/Skin:-Co. lower extremity pain, pulses are positive  psych-affect is appropriate, oriented x3 Neuro-no  new focal deficits, no tremors   Data Review:   Micro Results No results found for this or any previous visit (from the past 240 hour(s)).  Radiology Reports Dg Chest 2 View  Result Date: 07/29/2018 CLINICAL DATA:  Metastatic bladder CA EXAM: CHEST - 2 VIEW COMPARISON:  07/21/2018 FINDINGS: Cardiac shadow is stable. Multiple too numerous to number pulmonary metastatic lesions are identified. Associated right-sided pleural effusion and right lower lobe consolidation is seen similar to that noted on prior exam IMPRESSION: Changes consistent with known pulmonary metastatic disease. The overall appearance is similar to that seen on recent PET-CT. Electronically Signed   By: Inez Catalina M.D.   On: 07/12/2018 17:47   Nm Pet Image Restag (ps) Skull Base To Thigh  Result Date: 07/21/2018 CLINICAL DATA:  Small cell lung cancer treatment strategy for metastatic bladder cancer. EXAM: NUCLEAR MEDICINE PET SKULL BASE TO THIGH TECHNIQUE: 7.7 mCi F-18 FDG was injected intravenously. Full-ring PET imaging was performed from the skull base to thigh after the radiotracer. CT data was obtained and used for attenuation correction and anatomic localization. Fasting blood glucose: 150 mg/dl COMPARISON:  None. FINDINGS: Mediastinal blood pool activity: SUV max 2.7 NECK: No hypermetabolic lymph nodes in the neck. Incidental CT findings: none CHEST: Multiple hypermetabolic nodule pleural metastasis in the RIGHT hemithorax again demonstrated. The overall involvement of the pleural space extending into the RIGHT hilum and RIGHT subcarinal nodal station appears increased compared to prior. Example activity subcarinal lymph nodes are increased in volume with SUV max equal 7.8 compared to SUV max equal 5.5. Two round masses in the RIGHT upper lobe have decreased metabolic activity and mild increase in size. For example RIGHT upper lobe mass measuring 3.5 cm SUV max equal 13.5 compares 3.2 cm mass with SUV max equal 21. RIGHT lower  lobe mass measures 4.6 cm increased from 3.8 cm. Metabolic activity is more peripheral suggesting necrosis. RIGHT middle lobe mass measuring 4.7 cm compared to 4.4 cm. SUV max equal 13.7 compared 18.8. The pleural metastasis is slightly decreased in metabolic with activity SUV max equal 9.3 compared to SUV max equal 13. Pleural mass in the posterior RIGHT lung base measures 30 mm in thickness compared to 20 mm. Hypermetabolic nodules in the LEFT lung are similar with. For example LEFT lower lobe nodule measuring 2.1 cm compares to 2.1 cm with SUV max equal 6.9 compared to 6.3. Incidental CT findings: Choose one ABDOMEN/PELVIS: No abnormal hypermetabolic activity within the liver, pancreas, adrenal glands, or spleen. No hypermetabolic lymph nodes in the abdomen or pelvis. Incidental CT findings: none SKELETON: No focal hypermetabolic activity to suggest skeletal metastasis. Incidental CT findings: none IMPRESSION: 1. Overall evidence of progression of extensive pleural and parenchymal metastasis in the RIGHT hemithorax. 2. The large hypermetabolic RIGHT lung masses are decreased mildly in metabolic activity. However this  size of lesions are increased. This finding could be seen with immunotherapy. 3. Likewise there is evidence of increase in size of pleural metastasis with increased extension into the subcarinal nodal station although again the metabolic activity is slightly decreased. Activity of these lesions me remains intense consistent with active malignancy. 4. Hypermetabolic metastatic pulmonary nodules in LEFT lung are stable. Electronically Signed   By: Suzy Bouchard M.D.   On: 07/21/2018 11:57     CBC Recent Labs  Lab 07/31/2018 0847 08/05/2018 1708 07/29/18 0510  WBC 27.6* 21.1* 22.0*  HGB 9.3* 12.2* 8.0*  HCT 29.8* 39.4 26.0*  PLT 653* 656* 765*  MCV 83.6 86.0 85.8  MCH 26.2* 26.6 26.4  MCHC 31.3* 31.0 30.8  RDW 19.4* 19.3* 19.3*  LYMPHSABS 2.3 2.5  --   MONOABS 4.6* 2.0*  --   EOSABS  0.0 0.0  --   BASOSABS 0.1 0.1  --     Chemistries  Recent Labs  Lab 07/27/2018 0847 07/31/2018 1708 07/29/18 0510  NA 140 144 142  K 4.5 4.5 4.2  CL 102 101 105  CO2 24 29 28   GLUCOSE 179* 132* 109*  BUN 21 27* 24*  CREATININE 1.39* 1.54* 1.28*  CALCIUM 9.1 9.3 8.5*  MG 2.0  --  2.2  AST 61* 69* 43*  ALT 27 33 23  ALKPHOS 125 120 92  BILITOT 0.6 0.6 0.5   ------------------------------------------------------------------------------------------------------------------ No results for input(s): CHOL, HDL, LDLCALC, TRIG, CHOLHDL, LDLDIRECT in the last 72 hours.  No results found for: HGBA1C ------------------------------------------------------------------------------------------------------------------ Recent Labs    07/29/18 0510  TSH 2.321   ------------------------------------------------------------------------------------------------------------------ No results for input(s): VITAMINB12, FOLATE, FERRITIN, TIBC, IRON, RETICCTPCT in the last 72 hours.  Coagulation profile Recent Labs  Lab 07/10/2018 1708  INR 1.14    No results for input(s): DDIMER in the last 72 hours.  Cardiac Enzymes Recent Labs  Lab 07/21/2018 2232 07/29/18 0510 07/29/18 1100  TROPONINI <0.03 0.03* <0.03   ------------------------------------------------------------------------------------------------------------------ No results found for: BNP   Roxan Hockey M.D on 07/29/2018 at 5:11 PM   Go to [email or web address] - password [email or web address] for contact info  [facility] - Office  [phone]

## 2018-07-30 ENCOUNTER — Inpatient Hospital Stay (HOSPITAL_COMMUNITY): Payer: Medicare Other

## 2018-07-30 DIAGNOSIS — E43 Unspecified severe protein-calorie malnutrition: Secondary | ICD-10-CM

## 2018-07-30 DIAGNOSIS — Z6820 Body mass index (BMI) 20.0-20.9, adult: Secondary | ICD-10-CM

## 2018-07-30 DIAGNOSIS — D473 Essential (hemorrhagic) thrombocythemia: Secondary | ICD-10-CM

## 2018-07-30 DIAGNOSIS — D630 Anemia in neoplastic disease: Secondary | ICD-10-CM

## 2018-07-30 DIAGNOSIS — Z87891 Personal history of nicotine dependence: Secondary | ICD-10-CM

## 2018-07-30 DIAGNOSIS — I82403 Acute embolism and thrombosis of unspecified deep veins of lower extremity, bilateral: Secondary | ICD-10-CM

## 2018-07-30 DIAGNOSIS — J90 Pleural effusion, not elsewhere classified: Secondary | ICD-10-CM

## 2018-07-30 DIAGNOSIS — C679 Malignant neoplasm of bladder, unspecified: Secondary | ICD-10-CM

## 2018-07-30 LAB — BODY FLUID CELL COUNT WITH DIFFERENTIAL
Eos, Fluid: 0 %
Lymphs, Fluid: 58 %
Monocyte-Macrophage-Serous Fluid: 37 % — ABNORMAL LOW (ref 50–90)
Neutrophil Count, Fluid: 5 % (ref 0–25)
Total Nucleated Cell Count, Fluid: 293 cu mm (ref 0–1000)

## 2018-07-30 LAB — CBC
HCT: 28.8 % — ABNORMAL LOW (ref 39.0–52.0)
Hemoglobin: 8.7 g/dL — ABNORMAL LOW (ref 13.0–17.0)
MCH: 26.3 pg (ref 26.0–34.0)
MCHC: 30.2 g/dL (ref 30.0–36.0)
MCV: 87 fL (ref 78.0–100.0)
Platelets: 609 10*3/uL — ABNORMAL HIGH (ref 150–400)
RBC: 3.31 MIL/uL — ABNORMAL LOW (ref 4.22–5.81)
RDW: 19.3 % — ABNORMAL HIGH (ref 11.5–15.5)
WBC: 21.5 10*3/uL — ABNORMAL HIGH (ref 4.0–10.5)

## 2018-07-30 LAB — HEPARIN LEVEL (UNFRACTIONATED)
Heparin Unfractionated: 0.15 IU/mL — ABNORMAL LOW (ref 0.30–0.70)
Heparin Unfractionated: 0.5 IU/mL (ref 0.30–0.70)

## 2018-07-30 LAB — LACTATE DEHYDROGENASE, PLEURAL OR PERITONEAL FLUID: LD, Fluid: 296 U/L — ABNORMAL HIGH (ref 3–23)

## 2018-07-30 LAB — LACTATE DEHYDROGENASE: LDH: 248 U/L — ABNORMAL HIGH (ref 98–192)

## 2018-07-30 LAB — PROTEIN, TOTAL: Total Protein: 6.3 g/dL — ABNORMAL LOW (ref 6.5–8.1)

## 2018-07-30 LAB — HIV ANTIBODY (ROUTINE TESTING W REFLEX): HIV Screen 4th Generation wRfx: NONREACTIVE

## 2018-07-30 LAB — GLUCOSE, CAPILLARY: Glucose-Capillary: 113 mg/dL — ABNORMAL HIGH (ref 70–99)

## 2018-07-30 MED ORDER — LIDOCAINE HCL 1 % IJ SOLN
INTRAMUSCULAR | Status: AC
  Filled 2018-07-30: qty 10

## 2018-07-30 MED ORDER — HEPARIN (PORCINE) IN NACL 100-0.45 UNIT/ML-% IJ SOLN
1800.0000 [IU]/h | INTRAMUSCULAR | Status: DC
  Administered 2018-07-30 – 2018-08-01 (×4): 1800 [IU]/h via INTRAVENOUS
  Filled 2018-07-30 (×4): qty 250

## 2018-07-30 MED ORDER — DEXTROSE-NACL 5-0.45 % IV SOLN
INTRAVENOUS | Status: DC
  Administered 2018-07-30 – 2018-08-03 (×4): via INTRAVENOUS

## 2018-07-30 MED ORDER — MEGESTROL ACETATE 400 MG/10ML PO SUSP
400.0000 mg | Freq: Two times a day (BID) | ORAL | Status: DC
  Administered 2018-07-30 – 2018-08-02 (×6): 400 mg via ORAL
  Filled 2018-07-30 (×7): qty 10

## 2018-07-30 MED ORDER — HEPARIN BOLUS VIA INFUSION
2000.0000 [IU] | Freq: Once | INTRAVENOUS | Status: AC
  Administered 2018-07-30: 2000 [IU] via INTRAVENOUS
  Filled 2018-07-30: qty 2000

## 2018-07-30 NOTE — Progress Notes (Signed)
ANTICOAGULATION CONSULT NOTE - Consult  Pharmacy Consult for heparin drip Indication: VTE treatment  No Known Allergies  Patient Measurements: Height: 6' (182.9 cm) Weight: 149 lb 4 oz (67.7 kg) IBW/kg (Calculated) : 77.6 Heparin Dosing Weight: TBW  Vital Signs: Temp: 100.3 F (37.9 C) (08/23 1453) Temp Source: Oral (08/23 1453) BP: 134/89 (08/23 1616) Pulse Rate: 105 (08/23 1453)  Labs: Recent Labs    07/13/2018 0847  07/15/2018 1708 07/22/2018 2232  07/29/18 0510 07/29/18 1100 07/29/18 1439 07/30/18 0037 07/30/18 1015  HGB 9.3*  --  12.2*  --   --  8.0*  --   --  8.7*  --   HCT 29.8*  --  39.4  --   --  26.0*  --   --  28.8*  --   PLT 653*  --  656*  --   --  765*  --   --  609*  --   APTT  --   --  35  --   --   --   --   --   --   --   LABPROT  --   --  14.5  --   --   --   --   --   --   --   INR  --   --  1.14  --   --   --   --   --   --   --   HEPARINUNFRC  --   --   --   --    < > <0.10*  --  0.16* 0.15* 0.50  CREATININE 1.39*  --  1.54*  --   --  1.28*  --   --   --   --   TROPONINI  --    < > 0.03* <0.03  --  0.03* <0.03  --   --   --    < > = values in this interval not displayed.   Estimated Creatinine Clearance: 51.4 mL/min (A) (by C-G formula based on SCr of 1.28 mg/dL (H)).  Medical History: Past Medical History:  Diagnosis Date  . Cancer [facility])    bladder cancer  . GERD (gastroesophageal reflux disease)   . Hypertension    Assessment: Pharmacy consulted to dose and monitor heparin infusion in this 72 year old male for VTE treatment. Patient has PMH significant for bladder cancer with lung metastases and right IJ VTE (July 2019). Patient was on enoxaparin 80 mg subcutaneously q12h PTA (last dose 07/08/2018 @ 0600) and developed right leg DVT despite being on therapeutic LMWH. Patient was converted to a heparin drip on admission in preparation for thoracentesis. Baseline aPTT 35 seconds.  Baseline labs (07/12/2018):  -Hgb 12.2 (however, Hgb was 9.1 on  07/21/18) -Plt 656 -INR 1.1 -APTT 35 seconds  07/30/2018 HL 0.5, therapeutic on 1800 units/hr No s/s of bleed, d/w RN Hgb 8.7 (~baseline), Plts 609 Thoracentesis today resulted 930 ml fluid, Heparin held prior to procedure  Goal of Therapy:  Heparin level 0.3-0.7 units/ml Monitor platelets by anticoagulation protocol: Yes   Plan:   Resumed heparin drip at 1800 units/hr at 1800  Follow up Heparin level at 0200 tomorrow  Daily CBC, order daily Heparin level when at steady state  Monitor closely for any signs or symptoms of bleeding  Minda Ditto PharmD Pager [phone] 07/30/2018, 6:34 PM

## 2018-07-30 NOTE — Procedures (Signed)
Ultrasound-guided diagnostic and therapeutic right thoracentesis performed yielding 930 cc of yellow fluid. No immediate complications. Follow-up chest x-ray pending. The fluid was sent to the lab for preordered studies.

## 2018-07-30 NOTE — Progress Notes (Signed)
ANTICOAGULATION CONSULT NOTE - Consult  Pharmacy Consult for heparin drip Indication: VTE treatment  No Known Allergies  Patient Measurements: Height: 6' (182.9 cm) Weight: 149 lb 4 oz (67.7 kg) IBW/kg (Calculated) : 77.6 Heparin Dosing Weight: TBW  Vital Signs: Temp: 99.4 F (37.4 C) (08/23 0454) Temp Source: Oral (08/23 0454) BP: 140/88 (08/23 0454) Pulse Rate: 104 (08/23 0454)  Labs: Recent Labs    07/17/2018 0847  07/16/2018 1708 08/05/2018 2232  07/29/18 0510 07/29/18 1100 07/29/18 1439 07/30/18 0037 07/30/18 1015  HGB 9.3*  --  12.2*  --   --  8.0*  --   --  8.7*  --   HCT 29.8*  --  39.4  --   --  26.0*  --   --  28.8*  --   PLT 653*  --  656*  --   --  765*  --   --  609*  --   APTT  --   --  35  --   --   --   --   --   --   --   LABPROT  --   --  14.5  --   --   --   --   --   --   --   INR  --   --  1.14  --   --   --   --   --   --   --   HEPARINUNFRC  --   --   --   --    < > <0.10*  --  0.16* 0.15* 0.50  CREATININE 1.39*  --  1.54*  --   --  1.28*  --   --   --   --   TROPONINI  --    < > 0.03* <0.03  --  0.03* <0.03  --   --   --    < > = values in this interval not displayed.    Estimated Creatinine Clearance: 51.4 mL/min (A) (by C-G formula based on SCr of 1.28 mg/dL (H)).   Medical History: Past Medical History:  Diagnosis Date  . Cancer [facility])    bladder cancer  . GERD (gastroesophageal reflux disease)   . Hypertension    Assessment: Pharmacy consulted to dose and monitor heparin infusion in this 72 year old male for VTE treatment. Patient has PMH significant for bladder cancer with lung metastases and right IJ VTE (July 2019). Patient was on enoxaparin 80 mg subcutaneously q12h PTA (last dose 07/29/2018 @ 0600) and developed right leg DVT despite being on therapeutic LMWH. Patient was converted to a heparin drip on admission in preparation for thoracentesis. Baseline aPTT 35 seconds.  Baseline labs (07/16/2018):  -Hgb 12.2 (however, Hgb was 9.1 on  07/21/18) -Plt 656 -INR 1.1 -APTT 35 seconds  07/30/2018 HL 0.5, therapeutic on 1800 units/hr No s/s of bleed, d/w RN Hgb 8.7 (~baseline) Plts 609  Goal of Therapy:  Heparin level 0.3-0.7 units/ml Monitor platelets by anticoagulation protocol: Yes   Plan:   Continue heparin drip at 1800 units/hr  Check anti-Xa level in 8 hours and daily while on heparin  Continue to monitor H&H and platelets  Monitor closely for any signs or symptoms of bleeding  Dolly Rias RPh 07/30/2018, 10:55 AM Pager [phone]

## 2018-07-30 NOTE — Progress Notes (Signed)
Patient Demographics:    Jeremy Patrick, is a 72 y.o. male, DOB - October 20, 1946, [id number]  Admit date - 07/09/2018   Admitting Physician Toy Baker, MD  Outpatient Primary MD for the patient is [place], Malka So, MD  LOS - 2   Chief Complaint  Patient presents with  . Leg Pain        Subjective:    [name] today has no fevers, no emesis,  No chest pain,  Wife at bedside , poor appetite  Assessment  & Plan :    Principal Problem:   DVT (deep venous thrombosis) (HCC) Active Problems:   Essential hypertension   Gastroesophageal reflux disease without esophagitis   Malignant neoplasm of urinary bladder (HCC)   Elevated troponin   DVT (deep vein thrombosis) in pregnancy ([place])   Protein-calorie malnutrition, severe   Pleural effusion  Brief summary:- 72 y.o. male with medical history significant of metastatic bladder cancer, HTN,  Right IJ DVT (Dxed 06/23/18) on Lovenox, sadly has developed bilateral lower extremity DVT despite being compliant with Lovenox admitted on 07/21/2018    Plan:-  1)Hypercoagulable state-recurrent DVTs despite compliance with Lovenox, discussed with his oncologist Dr. Irene Limbo, continue IV heparin for now to allow for thoracentensis, possible discharge on Arixtra on 07/31/18, echo with EF of 65 to 70% with mild pulmonary hypertension, cannot rule out PE  2)Acute on chronic Anemia--- no evidence of acute bleeding, check stool occult blood, hemoglobin  today is 8.7,  but recently patient baseline hemoglobin is usually around 9. Hgb of 12.2 may have been erroneous (patient may have been hemoconcentrated/dehydrated), drop in hgb partly related to hemodilution . recurrent anemia requiring blood transfer last blood transfusion was on 07/21/18  3)Metastatic bladder cancer-- Patient recently has been switched from chemotherapy to immunotherapy and given dose of Pembrolizumab  on 08/03/2018  4)Social/Ethics-- Prognosis is poor, patient is a full code  5)Severe  Protein caloric malnutrition/Anorexia--- Give Megace  for appetite stimulation, give nutritional supplements  6) right-sided pleural effusion--- suspect malignancy related,s/p therapeutic thoracentensis on 07/30/2018 with removal of 1 L of fluid, cytology and other studies are pending  Code Status : Full    Disposition Plan  : Home   Consults  :  D/w Dr Irene Limbo   DVT Prophylaxis  :  Iv Heparin  Lab Results  Component Value Date   PLT 609 (H) 07/30/2018    Inpatient Medications  Scheduled Meds: . lactose free nutrition  237 mL Oral TID WC  . lidocaine      . mirtazapine  15 mg Oral QHS  . multivitamin with minerals  1 tablet Oral Daily  . senna-docusate  2 tablet Oral QHS   Continuous Infusions: . sodium chloride 10 mL/hr at 07/29/18 1846  . heparin Stopped (07/30/18 1501)   PRN Meds:.sodium chloride, acetaminophen **OR** acetaminophen, HYDROcodone-acetaminophen, ondansetron **OR** ondansetron (ZOFRAN) IV    Anti-infectives (From admission, onward)   None        Objective:   Vitals:   07/30/18 0454 07/30/18 1453 07/30/18 1609 07/30/18 1616  BP: 140/88 (!) 144/84 (!) 139/92 134/89  Pulse: (!) 104 (!) 105    Resp: 18 18    Temp: 99.4 F (37.4 C) 100.3 F (37.9 C)  TempSrc: Oral Oral    SpO2: 91% 95%    Weight:      Height:        Wt Readings from Last 3 Encounters:  08/04/2018 67.7 kg  07/21/18 67.7 kg  07/07/18 71.9 kg     Intake/Output Summary (Last 24 hours) at 07/30/2018 1658 Last data filed at 07/30/2018 1520 Gross per 24 hour  Intake 419 ml  Output 1200 ml  Net -781 ml     Physical Exam  Gen:- Awake Alert, thin and  chronically ill-appearing HEENT:- [place].AT, No sclera icterus Neck-Supple Neck,No JVD,.  Lungs-improved air movement on the right, no wheezing  CV- S1, S2 normal, regular Abd-  +ve B.Sounds, Abd Soft, No tenderness,    Extremity/Skin: c/o lower  extremity pain, pulses are positive  psych-affect is appropriate, oriented x3 Neuro-no new focal deficits, no tremors   Data Review:   Micro Results No results found for this or any previous visit (from the past 240 hour(s)).  Radiology Reports Dg Chest 2 View  Result Date: 07/17/2018 CLINICAL DATA:  Metastatic bladder CA EXAM: CHEST - 2 VIEW COMPARISON:  07/21/2018 FINDINGS: Cardiac shadow is stable. Multiple too numerous to number pulmonary metastatic lesions are identified. Associated right-sided pleural effusion and right lower lobe consolidation is seen similar to that noted on prior exam IMPRESSION: Changes consistent with known pulmonary metastatic disease. The overall appearance is similar to that seen on recent PET-CT. Electronically Signed   By: Inez Catalina M.D.   On: 07/29/2018 17:47   Nm Pet Image Restag (ps) Skull Base To Thigh  Result Date: 07/21/2018 CLINICAL DATA:  Small cell lung cancer treatment strategy for metastatic bladder cancer. EXAM: NUCLEAR MEDICINE PET SKULL BASE TO THIGH TECHNIQUE: 7.7 mCi F-18 FDG was injected intravenously. Full-ring PET imaging was performed from the skull base to thigh after the radiotracer. CT data was obtained and used for attenuation correction and anatomic localization. Fasting blood glucose: 150 mg/dl COMPARISON:  None. FINDINGS: Mediastinal blood pool activity: SUV max 2.7 NECK: No hypermetabolic lymph nodes in the neck. Incidental CT findings: none CHEST: Multiple hypermetabolic nodule pleural metastasis in the RIGHT hemithorax again demonstrated. The overall involvement of the pleural space extending into the RIGHT hilum and RIGHT subcarinal nodal station appears increased compared to prior. Example activity subcarinal lymph nodes are increased in volume with SUV max equal 7.8 compared to SUV max equal 5.5. Two round masses in the RIGHT upper lobe have decreased metabolic activity and mild increase in size. For example RIGHT upper lobe mass  measuring 3.5 cm SUV max equal 13.5 compares 3.2 cm mass with SUV max equal 21. RIGHT lower lobe mass measures 4.6 cm increased from 3.8 cm. Metabolic activity is more peripheral suggesting necrosis. RIGHT middle lobe mass measuring 4.7 cm compared to 4.4 cm. SUV max equal 13.7 compared 18.8. The pleural metastasis is slightly decreased in metabolic with activity SUV max equal 9.3 compared to SUV max equal 13. Pleural mass in the posterior RIGHT lung base measures 30 mm in thickness compared to 20 mm. Hypermetabolic nodules in the LEFT lung are similar with. For example LEFT lower lobe nodule measuring 2.1 cm compares to 2.1 cm with SUV max equal 6.9 compared to 6.3. Incidental CT findings: Choose one ABDOMEN/PELVIS: No abnormal hypermetabolic activity within the liver, pancreas, adrenal glands, or spleen. No hypermetabolic lymph nodes in the abdomen or pelvis. Incidental CT findings: none SKELETON: No focal hypermetabolic activity to suggest skeletal metastasis. Incidental CT findings: none IMPRESSION:  1. Overall evidence of progression of extensive pleural and parenchymal metastasis in the RIGHT hemithorax. 2. The large hypermetabolic RIGHT lung masses are decreased mildly in metabolic activity. However this size of lesions are increased. This finding could be seen with immunotherapy. 3. Likewise there is evidence of increase in size of pleural metastasis with increased extension into the subcarinal nodal station although again the metabolic activity is slightly decreased. Activity of these lesions me remains intense consistent with active malignancy. 4. Hypermetabolic metastatic pulmonary nodules in LEFT lung are stable. Electronically Signed   By: Suzy Bouchard M.D.   On: 07/21/2018 11:57     CBC Recent Labs  Lab 07/27/2018 0847 07/18/2018 1708 07/29/18 0510 07/30/18 0037  WBC 27.6* 21.1* 22.0* 21.5*  HGB 9.3* 12.2* 8.0* 8.7*  HCT 29.8* 39.4 26.0* 28.8*  PLT 653* 656* 765* 609*  MCV 83.6 86.0 85.8  87.0  MCH 26.2* 26.6 26.4 26.3  MCHC 31.3* 31.0 30.8 30.2  RDW 19.4* 19.3* 19.3* 19.3*  LYMPHSABS 2.3 2.5  --   --   MONOABS 4.6* 2.0*  --   --   EOSABS 0.0 0.0  --   --   BASOSABS 0.1 0.1  --   --     Chemistries  Recent Labs  Lab 07/23/2018 0847 07/12/2018 1708 07/29/18 0510  NA 140 144 142  K 4.5 4.5 4.2  CL 102 101 105  CO2 24 29 28   GLUCOSE 179* 132* 109*  BUN 21 27* 24*  CREATININE 1.39* 1.54* 1.28*  CALCIUM 9.1 9.3 8.5*  MG 2.0  --  2.2  AST 61* 69* 43*  ALT 27 33 23  ALKPHOS 125 120 92  BILITOT 0.6 0.6 0.5   ------------------------------------------------------------------------------------------------------------------ No results for input(s): CHOL, HDL, LDLCALC, TRIG, CHOLHDL, LDLDIRECT in the last 72 hours.  No results found for: HGBA1C ------------------------------------------------------------------------------------------------------------------ Recent Labs    07/29/18 0510  TSH 2.321   ------------------------------------------------------------------------------------------------------------------ No results for input(s): VITAMINB12, FOLATE, FERRITIN, TIBC, IRON, RETICCTPCT in the last 72 hours.  Coagulation profile Recent Labs  Lab 07/16/2018 1708  INR 1.14    No results for input(s): DDIMER in the last 72 hours.  Cardiac Enzymes Recent Labs  Lab 07/17/2018 2232 07/29/18 0510 07/29/18 1100  TROPONINI <0.03 0.03* <0.03   ------------------------------------------------------------------------------------------------------------------ No results found for: BNP   Roxan Hockey M.D on 07/30/2018 at 4:58 PM   Go to [email or web address] - password [email or web address] for contact info  [facility] - Office  [phone]

## 2018-07-30 NOTE — Progress Notes (Signed)
HEMATOLOGY/ONCOLOGY INPATIENT PROGRESS NOTE  Date of Service: 07/30/2018  Inpatient Attending: .Roxan Hockey, MD   SUBJECTIVE:    Jeremy Patrick is accompanied today by his partner. The pt reports that he is doing well overall.   The pt reports that he had been taking his Lovenox shots every day at home prior to his new DVT in his lower extremities. Currently on IV heparin as per the hospitalist.  I did discuss the patient's case with Dr. Joesph Fillers yesterday with a plan to continue IV heparin and hold it prior to ultrasound-guided thoracentesis today and then transition the patient to fondaparinux. Low likelihood of HIT syndrome given the absence of thrombocytopenia. Hemoglobin is stable today with no evidence of overt bleeding. Tolerated first dose of Keytruda without any acute issues.  The pt notes that he has been trying to eat better. He notes that he hasn't gotten out of bed much and is feeling a bit weak to walk around much. He adds that he has been moving his bowels and denies any other concerns.   Lab results today (07/30/18) of CBC is as follows: all values are WNL except for WBC at 21.5k, RBC at 3.31, HGB at 8.7, HCT at 28.8, RDW at 19.3, PLT at 609k.  On review of systems, pt reports moving his bowels, eating some, and denies abdominal pains, leg swelling, and any other symptoms.    OBJECTIVE:  NAD, appears weak.  PHYSICAL EXAMINATION: . Vitals:   07/29/18 1300 07/29/18 1803 07/29/18 2236 07/30/18 0454  BP:  (!) 156/93 (!) 143/75 140/88  Pulse:  96 (!) 106 (!) 104  Resp:  17 18 18   Temp: 99.7 F (37.6 C) 99 F (37.2 C) 98.5 F (36.9 C) 99.4 F (37.4 C)  TempSrc: Oral Oral Oral Oral  SpO2:  96% 92% 91%  Weight:      Height:       Filed Weights   07/15/2018 1707  Weight: 149 lb 4 oz (67.7 kg)   .Body mass index is 20.24 kg/m.  GENERAL:alert, appears weak and fatigued.  Cachectic appearing. SKIN: skin color, texture, turgor are normal, no rashes or  significant lesions EYES: normal, conjunctiva are pink and non-injected, sclera clear OROPHARYNX:no exudate, no erythema and lips, buccal mucosa, and tongue normal  NECK: supple, no JVD, thyroid normal size, non-tender, without nodularity LYMPH:  no palpable lymphadenopathy in the cervical, axillary or inguinal LUNGS: Decreased air entry on the right side  hEART: regular rate & rhythm,  no murmurs and no lower extremity edema ABDOMEN: abdomen soft, non-tender, normoactive bowel sounds  , condom catheter in situ. Musculoskeletal: no cyanosis of digits and no clubbing  PSYCH: alert & oriented x 3 with fluent speech NEURO: no focal motor/sensory deficits  MEDICAL HISTORY:  Past Medical History:  Diagnosis Date  . Cancer [facility])    bladder cancer  . GERD (gastroesophageal reflux disease)   . Hypertension     SURGICAL HISTORY: Past Surgical History:  Procedure Laterality Date  . BLADDER SURGERY    . CYSTOSCOPY N/A 04/06/2017   Procedure: CYSTOSCOPY;  Surgeon: Raynelle Bring, MD;  Location: [facility];  Service: Urology;  Laterality: N/A;  . CYSTOSCOPY W/ RETROGRADES N/A 01/28/2018   Procedure: CYSTOSCOPY WITH RETROGRADE PYELOGRAM;  Surgeon: Raynelle Bring, MD;  Location: [facility];  Service: Urology;  Laterality: N/A;  . CYSTOSCOPY W/ URETERAL STENT PLACEMENT N/A 10/08/2017   Procedure: CYSTOSCOPY WITH BILATERAL  RETROGRADE PYELOGRAM/ LEFT URETERAL STENT PLACEMENT;  Surgeon: Raynelle Bring, MD;  Location: [facility];  Service: Urology;  Laterality: N/A;  . IR CV LINE INJECTION  06/23/2018  . IR IMAGING GUIDED PORT INSERTION  05/21/2018  . IR REMOVAL TUN ACCESS W/ PORT W/O FL MOD SED  06/24/2018  . TRANSURETHRAL RESECTION OF BLADDER TUMOR N/A 04/06/2017   Procedure: TRANSURETHRAL RESECTION OF BLADDER TUMOR (TURBT);  Surgeon: Raynelle Bring, MD;  Location: [facility];  Service: Urology;  Laterality: N/A;  . TRANSURETHRAL RESECTION OF BLADDER TUMOR N/A 10/08/2017   Procedure: TRANSURETHRAL RESECTION OF BLADDER  TUMOR (TURBT);  Surgeon: Raynelle Bring, MD;  Location: [facility];  Service: Urology;  Laterality: N/A;  . TRANSURETHRAL RESECTION OF BLADDER TUMOR N/A 01/28/2018   Procedure: TRANSURETHRAL RESECTION OF BLADDER TUMOR (TURBT);  Surgeon: Raynelle Bring, MD;  Location: [facility];  Service: Urology;  Laterality: N/A;  GENERAL ANESTHESIA WITH PARALYSIS  . TRANSURETHRAL RESECTION OF BLADDER TUMOR WITH MITOMYCIN-C N/A 03/02/2017   Procedure: TRANSURETHRAL RESECTION OF BLADDER TUMOR/ EXAM UNDER ANESTHESIA/ WITH MITOMYCIN-C POST OPERATIVE;  Surgeon: Raynelle Bring, MD;  Location: [facility];  Service: Urology;  Laterality: N/A;    SOCIAL HISTORY: Social History   Socioeconomic History  . Marital status: Married    Spouse name: Not on file  . Number of children: 4  . Years of education: 10  . Highest education level: Not on file  Occupational History  . Occupation: Retired  Scientific laboratory technician  . Financial resource strain: Not on file  . Food insecurity:    Worry: Not on file    Inability: Not on file  . Transportation needs:    Medical: Not on file    Non-medical: Not on file  Tobacco Use  . Smoking status: Former Smoker    Packs/day: 0.15    Years: 10.00    Pack years: 1.50    Types: Cigarettes    Last attempt to quit: 1980    Years since quitting: 39.6  . Smokeless tobacco: Never Used  Substance and Sexual Activity  . Alcohol use: Not Currently  . Drug use: No    Comment: "years ago"  . Sexual activity: Not on file  Lifestyle  . Physical activity:    Days per week: Not on file    Minutes per session: Not on file  . Stress: Not on file  Relationships  . Social connections:    Talks on phone: Not on file    Gets together: Not on file    Attends religious service: Not on file    Active member of club or organization: Not on file    Attends meetings of clubs or organizations: Not on file    Relationship status: Not on file  . Intimate partner violence:    Fear of current or ex partner: Not on  file    Emotionally abused: Not on file    Physically abused: Not on file    Forced sexual activity: Not on file  Other Topics Concern  . Not on file  Social History Narrative   Fun/Hobby: Go to [employer] and travel.     FAMILY HISTORY: Family History  Problem Relation Age of Onset  . Hypertension Mother   . Stroke Mother   . Diabetes Mother   . Cataracts Mother   . Kidney disease Mother   . Hypertension Father   . Stroke Father   . Aneurysm Maternal Grandmother     ALLERGIES:  has No Known Allergies.  MEDICATIONS:  Scheduled Meds: . lactose free nutrition  237 mL Oral  TID WC  . mirtazapine  15 mg Oral QHS  . multivitamin with minerals  1 tablet Oral Daily  . senna-docusate  2 tablet Oral QHS   Continuous Infusions: . sodium chloride 10 mL/hr at 07/29/18 1846  . heparin 1,800 Units/hr (07/30/18 0218)   PRN Meds:.sodium chloride, acetaminophen **OR** acetaminophen, HYDROcodone-acetaminophen, ondansetron **OR** ondansetron (ZOFRAN) IV  REVIEW OF SYSTEMS:    10 Point review of Systems was done is negative except as noted above.   LABORATORY DATA:  I have reviewed the data as listed  . CBC Latest Ref Rng & Units 07/30/2018 07/29/2018 08/07/2018  WBC 4.0 - 10.5 K/uL 21.5(H) 22.0(H) 21.1(H)  Hemoglobin 13.0 - 17.0 g/dL 8.7(L) 8.0(L) 12.2(L)  Hematocrit 39.0 - 52.0 % 28.8(L) 26.0(L) 39.4  Platelets 150 - 400 K/uL 609(H) 765(H) 656(H)    . CMP Latest Ref Rng & Units 07/29/2018 07/11/2018 07/19/2018  Glucose 70 - 99 mg/dL 109(H) 132(H) 179(H)  BUN 8 - 23 mg/dL 24(H) 27(H) 21  Creatinine 0.61 - 1.24 mg/dL 1.28(H) 1.54(H) 1.39(H)  Sodium 135 - 145 mmol/L 142 144 140  Potassium 3.5 - 5.1 mmol/L 4.2 4.5 4.5  Chloride 98 - 111 mmol/L 105 101 102  CO2 22 - 32 mmol/L 28 29 24   Calcium 8.9 - 10.3 mg/dL 8.5(L) 9.3 9.1  Total Protein 6.5 - 8.1 g/dL 6.3(L) 7.9 7.2  Total Bilirubin 0.3 - 1.2 mg/dL 0.5 0.6 0.6  Alkaline Phos 38 - 126 U/L 92 120 125  AST 15 - 41 U/L 43(H) 69(H)  61(H)  ALT 0 - 44 U/L 23 33 27     RADIOGRAPHIC STUDIES: I have personally reviewed the radiological images as listed and agreed with the findings in the report. Dg Chest 2 View  Result Date: 08/01/2018 CLINICAL DATA:  Metastatic bladder CA EXAM: CHEST - 2 VIEW COMPARISON:  07/21/2018 FINDINGS: Cardiac shadow is stable. Multiple too numerous to number pulmonary metastatic lesions are identified. Associated right-sided pleural effusion and right lower lobe consolidation is seen similar to that noted on prior exam IMPRESSION: Changes consistent with known pulmonary metastatic disease. The overall appearance is similar to that seen on recent PET-CT. Electronically Signed   By: Inez Catalina M.D.   On: 07/10/2018 17:47   Nm Pet Image Restag (ps) Skull Base To Thigh  Result Date: 07/21/2018 CLINICAL DATA:  Small cell lung cancer treatment strategy for metastatic bladder cancer. EXAM: NUCLEAR MEDICINE PET SKULL BASE TO THIGH TECHNIQUE: 7.7 mCi F-18 FDG was injected intravenously. Full-ring PET imaging was performed from the skull base to thigh after the radiotracer. CT data was obtained and used for attenuation correction and anatomic localization. Fasting blood glucose: 150 mg/dl COMPARISON:  None. FINDINGS: Mediastinal blood pool activity: SUV max 2.7 NECK: No hypermetabolic lymph nodes in the neck. Incidental CT findings: none CHEST: Multiple hypermetabolic nodule pleural metastasis in the RIGHT hemithorax again demonstrated. The overall involvement of the pleural space extending into the RIGHT hilum and RIGHT subcarinal nodal station appears increased compared to prior. Example activity subcarinal lymph nodes are increased in volume with SUV max equal 7.8 compared to SUV max equal 5.5. Two round masses in the RIGHT upper lobe have decreased metabolic activity and mild increase in size. For example RIGHT upper lobe mass measuring 3.5 cm SUV max equal 13.5 compares 3.2 cm mass with SUV max equal 21. RIGHT  lower lobe mass measures 4.6 cm increased from 3.8 cm. Metabolic activity is more peripheral suggesting necrosis. RIGHT middle lobe mass measuring  4.7 cm compared to 4.4 cm. SUV max equal 13.7 compared 18.8. The pleural metastasis is slightly decreased in metabolic with activity SUV max equal 9.3 compared to SUV max equal 13. Pleural mass in the posterior RIGHT lung base measures 30 mm in thickness compared to 20 mm. Hypermetabolic nodules in the LEFT lung are similar with. For example LEFT lower lobe nodule measuring 2.1 cm compares to 2.1 cm with SUV max equal 6.9 compared to 6.3. Incidental CT findings: Choose one ABDOMEN/PELVIS: No abnormal hypermetabolic activity within the liver, pancreas, adrenal glands, or spleen. No hypermetabolic lymph nodes in the abdomen or pelvis. Incidental CT findings: none SKELETON: No focal hypermetabolic activity to suggest skeletal metastasis. Incidental CT findings: none IMPRESSION: 1. Overall evidence of progression of extensive pleural and parenchymal metastasis in the RIGHT hemithorax. 2. The large hypermetabolic RIGHT lung masses are decreased mildly in metabolic activity. However this size of lesions are increased. This finding could be seen with immunotherapy. 3. Likewise there is evidence of increase in size of pleural metastasis with increased extension into the subcarinal nodal station although again the metabolic activity is slightly decreased. Activity of these lesions me remains intense consistent with active malignancy. 4. Hypermetabolic metastatic pulmonary nodules in LEFT lung are stable. Electronically Signed   By: Suzy Bouchard M.D.   On: 07/21/2018 11:57    ASSESSMENT & PLAN:  72 y.o. male with  1. 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   The large hypermetabolic RIGHT lung masses are decreased mildly in metabolic activity. However this size of lesions are  increased. This finding could be seen with immunotherapy.  Likewise there is evidence of increase in size of pleural metastasis with increased extension into the subcarinal nodal station although again the metabolic activity is slightly decreased. Activity of these lesions me remains intense consistent with active malignancy. Hypermetabolic metastatic pulmonary nodules in LEFT lung are stable.  2. Likely malignant rt pleural effusion. - somewhat symptomatic   3.h/o acute DVT in rt IJV and cephalic vein.06/23/2018 - was on lovenox 4.h/o Acute DVT in RLE new 07/18/2018.  06/23/18 Vas [place] Upper Extremity revealed Evidence of acute DVT in the internal jugular vein, brachial vein and cephalic vein. Ultrasound findings are unable to discriminate whether obstruction in the internal jugular vein is acute or chronic. No evidence of DVT or SVT on left side.    07/16/2018 VAS US DVT revealed Right: There is evidence of acute DVT in the Femoral vein, Popliteal vein, and Gastrocnemius vein.  5. 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 Previous hgb of 12 likely from hemoconcentration 6. 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 R/o infection (no fever). Would need to r/o infection of rt pleural fluid.  8. Several protein calorie malnutrition  PLAN:  -Discussed pt labwork today, 07/30/18; HGB stable at 8.7, PLT at 609k -transfuse PRBC prn for hgb >8 -currently appropriately on IV Heparin -- hold 1h prior to US guided diagnostic and therapeutic rt sided thoracentesis. -post thoracentesis may switch to therapeutic fondaparinux which will be continued on discharge instead of lovenox. -low likelihood of HIT syndrome given lack of thrombocytopenia. -PT/OT evaluation to determine  home needs -nutritional consultation -Have referred pt to [facility] home [facility] - will need case  manager to f/u on this to help ensure this is setup prior to discharge. -ongoing goals of care discussion -- I have on several occassions broached the topic of switch to best supportive cares through hospice vs continued palliative immunotherapy if functional status allows. -case was discussed with Dr Joesph Fillers on 8/22  The total time spent in the appt was 35 minutes and more than 50% was on counseling and direct patient cares.    Sullivan Lone MD MS AAHIVMS [facility] Hematology/Oncology Physician [facility]  (Office):       [phone] (Work cell):  [phone] (Fax):           [phone]  07/30/2018 2:05 PM  I, Baldwin [place], am acting as a scribe for Dr. Irene Limbo  .I have reviewed the above documentation for accuracy and completeness, and I agree with the above. Sullivan Lone MD MS

## 2018-07-30 NOTE — Progress Notes (Signed)
ANTICOAGULATION CONSULT NOTE - Consult  Pharmacy Consult for heparin drip Indication: VTE treatment  No Known Allergies  Patient Measurements: Height: 6' (182.9 cm) Weight: 149 lb 4 oz (67.7 kg) IBW/kg (Calculated) : 77.6 Heparin Dosing Weight: TBW  Vital Signs: Temp: 98.5 F (36.9 C) (08/22 2236) Temp Source: Oral (08/22 2236) BP: 143/75 (08/22 2236) Pulse Rate: 106 (08/22 2236)  Labs: Recent Labs    07/21/2018 0847  07/15/2018 1708 08/03/2018 2232 07/29/18 0510 07/29/18 1100 07/29/18 1439 07/30/18 0037  HGB 9.3*  --  12.2*  --  8.0*  --   --  8.7*  HCT 29.8*  --  39.4  --  26.0*  --   --  28.8*  PLT 653*  --  656*  --  765*  --   --  609*  APTT  --   --  35  --   --   --   --   --   LABPROT  --   --  14.5  --   --   --   --   --   INR  --   --  1.14  --   --   --   --   --   HEPARINUNFRC  --   --   --   --  <0.10*  --  0.16* 0.15*  CREATININE 1.39*  --  1.54*  --  1.28*  --   --   --   TROPONINI  --    < > 0.03* <0.03 0.03* <0.03  --   --    < > = values in this interval not displayed.    Estimated Creatinine Clearance: 51.4 mL/min (A) (by C-G formula based on SCr of 1.28 mg/dL (H)).   Medical History: Past Medical History:  Diagnosis Date  . Cancer [facility]    bladder cancer  . GERD (gastroesophageal reflux disease)   . Hypertension    Assessment: Pharmacy consulted to dose and monitor heparin infusion in this 72 year old male for VTE treatment. Patient has PMH significant for bladder cancer with lung metastases and right IJ VTE (July 2019). Patient was on enoxaparin 80 mg subcutaneously q12h PTA (last dose 08/02/2018 @ 0600) and developed right leg DVT despite being on therapeutic LMWH. Patient was converted to a heparin drip on admission in preparation for thoracentesis.   Baseline labs (08/06/2018):  -Hgb 12.2 (however, Hgb was 9.1 on 07/21/18) -Plt 656 -INR 1.1 -APTT 35 seconds   8/22  Hgb 8 (decreased from yesterday) - discussed with RN and MD - no signs of  bleeding. Continue to monitor closely  Plt 765  HL 0.16 has increased but remains subtherapeutic on current rate of heparin 1300 units/hr. Confirmed with RN there have been no interruptions/issues with infusion (MAR has a pause charted @ 0831 but may be charting error as drip has not been interrupted today).  Today, 8/23  0037 HL = 0.15 still below goal, no infusion or bleeding issues per RN. Goal of Therapy:  Heparin level 0.3-0.7 units/ml Monitor platelets by anticoagulation protocol: Yes   Plan:   Give 2000 unit bolus  Increase heparin drip to 1800 units/hr  Check anti-Xa level in 8 hours and daily while on heparin  Continue to monitor H&H and platelets  Monitor closely for any signs or symptoms of bleeding  [name] 07/30/2018,2:00 AM

## 2018-07-31 LAB — CBC
HCT: 28.8 % — ABNORMAL LOW (ref 39.0–52.0)
Hemoglobin: 8.9 g/dL — ABNORMAL LOW (ref 13.0–17.0)
MCH: 26.6 pg (ref 26.0–34.0)
MCHC: 30.9 g/dL (ref 30.0–36.0)
MCV: 86.2 fL (ref 78.0–100.0)
Platelets: 641 10*3/uL — ABNORMAL HIGH (ref 150–400)
RBC: 3.34 MIL/uL — ABNORMAL LOW (ref 4.22–5.81)
RDW: 19.1 % — ABNORMAL HIGH (ref 11.5–15.5)
WBC: 20 10*3/uL — ABNORMAL HIGH (ref 4.0–10.5)

## 2018-07-31 LAB — URINALYSIS, ROUTINE W REFLEX MICROSCOPIC
Bilirubin Urine: NEGATIVE
Glucose, UA: 50 mg/dL — AB
Hgb urine dipstick: NEGATIVE
Ketones, ur: NEGATIVE mg/dL
Leukocytes, UA: NEGATIVE
Nitrite: NEGATIVE
Protein, ur: NEGATIVE mg/dL
Specific Gravity, Urine: 1.019 (ref 1.005–1.030)
pH: 5 (ref 5.0–8.0)

## 2018-07-31 LAB — BASIC METABOLIC PANEL
Anion gap: 11 (ref 5–15)
BUN: 15 mg/dL (ref 8–23)
CO2: 25 mmol/L (ref 22–32)
Calcium: 8.3 mg/dL — ABNORMAL LOW (ref 8.9–10.3)
Chloride: 106 mmol/L (ref 98–111)
Creatinine, Ser: 1.15 mg/dL (ref 0.61–1.24)
GFR calc Af Amer: >60 mL/min (ref >60)
GFR calc non Af Amer: >60 mL/min (ref >60)
Glucose, Bld: 123 mg/dL — ABNORMAL HIGH (ref 70–99)
Potassium: 4 mmol/L (ref 3.5–5.1)
Sodium: 142 mmol/L (ref 135–145)

## 2018-07-31 LAB — HEPARIN LEVEL (UNFRACTIONATED)
Heparin Unfractionated: 0.35 IU/mL (ref 0.30–0.70)
Heparin Unfractionated: 0.37 IU/mL (ref 0.30–0.70)

## 2018-07-31 MED ORDER — METOPROLOL TARTRATE 5 MG/5ML IV SOLN
5.0000 mg | INTRAVENOUS | Status: AC | PRN
  Administered 2018-07-31 – 2018-08-06 (×2): 5 mg via INTRAVENOUS
  Filled 2018-07-31 (×2): qty 5

## 2018-07-31 NOTE — Progress Notes (Signed)
ANTICOAGULATION CONSULT NOTE - Consult  Pharmacy Consult for heparin drip Indication: VTE treatment  No Known Allergies  Patient Measurements: Height: 6' (182.9 cm) Weight: 149 lb 4 oz (67.7 kg) IBW/kg (Calculated) : 77.6 Heparin Dosing Weight: TBW  Vital Signs:    Labs: Recent Labs    07/13/2018 1708 07/31/2018 2232 07/29/18 0510 07/29/18 1100  07/30/18 0037 07/30/18 1015 07/31/18 0135 07/31/18 1001  HGB 12.2*  --  8.0*  --   --  8.7*  --  8.9*  --   HCT 39.4  --  26.0*  --   --  28.8*  --  28.8*  --   PLT 656*  --  765*  --   --  609*  --  641*  --   APTT 35  --   --   --   --   --   --   --   --   LABPROT 14.5  --   --   --   --   --   --   --   --   INR 1.14  --   --   --   --   --   --   --   --   HEPARINUNFRC  --   --  <0.10*  --    < > 0.15* 0.50 0.35 0.37  CREATININE 1.54*  --  1.28*  --   --   --   --  1.15  --   TROPONINI 0.03* <0.03 0.03* <0.03  --   --   --   --   --    < > = values in this interval not displayed.   Estimated Creatinine Clearance: 57.2 mL/min (by C-G formula based on SCr of 1.15 mg/dL).  Medical History: Past Medical History:  Diagnosis Date  . Cancer [facility])    bladder cancer  . GERD (gastroesophageal reflux disease)   . Hypertension    Assessment: Pharmacy consulted to dose and monitor heparin infusion in this 72 year old male for VTE treatment. Patient has PMH significant for bladder cancer with lung metastases and right IJ VTE (July 2019). Patient was on enoxaparin 80 mg subcutaneously q12h PTA (last dose 07/10/2018 @ 0600) and developed right leg DVT despite being on therapeutic LMWH. Patient was converted to a heparin drip on admission in preparation for thoracentesis. Baseline aPTT 35 seconds.  Baseline labs (07/19/2018):  -Hgb 12.2 (however, Hgb was 9.1 on 07/21/18) -Plt 656 -INR 1.1 -APTT 35 seconds  07/31/2018 HL 0.37, therapeutic on 1800 units/hr No s/s of bleed, d/w RN Hgb 8.9 (~baseline), Plts 641   Goal of Therapy:  Heparin  level 0.3-0.7 units/ml Monitor platelets by anticoagulation protocol: Yes   Plan:   Continue heparin drip at 1800 units/hr  Daily CBC,  daily Heparin   Monitor closely for any signs or symptoms of bleeding  F/u tx to fondaparinux per onc note   Dolly Rias RPh 07/31/2018, 10:44 AM Pager [phone]

## 2018-07-31 NOTE — Progress Notes (Signed)
Patient Demographics:    Jeremy Patrick, is a 72 y.o. male, DOB - Aug 17, 1946, LHT:[id number]  Admit date - 08/07/2018   Admitting Physician Toy Baker, MD  Outpatient Primary MD for the patient is [place], Malka So, MD  LOS - 3   Chief Complaint  Patient presents with  . Leg Pain        Subjective:    Jeremy Patrick today has no fevers, no emesis,  No chest pain,  Wife at bedside , still not eating well, fever up to 103.2  Assessment  & Plan :    Principal Problem:   DVT (deep venous thrombosis) (HCC) Active Problems:   Essential hypertension   Gastroesophageal reflux disease without esophagitis   Malignant neoplasm of urinary bladder (HCC)   Elevated troponin   DVT (deep vein thrombosis) in pregnancy ([place])   Protein-calorie malnutrition, severe   Pleural effusion  Brief summary:- 72 y.o. male with medical history significant of metastatic bladder cancer, HTN,  Right IJ DVT (Dxed 06/23/18) on Lovenox, sadly has developed bilateral lower extremity DVT despite being compliant with Lovenox admitted on 07/30/2018    Plan:-  1)Hypercoagulable state-recurrent DVTs despite compliance with Lovenox, discussed with his oncologist Dr. Irene Limbo, continue IV heparin for now given fevers and possible need for further diagnostic testing, patient may need repeat Rt sided  thoracentensis, plan is for discharge on Arixtra once acute issues resolve, echo with EF of 65 to 70% with mild pulmonary hypertension, cannot rule out PE  2)Acute on chronic Anemia--- no evidence of acute bleeding, check stool occult blood, hemoglobin  today is 8.9,    patient baseline hemoglobin is usually around 9. Hgb of 12.2 may have been erroneous (patient may have been hemoconcentrated/dehydrated), drop in hgb partly related to hemodilution . recurrent anemia requiring blood transfer last blood transfusion was on 07/21/18  3)Metastatic  bladder cancer-- Patient recently has been switched from chemotherapy to immunotherapy and given dose of Pembrolizumab on 07/13/2018  4)Social/Ethics-- Prognosis is poor, patient is a full code  5)Severe  Protein caloric malnutrition/Anorexia--- c/n  Megace  for appetite stimulation, c/n nutritional supplements  6) right-sided pleural effusion--- suspect malignancy related, s/p therapeutic thoracentensis on 07/30/2018 with removal of 1 L of fluid, cytology and other studies are pending  7)Fevers --fevers up to 103.2, white count stable around 20,000, get blood cultures, check UA, repeat chest x-ray, hold off on antibiotics for now  Code Status : Full    Disposition Plan  : Home with HH  Consults  :  D/w Dr Irene Limbo   DVT Prophylaxis  :  Iv Heparin  Lab Results  Component Value Date   PLT 641 (H) 07/31/2018    Inpatient Medications  Scheduled Meds: . lactose free nutrition  237 mL Oral TID WC  . megestrol  400 mg Oral BID  . mirtazapine  15 mg Oral QHS  . multivitamin with minerals  1 tablet Oral Daily  . senna-docusate  2 tablet Oral QHS   Continuous Infusions: . dextrose 5 % and 0.45% NaCl 40 mL/hr at 07/31/18 1619  . heparin 1,800 Units/hr (07/31/18 1301)   PRN Meds:.acetaminophen **OR** acetaminophen, HYDROcodone-acetaminophen, ondansetron **OR** ondansetron (ZOFRAN) IV    Anti-infectives (From admission, onward)   None  Objective:   Vitals:   07/30/18 1609 07/30/18 1616 07/30/18 2127 07/31/18 1500  BP: (!) 139/92 134/89 (!) 160/89 (!) 145/88  Pulse:   (!) 105 (!) 116  Resp:   18 (!) 24  Temp:   98.8 F (37.1 C) (!) 103.1 F (39.5 C)  TempSrc:   Oral Oral  SpO2:   97% 95%  Weight:      Height:        Wt Readings from Last 3 Encounters:  07/20/2018 67.7 kg  07/21/18 67.7 kg  07/07/18 71.9 kg     Intake/Output Summary (Last 24 hours) at 07/31/2018 1920 Last data filed at 07/31/2018 0650 Gross per 24 hour  Intake 232 ml  Output 400 ml  Net -168 ml       Physical Exam  Gen:- Awake Alert, thin and  chronically ill-appearing HEENT:- [place].AT, No sclera icterus Neck-Supple Neck,No JVD,.  Lungs-improved air movement on the right, no wheezing  CV- S1, S2 normal, regular Abd-  +ve B.Sounds, Abd Soft, No tenderness,    Extremity/Skin: c/o lower extremity pain, pulses are positive  psych-affect is appropriate, oriented x3 Neuro-no new focal deficits, no tremors   Data Review:   Micro Results Recent Results (from the past 240 hour(s))  Culture, body fluid-bottle     Status: None (Preliminary result)   Collection Time: 07/30/18  4:57 PM  Result Value Ref Range Status   Specimen Description PLEURAL  Final   Special Requests FLUID  Final   Culture   Final    NO GROWTH < 24 HOURS Performed at [facility], [place] [address], [place], [place] [address]    Report Status PENDING  Incomplete    Radiology Reports Dg Chest 1 View  Result Date: 07/30/2018 CLINICAL DATA:  Post thoracentesis.  Metastatic disease. EXAM: CHEST  1 VIEW COMPARISON:  Ultrasound 07/30/2018.  Chest x-ray 12/27/2017. FINDINGS: Cardiomegaly. Bilateral pulmonary mass lesions again noted. Diminished right-sided pleural effusion post thoracentesis. No evidence of pneumothorax. IMPRESSION: 1.  No evidence of pneumothorax post thoracentesis. 2.  Multiple bilateral pulmonary mass lesions again noted. Electronically Signed   By: Marcello Moores  Register   On: 07/30/2018 17:04   Dg Chest 2 View  Result Date: 07/29/2018 CLINICAL DATA:  Metastatic bladder CA EXAM: CHEST - 2 VIEW COMPARISON:  07/21/2018 FINDINGS: Cardiac shadow is stable. Multiple too numerous to number pulmonary metastatic lesions are identified. Associated right-sided pleural effusion and right lower lobe consolidation is seen similar to that noted on prior exam IMPRESSION: Changes consistent with known pulmonary metastatic disease. The overall appearance is similar to that seen on recent PET-CT. Electronically Signed    By: Inez Catalina M.D.   On: 08/01/2018 17:47   Nm Pet Image Restag (ps) Skull Base To Thigh  Result Date: 07/21/2018 CLINICAL DATA:  Small cell lung cancer treatment strategy for metastatic bladder cancer. EXAM: NUCLEAR MEDICINE PET SKULL BASE TO THIGH TECHNIQUE: 7.7 mCi F-18 FDG was injected intravenously. Full-ring PET imaging was performed from the skull base to thigh after the radiotracer. CT data was obtained and used for attenuation correction and anatomic localization. Fasting blood glucose: 150 mg/dl COMPARISON:  None. FINDINGS: Mediastinal blood pool activity: SUV max 2.7 NECK: No hypermetabolic lymph nodes in the neck. Incidental CT findings: none CHEST: Multiple hypermetabolic nodule pleural metastasis in the RIGHT hemithorax again demonstrated. The overall involvement of the pleural space extending into the RIGHT hilum and RIGHT subcarinal nodal station appears increased compared to prior. Example activity subcarinal  lymph nodes are increased in volume with SUV max equal 7.8 compared to SUV max equal 5.5. Two round masses in the RIGHT upper lobe have decreased metabolic activity and mild increase in size. For example RIGHT upper lobe mass measuring 3.5 cm SUV max equal 13.5 compares 3.2 cm mass with SUV max equal 21. RIGHT lower lobe mass measures 4.6 cm increased from 3.8 cm. Metabolic activity is more peripheral suggesting necrosis. RIGHT middle lobe mass measuring 4.7 cm compared to 4.4 cm. SUV max equal 13.7 compared 18.8. The pleural metastasis is slightly decreased in metabolic with activity SUV max equal 9.3 compared to SUV max equal 13. Pleural mass in the posterior RIGHT lung base measures 30 mm in thickness compared to 20 mm. Hypermetabolic nodules in the LEFT lung are similar with. For example LEFT lower lobe nodule measuring 2.1 cm compares to 2.1 cm with SUV max equal 6.9 compared to 6.3. Incidental CT findings: Choose one ABDOMEN/PELVIS: No abnormal hypermetabolic activity within the  liver, pancreas, adrenal glands, or spleen. No hypermetabolic lymph nodes in the abdomen or pelvis. Incidental CT findings: none SKELETON: No focal hypermetabolic activity to suggest skeletal metastasis. Incidental CT findings: none IMPRESSION: 1. Overall evidence of progression of extensive pleural and parenchymal metastasis in the RIGHT hemithorax. 2. The large hypermetabolic RIGHT lung masses are decreased mildly in metabolic activity. However this size of lesions are increased. This finding could be seen with immunotherapy. 3. Likewise there is evidence of increase in size of pleural metastasis with increased extension into the subcarinal nodal station although again the metabolic activity is slightly decreased. Activity of these lesions me remains intense consistent with active malignancy. 4. Hypermetabolic metastatic pulmonary nodules in LEFT lung are stable. Electronically Signed   By: Suzy Bouchard M.D.   On: 07/21/2018 11:57     CBC Recent Labs  Lab 07/10/2018 0847 07/11/2018 1708 07/29/18 0510 07/30/18 0037 07/31/18 0135  WBC 27.6* 21.1* 22.0* 21.5* 20.0*  HGB 9.3* 12.2* 8.0* 8.7* 8.9*  HCT 29.8* 39.4 26.0* 28.8* 28.8*  PLT 653* 656* 765* 609* 641*  MCV 83.6 86.0 85.8 87.0 86.2  MCH 26.2* 26.6 26.4 26.3 26.6  MCHC 31.3* 31.0 30.8 30.2 30.9  RDW 19.4* 19.3* 19.3* 19.3* 19.1*  LYMPHSABS 2.3 2.5  --   --   --   MONOABS 4.6* 2.0*  --   --   --   EOSABS 0.0 0.0  --   --   --   BASOSABS 0.1 0.1  --   --   --     Chemistries  Recent Labs  Lab 07/27/2018 0847 07/22/2018 1708 07/29/18 0510 07/31/18 0135  NA 140 144 142 142  K 4.5 4.5 4.2 4.0  CL 102 101 105 106  CO2 24 29 28 25   GLUCOSE 179* 132* 109* 123*  BUN 21 27* 24* 15  CREATININE 1.39* 1.54* 1.28* 1.15  CALCIUM 9.1 9.3 8.5* 8.3*  MG 2.0  --  2.2  --   AST 61* 69* 43*  --   ALT 27 33 23  --   ALKPHOS 125 120 92  --   BILITOT 0.6 0.6 0.5  --     ------------------------------------------------------------------------------------------------------------------ No results for input(s): CHOL, HDL, LDLCALC, TRIG, CHOLHDL, LDLDIRECT in the last 72 hours.  No results found for: HGBA1C ------------------------------------------------------------------------------------------------------------------ Recent Labs    07/29/18 0510  TSH 2.321   ------------------------------------------------------------------------------------------------------------------ No results for input(s): VITAMINB12, FOLATE, FERRITIN, TIBC, IRON, RETICCTPCT in the last 72 hours.  Coagulation profile  Recent Labs  Lab 08/05/2018 1708  INR 1.14    No results for input(s): DDIMER in the last 72 hours.  Cardiac Enzymes Recent Labs  Lab 07/27/2018 2232 07/29/18 0510 07/29/18 1100  TROPONINI <0.03 0.03* <0.03   ------------------------------------------------------------------------------------------------------------------ No results found for: BNP   Roxan Hockey M.D on 07/31/2018 at 7:20 PM   Go to [email or web address] - password [email or web address] for contact info  [facility] - Office  [phone]

## 2018-07-31 NOTE — Progress Notes (Signed)
ANTICOAGULATION CONSULT NOTE - Consult  Pharmacy Consult for heparin drip Indication: VTE treatment  No Known Allergies  Patient Measurements: Height: 6' (182.9 cm) Weight: 149 lb 4 oz (67.7 kg) IBW/kg (Calculated) : 77.6 Heparin Dosing Weight: TBW  Vital Signs: Temp: 98.8 F (37.1 C) (08/23 2127) Temp Source: Oral (08/23 2127) BP: 160/89 (08/23 2127) Pulse Rate: 105 (08/23 2127)  Labs: Recent Labs    07/20/2018 0847  07/13/2018 1708 07/15/2018 2232 07/29/18 0510 07/29/18 1100  07/30/18 0037 07/30/18 1015 07/31/18 0135  HGB 9.3*  --  12.2*  --  8.0*  --   --  8.7*  --  8.9*  HCT 29.8*  --  39.4  --  26.0*  --   --  28.8*  --  28.8*  PLT 653*  --  656*  --  765*  --   --  609*  --  641*  APTT  --   --  35  --   --   --   --   --   --   --   LABPROT  --   --  14.5  --   --   --   --   --   --   --   INR  --   --  1.14  --   --   --   --   --   --   --   HEPARINUNFRC  --   --   --   --  <0.10*  --    < > 0.15* 0.50 0.35  CREATININE 1.39*  --  1.54*  --  1.28*  --   --   --   --   --   TROPONINI  --    < > 0.03* <0.03 0.03* <0.03  --   --   --   --    < > = values in this interval not displayed.   Estimated Creatinine Clearance: 51.4 mL/min (A) (by C-G formula based on SCr of 1.28 mg/dL (H)).  Medical History: Past Medical History:  Diagnosis Date  . Cancer [facility])    bladder cancer  . GERD (gastroesophageal reflux disease)   . Hypertension    Assessment: Pharmacy consulted to dose and monitor heparin infusion in this 72 year old male for VTE treatment. Patient has PMH significant for bladder cancer with lung metastases and right IJ VTE (July 2019). Patient was on enoxaparin 80 mg subcutaneously q12h PTA (last dose 07/27/2018 @ 0600) and developed right leg DVT despite being on therapeutic LMWH. Patient was converted to a heparin drip on admission in preparation for thoracentesis. Baseline aPTT 35 seconds.  Baseline labs (07/20/2018):  -Hgb 12.2 (however, Hgb was 9.1 on  07/21/18) -Plt 656 -INR 1.1 -APTT 35 seconds  8/23 HL 0.5, therapeutic on 1800 units/hr No s/s of bleed, d/w RN Hgb 8.7 (~baseline), Plts 609 Thoracentesis today resulted 930 ml fluid, Heparin held prior to procedure Today, 8/24 0135 HL=.35 at goal, no bleeding or infusion issues per RN.  Goal of Therapy:  Heparin level 0.3-0.7 units/ml Monitor platelets by anticoagulation protocol: Yes   Plan:   Continue heparin drip at 1800 units/hr  Recheck HL at 10 am today to ensure stays in therapeutic range  Daily CBC, order daily Heparin level when at steady state  Monitor closely for any signs or symptoms of bleeding   [name] 07/31/2018, 2:10 AM

## 2018-07-31 NOTE — Plan of Care (Signed)
VSS, patient continues with very minimal po intake (a few sips of liquid here and there), very flat affect, withdrawn.  Patient refusing to be turned and repositioned at times, refuses pillows under heels.  Does reposition self minimally in bed.  Discussed with patient the importance of above measures to prevent skin breakdown which patient says he knows all this.  T max of 103.1 this shift, MD notified, orders received.  Wife at bedside.

## 2018-08-01 ENCOUNTER — Inpatient Hospital Stay (HOSPITAL_COMMUNITY): Payer: Medicare Other

## 2018-08-01 LAB — COMPREHENSIVE METABOLIC PANEL
ALT: 30 U/L (ref 0–44)
AST: 77 U/L — ABNORMAL HIGH (ref 15–41)
Albumin: 2 g/dL — ABNORMAL LOW (ref 3.5–5.0)
Alkaline Phosphatase: 119 U/L (ref 38–126)
Anion gap: 9 (ref 5–15)
BUN: 13 mg/dL (ref 8–23)
CO2: 25 mmol/L (ref 22–32)
Calcium: 8.1 mg/dL — ABNORMAL LOW (ref 8.9–10.3)
Chloride: 109 mmol/L (ref 98–111)
Creatinine, Ser: 1.11 mg/dL (ref 0.61–1.24)
GFR calc Af Amer: >60 mL/min (ref >60)
GFR calc non Af Amer: >60 mL/min (ref >60)
Glucose, Bld: 135 mg/dL — ABNORMAL HIGH (ref 70–99)
Potassium: 3.7 mmol/L (ref 3.5–5.1)
Sodium: 143 mmol/L (ref 135–145)
Total Bilirubin: 0.9 mg/dL (ref 0.3–1.2)
Total Protein: 5.9 g/dL — ABNORMAL LOW (ref 6.5–8.1)

## 2018-08-01 LAB — CBC WITH DIFFERENTIAL/PLATELET
Basophils Absolute: 0 10*3/uL (ref 0.0–0.1)
Basophils Relative: 0 %
Eosinophils Absolute: 0 10*3/uL (ref 0.0–0.7)
Eosinophils Relative: 0 %
HCT: 25.7 % — ABNORMAL LOW (ref 39.0–52.0)
Hemoglobin: 7.9 g/dL — ABNORMAL LOW (ref 13.0–17.0)
Lymphocytes Relative: 9 %
Lymphs Abs: 2.5 10*3/uL (ref 0.7–4.0)
MCH: 26.7 pg (ref 26.0–34.0)
MCHC: 30.7 g/dL (ref 30.0–36.0)
MCV: 86.8 fL (ref 78.0–100.0)
Monocytes Absolute: 5.9 10*3/uL — ABNORMAL HIGH (ref 0.1–1.0)
Monocytes Relative: 21 %
Neutro Abs: 19.7 10*3/uL — ABNORMAL HIGH (ref 1.7–7.7)
Neutrophils Relative %: 70 %
Platelets: 571 10*3/uL — ABNORMAL HIGH (ref 150–400)
RBC: 2.96 MIL/uL — ABNORMAL LOW (ref 4.22–5.81)
RDW: 18.8 % — ABNORMAL HIGH (ref 11.5–15.5)
WBC: 28.1 10*3/uL — ABNORMAL HIGH (ref 4.0–10.5)

## 2018-08-01 LAB — HEPARIN LEVEL (UNFRACTIONATED)
Heparin Unfractionated: 0.29 IU/mL — ABNORMAL LOW (ref 0.30–0.70)
Heparin Unfractionated: 0.34 IU/mL (ref 0.30–0.70)

## 2018-08-01 MED ORDER — BOOST / RESOURCE BREEZE PO LIQD CUSTOM
1.0000 | Freq: Two times a day (BID) | ORAL | Status: DC
  Administered 2018-08-01: 1 via ORAL

## 2018-08-01 MED ORDER — SODIUM CHLORIDE 0.9 % IV SOLN
2.0000 g | Freq: Two times a day (BID) | INTRAVENOUS | Status: DC
  Administered 2018-08-01 – 2018-08-02 (×2): 2 g via INTRAVENOUS
  Filled 2018-08-01 (×3): qty 2

## 2018-08-01 MED ORDER — ALUM & MAG HYDROXIDE-SIMETH 200-200-20 MG/5ML PO SUSP
30.0000 mL | ORAL | Status: DC | PRN
  Administered 2018-08-01 – 2018-08-02 (×3): 30 mL via ORAL
  Filled 2018-08-01 (×3): qty 30

## 2018-08-01 MED ORDER — VANCOMYCIN HCL IN DEXTROSE 1-5 GM/200ML-% IV SOLN: 1000.0000 mg | Freq: Once | INTRAVENOUS | Status: DC

## 2018-08-01 MED ORDER — HEPARIN (PORCINE) IN NACL 100-0.45 UNIT/ML-% IJ SOLN
2100.0000 [IU]/h | INTRAMUSCULAR | Status: DC
  Administered 2018-08-01 – 2018-08-02 (×4): 2000 [IU]/h via INTRAVENOUS
  Administered 2018-08-03 – 2018-08-04 (×4): 2100 [IU]/h via INTRAVENOUS
  Filled 2018-08-01 (×8): qty 250

## 2018-08-01 MED ORDER — VANCOMYCIN HCL 10 G IV SOLR
1500.0000 mg | Freq: Once | INTRAVENOUS | Status: AC
  Administered 2018-08-01: 1500 mg via INTRAVENOUS
  Filled 2018-08-01: qty 1500

## 2018-08-01 MED ORDER — BISMUTH SUBSALICYLATE 262 MG/15ML PO SUSP
30.0000 mL | ORAL | Status: DC | PRN
  Administered 2018-08-02: 30 mL via ORAL
  Filled 2018-08-01: qty 236

## 2018-08-01 MED ORDER — VANCOMYCIN HCL IN DEXTROSE 1-5 GM/200ML-% IV SOLN: 1000.0000 mg | INTRAVENOUS | Status: DC

## 2018-08-01 NOTE — Progress Notes (Signed)
Pharmacy Antibiotic Note  Jeremy Patrick is a 72 y.o. male with metatatatic lung cancer known to pharmacy from current heparin consult.  To start vancomycin on 8/25 for fever and leukocytosis.  - scr 1.11 (crcl~59)  Plan: - vancomycin 1500 mg IVx1, then 1000 mg IV q24h for est AUC 424 - cefepime 2gm IV q12h per MD  ______________________________  Height: 6' (182.9 cm) Weight: 149 lb 4 oz (67.7 kg) IBW/kg (Calculated) : 77.6  Temp (24hrs), Avg:99.9 F (37.7 C), Min:97.7 F (36.5 C), Max:103.1 F (39.5 C)  Recent Labs  Lab 07/27/2018 0847 07/08/2018 1708 07/29/18 0510 07/30/18 0037 07/31/18 0135 08/01/18 0443  WBC 27.6* 21.1* 22.0* 21.5* 20.0* 28.1*  CREATININE 1.39* 1.54* 1.28*  --  1.15 1.11    Estimated Creatinine Clearance: 59.3 mL/min (by C-G formula based on SCr of 1.11 mg/dL).    No Known Allergies   Thank you for allowing pharmacy to be a part of this patient's care.  Lynelle Doctor 08/01/2018 2:23 PM

## 2018-08-01 NOTE — Progress Notes (Signed)
Patient Demographics:    Jeremy Patrick, is a 72 y.o. male, DOB - 1946-11-22, IZT:[id number]  Admit date - 07/08/2018   Admitting Physician Toy Baker, MD  Outpatient Primary MD for the patient is [place], Malka So, MD  LOS - 4   Chief Complaint  Patient presents with  . Leg Pain        Subjective:    Jeremy Patrick today continues to spike fevers, no emesis, no chest pain.  Wife at bedside. Poor appetite persists. Has groin and upper thigh pain during transfers.    Assessment  & Plan :    Principal Problem:   DVT (deep venous thrombosis) (HCC) Active Problems:   Essential hypertension   Gastroesophageal reflux disease without esophagitis   Malignant neoplasm of urinary bladder (HCC)   Elevated troponin   DVT (deep vein thrombosis) in pregnancy ([place])   Protein-calorie malnutrition, severe   Pleural effusion  Brief summary:- 72 y.o. male with medical history significant of metastatic bladder cancer, HTN,  Right IJ DVT (Dxed 06/23/18) on Lovenox, sadly has developed bilateral lower extremity DVT despite being compliant with Lovenox admitted on 07/13/2018. Spiking fevers.     Plan:-  #Hypercoagulable state-recurrent DVTs despite compliance with Lovenox, discussed with his oncologist Dr. Irene Limbo, continue IV heparin for now given fevers and possible need for further diagnostic testing, patient may need repeat Rt sided  thoracentensis, plan is for discharge on Arixtra once acute issues resolve, echo with EF of 65 to 70% with mild pulmonary hypertension, cannot rule out PE.  - on heparin drip  #Acute on chronic Anemia--- no evidence of acute bleeding, check stool occult blood, hemoglobin  today is 8.9,    patient baseline hemoglobin is usually around 9. Hgb of 12.2 may have been erroneous (patient may have been hemoconcentrated/dehydrated), drop in hgb partly related to hemodilution . recurrent  anemia requiring blood transfer last blood transfusion was on 07/21/18  #Metastatic bladder cancer-- Patient recently has been switched from chemotherapy to immunotherapy and given dose of Pembrolizumab on 08/01/2018  #Fevers -- still spiking temps up to 103, white count slowly increasing. Unclear source: resp (pleural effusion) vs inflammatory related to DVT vs infected emboli  > blood cultures on 8/24 in process; pleural effusion 8/23 NGTD - starting empiric IV vanc and cefepime D1 8/25 - prn tylenol - if continues to be febrile despite abx, reculture and plan to re-tap pleural effusion  #Social/Ethics-- Prognosis is poor, patient is a full code  #Severe  Protein caloric malnutrition/Anorexia--- c/n  Megace  for appetite stimulation, c/n nutritional supplements  # right-sided pleural effusion--- suspect malignancy related, s/p therapeutic thoracentensis on 07/30/2018 with removal of 1 L of fluid, cytology and other studies are pending   Code Status : Full    Disposition Plan  : Home with HH  Consults  :  D/w Dr Irene Limbo   DVT Prophylaxis  :  Iv Heparin  Lab Results  Component Value Date   PLT 571 (H) 08/01/2018    Inpatient Medications  Scheduled Meds: . feeding supplement  1 Container Oral BID BM  . lactose free nutrition  237 mL Oral TID WC  . megestrol  400 mg Oral BID  . mirtazapine  15 mg Oral QHS  .  multivitamin with minerals  1 tablet Oral Daily  . senna-docusate  2 tablet Oral QHS   Continuous Infusions: . dextrose 5 % and 0.45% NaCl 40 mL/hr at 08/01/18 0200  . heparin     PRN Meds:.acetaminophen **OR** acetaminophen, HYDROcodone-acetaminophen, metoprolol tartrate, ondansetron **OR** ondansetron (ZOFRAN) IV    Anti-infectives (From admission, onward)   None        Objective:   Vitals:   07/31/18 2044 07/31/18 2151 08/01/18 0406 08/01/18 0735  BP: (!) 185/99 137/84 132/83   Pulse: (!) 109 90 (!) 118   Resp: (!) 24  16   Temp: 98.9 F (37.2 C)  (!) 102.2  F (39 C) 97.8 F (36.6 C)  TempSrc: Oral  Oral Oral  SpO2: 96%  94%   Weight:      Height:        Wt Readings from Last 3 Encounters:  07/10/2018 67.7 kg  07/21/18 67.7 kg  07/07/18 71.9 kg     Intake/Output Summary (Last 24 hours) at 08/01/2018 1405 Last data filed at 08/01/2018 1130 Gross per 24 hour  Intake 1191.94 ml  Output 300 ml  Net 891.94 ml     Physical Exam  Gen:- Awake Alert, thin and  chronically ill-appearing HEENT:- [place].AT, No sclera icterus Neck-Supple Neck,No JVD,.  Lungs- diminished at R base. No wheezing.  CV- S1, S2 normal, regular Abd-  +ve B.Sounds, Abd Soft, No tenderness,    Extremity/Skin: c/o lower extremity pain, pulses are positive  psych-affect is appropriate, oriented x3 Neuro-no new focal deficits, no tremors   Data Review:   Micro Results Recent Results (from the past 240 hour(s))  Culture, body fluid-bottle     Status: None (Preliminary result)   Collection Time: 07/30/18  4:57 PM  Result Value Ref Range Status   Specimen Description PLEURAL  Final   Special Requests FLUID  Final   Culture   Final    NO GROWTH < 24 HOURS Performed at [facility], [place] [address], [place], [place] [address]    Report Status PENDING  Incomplete    Radiology Reports Dg Chest 1 View  Result Date: 07/30/2018 CLINICAL DATA:  Post thoracentesis.  Metastatic disease. EXAM: CHEST  1 VIEW COMPARISON:  Ultrasound 07/30/2018.  Chest x-ray 12/27/2017. FINDINGS: Cardiomegaly. Bilateral pulmonary mass lesions again noted. Diminished right-sided pleural effusion post thoracentesis. No evidence of pneumothorax. IMPRESSION: 1.  No evidence of pneumothorax post thoracentesis. 2.  Multiple bilateral pulmonary mass lesions again noted. Electronically Signed   By: Marcello Moores  Register   On: 07/30/2018 17:04   Dg Chest 2 View  Result Date: 08/01/2018 CLINICAL DATA:  Fever, history of metastatic bladder cancer. EXAM: CHEST - 2 VIEW COMPARISON:  Radiograph of July 30, 2018. FINDINGS: Stable cardiomediastinal silhouette. No pneumothorax is noted. Bilateral lung masses and nodules are noted consistent with metastatic disease. Largest mass appears to be in the right lung base. Right basilar atelectasis and pleural effusion is noted. Bony thorax is unremarkable. IMPRESSION: Bilateral pulmonary metastatic disease is again noted. Right basilar atelectasis and pleural effusion is noted. Electronically Signed   By: Marijo Conception, M.D.   On: 08/01/2018 12:21   Dg Chest 2 View  Result Date: 07/09/2018 CLINICAL DATA:  Metastatic bladder CA EXAM: CHEST - 2 VIEW COMPARISON:  07/21/2018 FINDINGS: Cardiac shadow is stable. Multiple too numerous to number pulmonary metastatic lesions are identified. Associated right-sided pleural effusion and right lower lobe consolidation is seen similar to that noted on  prior exam IMPRESSION: Changes consistent with known pulmonary metastatic disease. The overall appearance is similar to that seen on recent PET-CT. Electronically Signed   By: Inez Catalina M.D.   On: 08/07/2018 17:47   Nm Pet Image Restag (ps) Skull Base To Thigh  Result Date: 07/21/2018 CLINICAL DATA:  Small cell lung cancer treatment strategy for metastatic bladder cancer. EXAM: NUCLEAR MEDICINE PET SKULL BASE TO THIGH TECHNIQUE: 7.7 mCi F-18 FDG was injected intravenously. Full-ring PET imaging was performed from the skull base to thigh after the radiotracer. CT data was obtained and used for attenuation correction and anatomic localization. Fasting blood glucose: 150 mg/dl COMPARISON:  None. FINDINGS: Mediastinal blood pool activity: SUV max 2.7 NECK: No hypermetabolic lymph nodes in the neck. Incidental CT findings: none CHEST: Multiple hypermetabolic nodule pleural metastasis in the RIGHT hemithorax again demonstrated. The overall involvement of the pleural space extending into the RIGHT hilum and RIGHT subcarinal nodal station appears increased compared to prior. Example  activity subcarinal lymph nodes are increased in volume with SUV max equal 7.8 compared to SUV max equal 5.5. Two round masses in the RIGHT upper lobe have decreased metabolic activity and mild increase in size. For example RIGHT upper lobe mass measuring 3.5 cm SUV max equal 13.5 compares 3.2 cm mass with SUV max equal 21. RIGHT lower lobe mass measures 4.6 cm increased from 3.8 cm. Metabolic activity is more peripheral suggesting necrosis. RIGHT middle lobe mass measuring 4.7 cm compared to 4.4 cm. SUV max equal 13.7 compared 18.8. The pleural metastasis is slightly decreased in metabolic with activity SUV max equal 9.3 compared to SUV max equal 13. Pleural mass in the posterior RIGHT lung base measures 30 mm in thickness compared to 20 mm. Hypermetabolic nodules in the LEFT lung are similar with. For example LEFT lower lobe nodule measuring 2.1 cm compares to 2.1 cm with SUV max equal 6.9 compared to 6.3. Incidental CT findings: Choose one ABDOMEN/PELVIS: No abnormal hypermetabolic activity within the liver, pancreas, adrenal glands, or spleen. No hypermetabolic lymph nodes in the abdomen or pelvis. Incidental CT findings: none SKELETON: No focal hypermetabolic activity to suggest skeletal metastasis. Incidental CT findings: none IMPRESSION: 1. Overall evidence of progression of extensive pleural and parenchymal metastasis in the RIGHT hemithorax. 2. The large hypermetabolic RIGHT lung masses are decreased mildly in metabolic activity. However this size of lesions are increased. This finding could be seen with immunotherapy. 3. Likewise there is evidence of increase in size of pleural metastasis with increased extension into the subcarinal nodal station although again the metabolic activity is slightly decreased. Activity of these lesions me remains intense consistent with active malignancy. 4. Hypermetabolic metastatic pulmonary nodules in LEFT lung are stable. Electronically Signed   By: Suzy Bouchard M.D.    On: 07/21/2018 11:57     CBC Recent Labs  Lab 07/29/2018 0847 07/17/2018 1708 07/29/18 0510 07/30/18 0037 07/31/18 0135 08/01/18 0443  WBC 27.6* 21.1* 22.0* 21.5* 20.0* 28.1*  HGB 9.3* 12.2* 8.0* 8.7* 8.9* 7.9*  HCT 29.8* 39.4 26.0* 28.8* 28.8* 25.7*  PLT 653* 656* 765* 609* 641* 571*  MCV 83.6 86.0 85.8 87.0 86.2 86.8  MCH 26.2* 26.6 26.4 26.3 26.6 26.7  MCHC 31.3* 31.0 30.8 30.2 30.9 30.7  RDW 19.4* 19.3* 19.3* 19.3* 19.1* 18.8*  LYMPHSABS 2.3 2.5  --   --   --  2.5  MONOABS 4.6* 2.0*  --   --   --  5.9*  EOSABS 0.0 0.0  --   --   --  0.0  BASOSABS 0.1 0.1  --   --   --  0.0    Chemistries  Recent Labs  Lab 07/16/2018 0847 07/16/2018 1708 07/29/18 0510 07/31/18 0135 08/01/18 0443  NA 140 144 142 142 143  K 4.5 4.5 4.2 4.0 3.7  CL 102 101 105 106 109  CO2 24 29 28 25 25   GLUCOSE 179* 132* 109* 123* 135*  BUN 21 27* 24* 15 13  CREATININE 1.39* 1.54* 1.28* 1.15 1.11  CALCIUM 9.1 9.3 8.5* 8.3* 8.1*  MG 2.0  --  2.2  --   --   AST 61* 69* 43*  --  77*  ALT 27 33 23  --  30  ALKPHOS 125 120 92  --  119  BILITOT 0.6 0.6 0.5  --  0.9   ------------------------------------------------------------------------------------------------------------------ No results for input(s): CHOL, HDL, LDLCALC, TRIG, CHOLHDL, LDLDIRECT in the last 72 hours.  No results found for: HGBA1C ------------------------------------------------------------------------------------------------------------------ No results for input(s): TSH, T4TOTAL, T3FREE, THYROIDAB in the last 72 hours.  Invalid input(s): FREET3 ------------------------------------------------------------------------------------------------------------------ No results for input(s): VITAMINB12, FOLATE, FERRITIN, TIBC, IRON, RETICCTPCT in the last 72 hours.  Coagulation profile Recent Labs  Lab 07/17/2018 1708  INR 1.14    No results for input(s): DDIMER in the last 72 hours.  Cardiac Enzymes Recent Labs  Lab 08/06/2018 2232  07/29/18 0510 07/29/18 1100  TROPONINI <0.03 0.03* <0.03   ------------------------------------------------------------------------------------------------------------------ No results found for: BNP   Derenda Mis Aysiah Jurado M.D on 08/01/2018 at 2:05 PM  Pager [phone] Go to [email or web address] - password [email or web address] for contact info [facility] - Office  [phone]

## 2018-08-01 NOTE — Progress Notes (Signed)
ANTICOAGULATION CONSULT NOTE - Consult  Pharmacy Consult for heparin drip Indication: VTE treatment  No Known Allergies  Patient Measurements: Height: 6' (182.9 cm) Weight: 149 lb 4 oz (67.7 kg) IBW/kg (Calculated) : 77.6 Heparin Dosing Weight: TBW  Vital Signs: Temp: 102.2 F (39 C) (08/25 0406) Temp Source: Oral (08/25 0406) BP: 132/83 (08/25 0406) Pulse Rate: 118 (08/25 0406)  Labs: Recent Labs    07/29/18 1100  07/30/18 0037  07/31/18 0135 07/31/18 1001 08/01/18 0443  HGB  --   --  8.7*  --  8.9*  --   --   HCT  --   --  28.8*  --  28.8*  --   --   PLT  --   --  609*  --  641*  --   --   HEPARINUNFRC  --    < > 0.15*   < > 0.35 0.37 0.29*  CREATININE  --   --   --   --  1.15  --   --   TROPONINI <0.03  --   --   --   --   --   --    < > = values in this interval not displayed.   Estimated Creatinine Clearance: 57.2 mL/min (by C-G formula based on SCr of 1.15 mg/dL).  Medical History: Past Medical History:  Diagnosis Date  . Cancer [facility])    bladder cancer  . GERD (gastroesophageal reflux disease)   . Hypertension    Assessment: Pharmacy consulted to dose and monitor heparin infusion in this 72 year old male for VTE treatment. Patient has PMH significant for bladder cancer with lung metastases and right IJ VTE (July 2019). Patient was on enoxaparin 80 mg subcutaneously q12h PTA (last dose 08/07/2018 @ 0600) and developed right leg DVT despite being on therapeutic LMWH. Patient was converted to a heparin drip on admission in preparation for thoracentesis. Baseline aPTT 35 seconds.  Baseline labs (08/03/2018):  -Hgb 12.2 (however, Hgb was 9.1 on 07/21/18) -Plt 656 -INR 1.1 -APTT 35 seconds  8/24 HL 0.37, therapeutic on 1800 units/hr No s/s of bleed, d/w RN Hgb 8.9 (~baseline), Plts 641 Today, 8/25 0443 HL=0.29 below goal, no bleeding or infusion isssues per RN. CBC still pending  Goal of Therapy:  Heparin level 0.3-0.7 units/ml Monitor platelets by  anticoagulation protocol: Yes   Plan:   Increase heparin drip to 2000 units/hr  Recheck HL in 8 hours  Daily CBC,  daily Heparin   Monitor closely for any signs or symptoms of bleeding  F/u tx to fondaparinux per onc note   Jeremy Patrick 08/01/2018, 5:34 AM

## 2018-08-01 NOTE — Progress Notes (Signed)
ANTICOAGULATION CONSULT NOTE - Consult  Pharmacy Consult for heparin drip Indication: VTE treatment  No Known Allergies  Patient Measurements: Height: 6' (182.9 cm) Weight: 149 lb 4 oz (67.7 kg) IBW/kg (Calculated) : 77.6 Heparin Dosing Weight: TBW  Vital Signs: Temp: 98.8 F (37.1 C) (08/25 1420) Temp Source: Oral (08/25 1420) BP: 130/82 (08/25 1420) Pulse Rate: 113 (08/25 1420)  Labs: Recent Labs    07/30/18 0037  07/31/18 0135 07/31/18 1001 08/01/18 0443 08/01/18 1420  HGB 8.7*  --  8.9*  --  7.9*  --   HCT 28.8*  --  28.8*  --  25.7*  --   PLT 609*  --  641*  --  571*  --   HEPARINUNFRC 0.15*   < > 0.35 0.37 0.29* 0.34  CREATININE  --   --  1.15  --  1.11  --    < > = values in this interval not displayed.   Estimated Creatinine Clearance: 59.3 mL/min (by C-G formula based on SCr of 1.11 mg/dL).  Medical History: Past Medical History:  Diagnosis Date  . Cancer [facility])    bladder cancer  . GERD (gastroesophageal reflux disease)   . Hypertension    Assessment: Pharmacy consulted to dose and monitor heparin infusion in this 72 year old male for VTE treatment. Patient has PMH significant for bladder cancer with lung metastases and right IJ VTE (July 2019). Patient was on enoxaparin 80 mg subcutaneously q12h PTA (last dose 07/11/2018 @ 0600) and developed right leg DVT despite being on therapeutic LMWH. Patient was converted to a heparin drip on admission in preparation for thoracentesis. Baseline aPTT 35 seconds.  Baseline labs (07/26/2018):  -Hgb 12.2 (however, Hgb was 9.1 on 07/21/18) -Plt 656 -INR 1.1 -APTT 35 seconds  08/01/2018 HL 0.34, therapeutic on 2000 units/hr Hgb 7.9 No bleeding or other issues noted  Goal of Therapy:  Heparin level 0.3-0.7 units/ml Monitor platelets by anticoagulation protocol: Yes   Plan:   continue heparin drip at 2000 units/hr  Daily CBC,  daily Heparin   Monitor closely for any signs or symptoms of bleeding  F/u tx to  fondaparinux per onc note   Dolly Rias RPh 08/01/2018, 5:45 PM Pager [phone]

## 2018-08-01 NOTE — Evaluation (Signed)
Physical Therapy Evaluation Patient Details Name: Jeremy Patrick MRN: [id number] DOB: 28-Nov-1946 Today's Date: 08/01/2018   History of Present Illness  Pt is a 72 y.o. male with medical history significant of metastatic bladder cancer, HTN,  and right IJ DVT (Dxed 06/23/18) on Lovenox. He developed bilateral lower extremity DVT despite being compliant with Lovenox and was admitted on 08/05/2018.     Clinical Impression  Pt admitted with above diagnosis. Pt currently with functional limitations due to the deficits listed below (see PT Problem List). PTA pt lived at home with wife in 2nd floor apt. Pt/wife reports steady decline in strength and mobility. On eval, he required min assist bed mobility, min assist transfers and min assist ambulation 15 feet x 2 with RW. He demonstrates deficits in balance and activity tolerance. He has 2 flights of stairs with bilat rails to enter his apartment.  Pt will benefit from skilled PT to increase their independence and safety with mobility to allow discharge to the venue listed below.  Recommending rollator for home.     Follow Up Recommendations Home health PT;Supervision/Assistance - 24 hour    Equipment Recommendations  Other (comment)(rollator)    Recommendations for Other Services       Precautions / Restrictions Precautions Precautions: Fall      Mobility  Bed Mobility Overal bed mobility: Needs Assistance Bed Mobility: Supine to Sit;Sit to Supine     Supine to sit: Min assist;HOB elevated Sit to supine: Min guard;HOB elevated   General bed mobility comments: cues for sequencing, assist to elevate trunk, +rail  Transfers Overall transfer level: Needs assistance Equipment used: Rolling walker (2 wheeled) Transfers: Sit to/from [facility] Sit to Stand: Min assist Stand pivot transfers: Min assist       General transfer comment: cues for hand placement, assist to power up, increased time to stabilize initial  standing balance  Ambulation/Gait Ambulation/Gait assistance: Min assist Gait Distance (Feet): 15 Feet(x 2) Assistive device: Rolling walker (2 wheeled) Gait Pattern/deviations: Step-through pattern;Decreased stride length Gait velocity: decreased Gait velocity interpretation: <1.31 ft/sec, indicative of household ambulator General Gait Details: unsteady gait, pt fatigues quickly  Stairs            Wheelchair Mobility    Modified Rankin (Stroke Patients Only)       Balance Overall balance assessment: Needs assistance Sitting-balance support: No upper extremity supported;Feet supported Sitting balance-Leahy Scale: Fair     Standing balance support: Bilateral upper extremity supported;During functional activity Standing balance-Leahy Scale: Fair Standing balance comment: reliant on RW for dynamic balance                             Pertinent Vitals/Pain Pain Assessment: No/denies pain    Home Living Family/patient expects to be discharged to:: Private residence Living Arrangements: Spouse/significant other Available Help at Discharge: Family;Available 24 hours/day Type of Home: Apartment Home Access: Stairs to enter Entrance Stairs-Rails: Right;Left;Can reach both Entrance Stairs-Number of Steps: 2 flights Home Layout: One level Home Equipment: Shower seat;Bedside commode;Toilet riser      Prior Function Level of Independence: Independent               Hand Dominance        Extremity/Trunk Assessment   Upper Extremity Assessment Upper Extremity Assessment: Generalized weakness    Lower Extremity Assessment Lower Extremity Assessment: Generalized weakness    Cervical / Trunk Assessment Cervical / Trunk Assessment: Kyphotic  Communication  Communication: HOH(hears best in his left ear)  Cognition Arousal/Alertness: Awake/alert Behavior During Therapy: Flat affect Overall Cognitive Status: Within Functional Limits for tasks  assessed                                        General Comments      Exercises     Assessment/Plan    PT Assessment Patient needs continued PT services  PT Problem List Decreased strength;Decreased mobility;Decreased activity tolerance;Decreased balance;Decreased knowledge of use of DME       PT Treatment Interventions DME instruction;Therapeutic activities;Gait training;Therapeutic exercise;Patient/family education;Stair training;Balance training;Functional mobility training    PT Goals (Current goals can be found in the Care Plan section)  Acute Rehab PT Goals Patient Stated Goal: home per wife PT Goal Formulation: With patient/family Time For Goal Achievement: 08/15/18 Potential to Achieve Goals: Fair    Frequency Min 3X/week   Barriers to discharge        Co-evaluation               AM-PAC PT "6 Clicks" Daily Activity  Outcome Measure Difficulty turning over in bed (including adjusting bedclothes, sheets and blankets)?: A Lot Difficulty moving from lying on back to sitting on the side of the bed? : Unable Difficulty sitting down on and standing up from a chair with arms (e.g., wheelchair, bedside commode, etc,.)?: Unable Help needed moving to and from a bed to chair (including a wheelchair)?: A Little Help needed walking in hospital room?: A Little Help needed climbing 3-5 steps with a railing? : A Lot 6 Click Score: 12    End of Session Equipment Utilized During Treatment: Gait belt Activity Tolerance: Patient limited by fatigue Patient left: in bed;with call bell/phone within reach;with family/visitor present Nurse Communication: Mobility status PT Visit Diagnosis: Difficulty in walking, not elsewhere classified (R26.2);Muscle weakness (generalized) (M62.81)    Time: 0712-1975 PT Time Calculation (min) (ACUTE ONLY): 23 min   Charges:   PT Evaluation $PT Eval Moderate Complexity: 1 Mod PT Treatments $Therapeutic Activity: 8-22  mins        Lorrin Goodell, PT  Office # [phone] Pager [phone]   Lorriane Shire 08/01/2018, 12:22 PM

## 2018-08-02 DIAGNOSIS — A419 Sepsis, unspecified organism: Secondary | ICD-10-CM

## 2018-08-02 DIAGNOSIS — N390 Urinary tract infection, site not specified: Secondary | ICD-10-CM

## 2018-08-02 LAB — HEPARIN LEVEL (UNFRACTIONATED): Heparin Unfractionated: 0.42 IU/mL (ref 0.30–0.70)

## 2018-08-02 LAB — BASIC METABOLIC PANEL
Anion gap: 11 (ref 5–15)
BUN: 15 mg/dL (ref 8–23)
CO2: 21 mmol/L — ABNORMAL LOW (ref 22–32)
Calcium: 8.3 mg/dL — ABNORMAL LOW (ref 8.9–10.3)
Chloride: 107 mmol/L (ref 98–111)
Creatinine, Ser: 1.24 mg/dL (ref 0.61–1.24)
GFR calc Af Amer: >60 mL/min (ref >60)
GFR calc non Af Amer: 57 mL/min — ABNORMAL LOW (ref >60)
Glucose, Bld: 121 mg/dL — ABNORMAL HIGH (ref 70–99)
Potassium: 4.2 mmol/L (ref 3.5–5.1)
Sodium: 139 mmol/L (ref 135–145)

## 2018-08-02 LAB — CBC
HCT: 30.4 % — ABNORMAL LOW (ref 39.0–52.0)
Hemoglobin: 9.1 g/dL — ABNORMAL LOW (ref 13.0–17.0)
MCH: 26.5 pg (ref 26.0–34.0)
MCHC: 29.9 g/dL — ABNORMAL LOW (ref 30.0–36.0)
MCV: 88.6 fL (ref 78.0–100.0)
Platelets: 544 10*3/uL — ABNORMAL HIGH (ref 150–400)
RBC: 3.43 MIL/uL — ABNORMAL LOW (ref 4.22–5.81)
RDW: 19.6 % — ABNORMAL HIGH (ref 11.5–15.5)
WBC: 36.3 10*3/uL — ABNORMAL HIGH (ref 4.0–10.5)

## 2018-08-02 MED ORDER — SULFAMETHOXAZOLE-TRIMETHOPRIM 800-160 MG PO TABS
1.0000 | ORAL_TABLET | Freq: Two times a day (BID) | ORAL | Status: DC
  Administered 2018-08-02 (×2): 1 via ORAL
  Filled 2018-08-02 (×2): qty 1

## 2018-08-02 MED ORDER — FAMOTIDINE 20 MG PO TABS
20.0000 mg | ORAL_TABLET | Freq: Two times a day (BID) | ORAL | Status: DC
  Administered 2018-08-02 – 2018-08-06 (×9): 20 mg via ORAL
  Filled 2018-08-02 (×9): qty 1

## 2018-08-02 MED ORDER — ALUM & MAG HYDROXIDE-SIMETH 200-200-20 MG/5ML PO SUSP
30.0000 mL | ORAL | Status: DC | PRN
  Administered 2018-08-02 – 2018-08-05 (×2): 30 mL via ORAL
  Filled 2018-08-02 (×2): qty 30

## 2018-08-02 MED ORDER — HEPARIN BOLUS VIA INFUSION
3500.0000 [IU] | Freq: Once | INTRAVENOUS | Status: AC
  Administered 2018-08-02: 3500 [IU] via INTRAVENOUS
  Filled 2018-08-02: qty 3500

## 2018-08-02 NOTE — Progress Notes (Signed)
Physical Therapy Treatment Patient Details Name: Jeremy Patrick MRN: [id number] DOB: 1946/09/11 Today's Date: 08/02/2018    History of Present Illness Pt is a 72 y.o. male with medical history significant of metastatic bladder cancer, HTN,  and right IJ DVT (Dxed 06/23/18) on Lovenox. He developed bilateral lower extremity DVT despite being compliant with Lovenox and was admitted on 07/09/2018.     PT Comments    Pt assisted with ambulating short distance and requires min assist for steadying.  Current plan is HHPT however strongly recommend 24/7 assist upon d/c as pt is high fall risk.  No family present at time of session however if spouse is unable to provide physical assist, recommend SNF. Pt also reports he is not very active at baseline, mostly stays in bed.    Follow Up Recommendations  Home health PT;Supervision/Assistance - 24 hour     Equipment Recommendations  Rolling walker with 5" wheels    Recommendations for Other Services       Precautions / Restrictions Precautions Precautions: Fall    Mobility  Bed Mobility Overal bed mobility: Needs Assistance Bed Mobility: Supine to Sit     Supine to sit: Supervision;HOB elevated     General bed mobility comments: increased time and effort  Transfers Overall transfer level: Needs assistance Equipment used: Rolling walker (2 wheeled) Transfers: Sit to/from Stand Sit to Stand: Min assist;From elevated surface         General transfer comment: verbal cues for safe technique, assist to rise and steady  Ambulation/Gait Ambulation/Gait assistance: Min assist Gait Distance (Feet): 28 Feet Assistive device: Rolling walker (2 wheeled) Gait Pattern/deviations: Step-through pattern;Decreased stride length     General Gait Details: very unsteady gait requiring min assist for balance, fatigues quickly   Stairs             Wheelchair Mobility    Modified Rankin (Stroke Patients Only)       Balance           Standing balance support: Bilateral upper extremity supported;During functional activity Standing balance-Leahy Scale: Poor                              Cognition Arousal/Alertness: Awake/alert Behavior During Therapy: Flat affect Overall Cognitive Status: Within Functional Limits for tasks assessed                                        Exercises      General Comments        Pertinent Vitals/Pain Pain Assessment: No/denies pain    Home Living                      Prior Function            PT Goals (current goals can now be found in the care plan section) Progress towards PT goals: Progressing toward goals    Frequency    Min 3X/week      PT Plan Current plan remains appropriate    Co-evaluation              AM-PAC PT "6 Clicks" Daily Activity  Outcome Measure  Difficulty turning over in bed (including adjusting bedclothes, sheets and blankets)?: A Lot Difficulty moving from lying on back to sitting on the side of the bed? : A Lot Difficulty  sitting down on and standing up from a chair with arms (e.g., wheelchair, bedside commode, etc,.)?: Unable Help needed moving to and from a bed to chair (including a wheelchair)?: A Little Help needed walking in hospital room?: A Little Help needed climbing 3-5 steps with a railing? : A Lot 6 Click Score: 13    End of Session Equipment Utilized During Treatment: Gait belt Activity Tolerance: Patient limited by fatigue Patient left: with call bell/phone within reach;in chair;with chair alarm set Nurse Communication: Mobility status PT Visit Diagnosis: Difficulty in walking, not elsewhere classified (R26.2);Muscle weakness (generalized) (M62.81)     Time: 2297-9892 PT Time Calculation (min) (ACUTE ONLY): 17 min  Charges:  $Gait Training: 8-22 mins                     Carmelia Bake, PT, DPT 08/02/2018 Pager: [phone] York Ram E 08/02/2018, 12:34 PM

## 2018-08-02 NOTE — Progress Notes (Signed)
PROGRESS NOTE [facility]   JAYSE HODKINSON   XAJ:[id number] DOB: 1946/07/20  DOA: 07/27/2018 PCP: [place], Betty G, MD   Brief Narrative:  Jeremy Patrick is a 72 y.o.malewith medical history significant of metastatic bladder cancer, HTN, Right IJ DVT (Dxed 06/23/18) on Lovenox, sadly has developed bilateral lower extremity DVT despite being compliant with Lovenox admitted on 08/07/2018.  During hospital stay patient was febrile  Subjective: Patient seen and examined, complaining of epigastric/mid chest pain, burning sensation been going on for about 2 weeks now.  Afebrile overnight, tolerating diet well.  WBC increased.  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  Despite compliance with Lovenox patient still having acute DVTs.  Patient been treated with IV heparin, oncology was consulted and recommended to discharge Arixtra.  Echocardiogram shows 65 to 70% with mild pulmonary hypertension.  No hypoxia, doubt PE.  Will obtain CTA chest as treatment will not change.  Discontinue Megace with medication increase risk for blood clots.  Sepsis secondary to UTI Urine culture showing Enterobacter aerogenes, patient was started on vancomycin and cefepime on 8/25.  Fever have defervescence, will switch to Bactrim per in-house antibiogram 99% rate of cure.  Sensitivities pending, will continue to monitor.  Monitor renal function closely.  Sepsis physiology improved.  Check WBC in a.m.  Pleural effusion Suspect malignancy S/p Ultrasound-guided thoracentesis with 1 L removed.  No growth on fluid culture.  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 Irene Limbo and outpatient.  Poor prognosis  Severe protein caloric malnutrition due to chronic disease Continue nutritional supplements  and Remeron to help with appetite.  DC Megace as this can increase risk of blood clots.  GERD Start Pepcid 20 mg twice daily  DVT prophylaxis: IV heparin Code Status: Full code Family Communication: None at bedside Disposition Plan: Home with home health when afebrile and improving WBC.  Consultants:   None  Procedures:     Antimicrobials: Anti-infectives (From admission, onward)   Start     Dose/Rate Route Frequency Ordered Stop   08/02/18 1500  vancomycin (VANCOCIN) IVPB 1000 mg/200 mL premix  Status:  Discontinued     1,000 mg 200 mL/hr over 60 Minutes Intravenous Every 24 hours 08/01/18 1431 08/02/18 1444   08/02/18 1500  sulfamethoxazole-trimethoprim (BACTRIM DS,SEPTRA DS) 800-160 MG per tablet 1 tablet     1 tablet Oral Every 12 hours 08/02/18 1444     08/01/18 1445  vancomycin (VANCOCIN) 1,500 mg in sodium chloride 0.9 % 500 mL IVPB     1,500 mg 250 mL/hr over 120 Minutes Intravenous  Once 08/01/18 1431 08/01/18 1840   08/01/18 1430  ceFEPIme (MAXIPIME) 2 g in sodium chloride 0.9 % 100 mL IVPB  Status:  Discontinued     2 g 200 mL/hr over 30 Minutes Intravenous Every 12 hours 08/01/18 1410 08/02/18 1444   08/01/18 1415  vancomycin (VANCOCIN) IVPB 1000 mg/200 mL premix  Status:  Discontinued     1,000 mg 200 mL/hr over 60 Minutes Intravenous  Once 08/01/18 1412 08/01/18 1430        Objective: Vitals:   08/02/18 0419 08/02/18 0421 08/02/18 0430 08/02/18 1417  BP: (!) 80/61 (!) 143/76 101/67 100/63  Pulse: 99 (!) 105 100 (!) 114  Resp: 16   18  Temp: (!) 97.3 F (36.3 C)  98.1 F (36.7 C)  TempSrc: Oral   Oral  SpO2: 95%   91%  Weight:      Height:        Intake/Output Summary (Last 24 hours) at 08/02/2018 1435 Last data filed at 08/02/2018 0900 Gross per 24 hour  Intake 2622.88 ml  Output -  Net 2622.88 ml   Filed Weights   08/03/2018 1707  Weight: 67.7 kg    Examination:  General exam: Appears calm and comfortable  HEENT: OP moist and  clear Respiratory system: Diminished bilaterally R> L, no wheezing Cardiovascular system: S1 & S2 heard, RRR. No JVD, murmurs, rubs or gallops Gastrointestinal system: Abdomen is nondistended, soft and nontender.  Central nervous system: Alert and oriented. Extremities: No pedal edema.   Skin: No rashes, lesions or ulcers Psychiatry: Mood & affect appropriate.    Data Reviewed: I have personally reviewed following labs and imaging studies  CBC: Recent Labs  Lab 07/26/2018 0847 07/25/2018 1708 07/29/18 0510 07/30/18 0037 07/31/18 0135 08/01/18 0443 08/02/18 0441  WBC 27.6* 21.1* 22.0* 21.5* 20.0* 28.1* 36.3*  NEUTROABS 20.6* 16.5*  --   --   --  19.7*  --   HGB 9.3* 12.2* 8.0* 8.7* 8.9* 7.9* 9.1*  HCT 29.8* 39.4 26.0* 28.8* 28.8* 25.7* 30.4*  MCV 83.6 86.0 85.8 87.0 86.2 86.8 88.6  PLT 653* 656* 765* 609* 641* 571* 496*   Basic Metabolic Panel: Recent Labs  Lab 08/02/2018 0847 07/26/2018 1708 07/29/18 0510 07/31/18 0135 08/01/18 0443 08/02/18 0441  NA 140 144 142 142 143 139  K 4.5 4.5 4.2 4.0 3.7 4.2  CL 102 101 105 106 109 107  CO2 24 29 28 25 25  21*  GLUCOSE 179* 132* 109* 123* 135* 121*  BUN 21 27* 24* 15 13 15   CREATININE 1.39* 1.54* 1.28* 1.15 1.11 1.24  CALCIUM 9.1 9.3 8.5* 8.3* 8.1* 8.3*  MG 2.0  --  2.2  --   --   --   PHOS  --   --  2.6  --   --   --    GFR: Estimated Creatinine Clearance: 53.1 mL/min (by C-G formula based on SCr of 1.24 mg/dL). Liver Function Tests: Recent Labs  Lab 07/24/2018 0847 07/09/2018 1708 07/29/18 0510 07/30/18 1717 08/01/18 0443  AST 61* 69* 43*  --  77*  ALT 27 33 23  --  30  ALKPHOS 125 120 92  --  119  BILITOT 0.6 0.6 0.5  --  0.9  PROT 7.2 7.9 6.3* 6.3* 5.9*  ALBUMIN 2.3* 2.9* 2.3*  --  2.0*   No results for input(s): LIPASE, AMYLASE in the last 168 hours. No results for input(s): AMMONIA in the last 168 hours. Coagulation Profile: Recent Labs  Lab 08/06/2018 1708  INR 1.14   Cardiac Enzymes: Recent Labs  Lab  07/27/2018 1708 08/01/2018 2232 07/29/18 0510 07/29/18 1100  TROPONINI 0.03* <0.03 0.03* <0.03   BNP (last 3 results) No results for input(s): PROBNP in the last 8760 hours. HbA1C: No results for input(s): HGBA1C in the last 72 hours. CBG: Recent Labs  Lab 07/30/18 1806  GLUCAP 113*   Lipid Profile: No results for input(s): CHOL, HDL, LDLCALC, TRIG, CHOLHDL, LDLDIRECT in the last 72 hours. Thyroid Function Tests: No results for input(s): TSH, T4TOTAL, FREET4, T3FREE, THYROIDAB in the last 72 hours. Anemia Panel: No results for input(s): VITAMINB12, FOLATE, FERRITIN, TIBC, IRON, RETICCTPCT in the last 72 hours. Sepsis Labs: No results for input(s): PROCALCITON, LATICACIDVEN in  the last 168 hours.  Recent Results (from the past 240 hour(s))  Culture, body fluid-bottle     Status: None (Preliminary result)   Collection Time: 07/30/18  4:57 PM  Result Value Ref Range Status   Specimen Description PLEURAL  Final   Special Requests FLUID  Final   Culture   Final    NO GROWTH 3 DAYS Performed at [facility], [address], [place], [place] [address]    Report Status PENDING  Incomplete  Culture, blood (Routine X 2) w Reflex to ID Panel     Status: None (Preliminary result)   Collection Time: 07/31/18  6:38 PM  Result Value Ref Range Status   Specimen Description   Final    BLOOD LEFT ANTECUBITAL Performed at [facility] [address], [place], [place] [address]    Special Requests   Final    BOTTLES DRAWN AEROBIC ONLY Blood Culture adequate volume Performed at [facility] [address] [place], [place] [address]    Culture   Final    NO GROWTH 2 DAYS Performed at [facility] [address], [place], [place] [address]    Report Status PENDING  Incomplete  Culture, blood (Routine X 2) w Reflex to ID Panel     Status: None (Preliminary result)   Collection Time: 07/31/18  6:38 PM  Result Value Ref Range Status    Specimen Description   Final    BLOOD BLOOD LEFT HAND Performed at [facility] [address] [place], [place] [address]    Special Requests   Final    BOTTLES DRAWN AEROBIC ONLY Blood Culture adequate volume Performed at [facility] [address] [place], [place] [address]    Culture   Final    NO GROWTH 2 DAYS Performed at [facility] [address], [place], [place] [address]    Report Status PENDING  Incomplete  Urine Culture     Status: Abnormal (Preliminary result)   Collection Time: 07/31/18  7:27 PM  Result Value Ref Range Status   Specimen Description   Final    URINE, CATHETERIZED Performed at [facility] [address], [place], [place] [address]    Special Requests   Final    Immunocompromised Performed at [facility], [place] [address] [place],  [address]    Culture >=100,000 COLONIES/mL ENTEROBACTER AEROGENES (A)  Final   Report Status PENDING  Incomplete      Radiology Studies: Dg Chest 2 View  Result Date: 08/01/2018 CLINICAL DATA:  Fever, history of metastatic bladder cancer. EXAM: CHEST - 2 VIEW COMPARISON:  Radiograph of July 30, 2018. FINDINGS: Stable cardiomediastinal silhouette. No pneumothorax is noted. Bilateral lung masses and nodules are noted consistent with metastatic disease. Largest mass appears to be in the right lung base. Right basilar atelectasis and pleural effusion is noted. Bony thorax is unremarkable. IMPRESSION: Bilateral pulmonary metastatic disease is again noted. Right basilar atelectasis and pleural effusion is noted. Electronically Signed   By: Marijo Conception, M.D.   On: 08/01/2018 12:21      Scheduled Meds: . famotidine  20 mg Oral BID  . lactose free nutrition  237 mL Oral TID WC  . mirtazapine  15 mg Oral QHS  . multivitamin with minerals  1 tablet Oral Daily  . senna-docusate  2 tablet Oral QHS   Continuous Infusions: .  ceFEPime (MAXIPIME)  IV 2 g (08/02/18 0247)  . dextrose 5 % and 0.45% NaCl 40 mL/hr at 08/01/18 1856  . heparin 2,000 Units/hr (08/02/18 0246)  . vancomycin       LOS: 5 days    Time spent: Total of 25 minutes spent with pt, greater than 50% of which was spent in discussion of  treatment, counseling and coordination of care   Chipper Oman, MD Pager: Text Page via [email or web address]   If 7PM-7AM, please contact night-coverage [email or web address] 08/02/2018, 2:35 PM   Note - This record has been created using [employer]. Chart creation errors have been sought, but may not always have been located. Such creation errors do not reflect on the standard of medical care.

## 2018-08-02 NOTE — Care Management Important Message (Signed)
Important Message  Patient Details  Name: NEWT LEVINGSTON MRN: [id number] Date of Birth: [birth year]   Medicare Important Message Given:  Yes    Kerin Salen 08/02/2018, 12:47 [place] Message  Patient Details  Name: KEYDEN PAVLOV MRN: [id number] Date of Birth: July 13, 1946   Medicare Important Message Given:  Yes    Kerin Salen 08/02/2018, 12:47 PM

## 2018-08-02 NOTE — Progress Notes (Signed)
PHARMACY - HEPARIN (brief note)  Due to loss of IV access, IV heparin was stopped @ 12:00 today.  Notified by RN that IV access is now available.  PLAN: Give heparin 3500 unit IV bolus x 1 then resume heparin infusion @ 2000 units/hr (previous rate)  Will check heparin level 8 hr after heparin restarted  Leone Haven, PharmD 08/02/18 @ 17:25

## 2018-08-02 NOTE — Progress Notes (Addendum)
Nutrition Follow-up  DOCUMENTATION CODES:   Severe malnutrition in context of chronic illness  INTERVENTION:    Boost Plus chocolate TID- Each supplement provides 360kcal and 14g protein.    Magic cup TID with meals, each supplement provides 290 kcal and 9 grams of protein  NUTRITION DIAGNOSIS:   Severe Malnutrition related to chronic illness, cancer and cancer related treatments as evidenced by energy intake < or equal to 75% for > or equal to 1 month, percent weight loss, moderate fat depletion, severe fat depletion, moderate muscle depletion, severe muscle depletion.  Ongoing  GOAL:   Patient will meet greater than or equal to 90% of their needs  Not meeting  MONITOR:   PO intake, Supplement acceptance, Weight trends, Labs  REASON FOR ASSESSMENT:   Consult Malnutrition Eval  ASSESSMENT:   Patient with PMH significant for metastatic breast cancer, HTN, and right IJ DVT. Admitted for bilateral DVT despite being on full dose anticoagulation elevated troponin cannot rule out PE at this time.    Pt is unable to answer RD questions at bedside. Intake remains minimal per tech reports. Meal completions charted as 0-20% for his las three meals. He took a couple bites of breakfast this morning, and refused the rest. He refuses Magic Cup on tray. RN to attempt Boost this afternoon. Will continue to encourage PO intake. If intake continue to decline recommend initiation of nutrition support if it's within goals of care.   A recent wt has not been obtained since last RD visit. Will attempt to obtain new weight.  Pt used to take marinol at home which worked very well, but he was unable to afford it.  Per pharmacy megace is contraindicated in setting of recurring VTE. Pt remains on Remeron.   Medications reviewed and include: megace BID, Remeron Labs reviewed.   Diet Order:   Diet Order            Diet regular Room service appropriate? Yes; Fluid consistency: Thin  Diet  effective now              EDUCATION NEEDS:   Education needs have been addressed  Skin:  Skin Assessment: Reviewed RN Assessment  Last BM:  07/30/18  Height:   Ht Readings from Last 1 Encounters:  07/20/2018 6' (1.829 m)    Weight:   Wt Readings from Last 1 Encounters:  07/25/2018 67.7 kg    Ideal Body Weight:  80.9 kg  BMI:  Body mass index is 20.24 kg/m.  Estimated Nutritional Needs:   Kcal:  4580-9983 kcal  Protein:  115-130 grams   Fluid:  >/= 2.3 L/day  Mariana Single RD, LDN Clinical Nutrition Pager # - [phone]

## 2018-08-02 NOTE — Progress Notes (Signed)
Pt has a MEWS score of 5  Currently due to fever which is driving up resp and heart rate. Norco given for pain and fever. Will recheck VS at 0100 and reassess situation at that point. Hoyle Barr, RN-C

## 2018-08-02 NOTE — Plan of Care (Signed)
Pt not taking in very much nutrition PO.  Pt not C/O pain.

## 2018-08-02 NOTE — Progress Notes (Signed)
ANTICOAGULATION CONSULT NOTE - Consult  Pharmacy Consult for heparin drip Indication: VTE treatment  No Known Allergies  Patient Measurements: Height: 6' (182.9 cm) Weight: 149 lb 4 oz (67.7 kg) IBW/kg (Calculated) : 77.6 Heparin Dosing Weight: TBW  Vital Signs: Temp: 97.3 F (36.3 C) (08/26 0419) Temp Source: Oral (08/26 0419) BP: 101/67 (08/26 0430) Pulse Rate: 100 (08/26 0430)  Labs: Recent Labs    07/31/18 0135  08/01/18 0443 08/01/18 1420 08/02/18 0441  HGB 8.9*  --  7.9*  --  9.1*  HCT 28.8*  --  25.7*  --  30.4*  PLT 641*  --  571*  --  544*  HEPARINUNFRC 0.35   < > 0.29* 0.34 0.42  CREATININE 1.15  --  1.11  --  1.24   < > = values in this interval not displayed.   Estimated Creatinine Clearance: 53.1 mL/min (by C-G formula based on SCr of 1.24 mg/dL).  Medical History: Past Medical History:  Diagnosis Date  . Cancer [facility])    bladder cancer  . GERD (gastroesophageal reflux disease)   . Hypertension    Assessment: Pharmacy consulted to dose and monitor heparin infusion in this 72 year old male for VTE treatment. Patient has PMH significant for bladder cancer with lung metastases and right IJ VTE (July 2019). Patient was on enoxaparin 80 mg subcutaneously q12h PTA (last dose 07/24/2018 @ 0600) and developed right leg DVT despite being on therapeutic LMWH. Patient was converted to a heparin drip on admission in preparation for thoracentesis. Baseline aPTT 35 seconds.  Baseline labs (07/20/2018):  -Hgb 12.2 (however, Hgb was 9.1 on 07/21/18) -Plt 656 -INR 1.1 -APTT 35 seconds  08/02/2018 HL 0.42, therapeutic on 2000 units/hr Hgb 7.9>>9.1, PLTC 544 No bleeding or other issues noted  Goal of Therapy:  Heparin level 0.3-0.7 units/ml Monitor platelets by anticoagulation protocol: Yes   Plan:   continue heparin drip at 2000 units/hr  Daily CBC,  daily Heparin   Monitor closely for any signs or symptoms of bleeding  F/u tx to fondaparinux per onc  note  rec stopping Megace in setting of recurrent VTE  Eudelia Bunch, Pharm.D [phone] 08/02/2018 10:36 AM

## 2018-08-03 ENCOUNTER — Inpatient Hospital Stay (HOSPITAL_COMMUNITY): Payer: Medicare Other

## 2018-08-03 LAB — CBC
HCT: 27.1 % — ABNORMAL LOW (ref 39.0–52.0)
Hemoglobin: 8.6 g/dL — ABNORMAL LOW (ref 13.0–17.0)
MCH: 27.2 pg (ref 26.0–34.0)
MCHC: 31.7 g/dL (ref 30.0–36.0)
MCV: 85.8 fL (ref 78.0–100.0)
Platelets: 534 10*3/uL — ABNORMAL HIGH (ref 150–400)
RBC: 3.16 MIL/uL — ABNORMAL LOW (ref 4.22–5.81)
RDW: 18.8 % — ABNORMAL HIGH (ref 11.5–15.5)
WBC: 40.5 10*3/uL — ABNORMAL HIGH (ref 4.0–10.5)

## 2018-08-03 LAB — HEPARIN LEVEL (UNFRACTIONATED)
Heparin Unfractionated: 0.29 IU/mL — ABNORMAL LOW (ref 0.30–0.70)
Heparin Unfractionated: <0.1 IU/mL — ABNORMAL LOW (ref 0.30–0.70)
Heparin Unfractionated: 0.43 IU/mL (ref 0.30–0.70)

## 2018-08-03 LAB — BASIC METABOLIC PANEL
Anion gap: 10 (ref 5–15)
BUN: 22 mg/dL (ref 8–23)
CO2: 22 mmol/L (ref 22–32)
Calcium: 8.1 mg/dL — ABNORMAL LOW (ref 8.9–10.3)
Chloride: 107 mmol/L (ref 98–111)
Creatinine, Ser: 1.62 mg/dL — ABNORMAL HIGH (ref 0.61–1.24)
GFR calc Af Amer: 48 mL/min — ABNORMAL LOW (ref >60)
GFR calc non Af Amer: 41 mL/min — ABNORMAL LOW (ref >60)
Glucose, Bld: 111 mg/dL — ABNORMAL HIGH (ref 70–99)
Potassium: 4.7 mmol/L (ref 3.5–5.1)
Sodium: 139 mmol/L (ref 135–145)

## 2018-08-03 LAB — CBC WITH DIFFERENTIAL/PLATELET
Basophils Absolute: 0 10*3/uL (ref 0.0–0.1)
Basophils Relative: 0 %
Eosinophils Absolute: 0 10*3/uL (ref 0.0–0.7)
Eosinophils Relative: 0 %
HCT: 25.1 % — ABNORMAL LOW (ref 39.0–52.0)
Hemoglobin: 7.8 g/dL — ABNORMAL LOW (ref 13.0–17.0)
Lymphocytes Relative: 5 %
Lymphs Abs: 1.7 10*3/uL (ref 0.7–4.0)
MCH: 26.4 pg (ref 26.0–34.0)
MCHC: 31.1 g/dL (ref 30.0–36.0)
MCV: 85.1 fL (ref 78.0–100.0)
Monocytes Absolute: 5 10*3/uL — ABNORMAL HIGH (ref 0.1–1.0)
Monocytes Relative: 15 %
Neutro Abs: 26.9 10*3/uL — ABNORMAL HIGH (ref 1.7–7.7)
Neutrophils Relative %: 80 %
Platelets: 485 10*3/uL — ABNORMAL HIGH (ref 150–400)
RBC: 2.95 MIL/uL — ABNORMAL LOW (ref 4.22–5.81)
RDW: 18.9 % — ABNORMAL HIGH (ref 11.5–15.5)
WBC: 33.6 10*3/uL — ABNORMAL HIGH (ref 4.0–10.5)

## 2018-08-03 LAB — RETICULOCYTES
RBC.: 3.16 MIL/uL — ABNORMAL LOW (ref 4.22–5.81)
Retic Count, Absolute: 37.9 10*3/uL (ref 19.0–186.0)
Retic Ct Pct: 1.2 % (ref 0.4–3.1)

## 2018-08-03 LAB — URINE CULTURE: Culture: >=100000 — AB

## 2018-08-03 LAB — FERRITIN: Ferritin: 2655 ng/mL — ABNORMAL HIGH (ref 24–336)

## 2018-08-03 LAB — IRON AND TIBC
Iron: 17 ug/dL — ABNORMAL LOW (ref 45–182)
Saturation Ratios: 15 % — ABNORMAL LOW (ref 17.9–39.5)
TIBC: 114 ug/dL — ABNORMAL LOW (ref 250–450)
UIBC: 97 ug/dL

## 2018-08-03 LAB — FOLATE: Folate: 6.2 ng/mL (ref >5.9)

## 2018-08-03 LAB — VITAMIN B12: Vitamin B-12: 872 pg/mL (ref 180–914)

## 2018-08-03 MED ORDER — SODIUM CHLORIDE 0.9 % IV BOLUS
500.0000 mL | Freq: Once | INTRAVENOUS | Status: AC
  Administered 2018-08-03: 500 mL via INTRAVENOUS

## 2018-08-03 MED ORDER — CEFDINIR 300 MG PO CAPS
300.0000 mg | ORAL_CAPSULE | Freq: Two times a day (BID) | ORAL | Status: DC
  Administered 2018-08-03 – 2018-08-06 (×7): 300 mg via ORAL
  Filled 2018-08-03 (×9): qty 1

## 2018-08-03 MED ORDER — HEPARIN BOLUS VIA INFUSION
4000.0000 [IU] | Freq: Once | INTRAVENOUS | Status: AC
  Administered 2018-08-03: 4000 [IU] via INTRAVENOUS
  Filled 2018-08-03: qty 4000

## 2018-08-03 NOTE — Progress Notes (Signed)
PROGRESS NOTE [facility]   Jeremy Patrick   VQM:[id number] DOB: 1946/11/12  DOA: 08/07/2018 PCP: [place], Betty G, MD   Brief Narrative:  Jeremy Patrick is a 72 y.o.malewith medical history significant of metastatic bladder cancer, HTN, Right IJ DVT (Dxed 06/23/18) on Lovenox, sadly has developed bilateral lower extremity DVT despite being compliant with Lovenox admitted on 07/27/2018.  Patient was started on heparin drip.  Oncology was consulted and recommended Arixtra upon discharge.  During hospital stay patient has been spiking fevers and white count continues to follow-up.  Subjective: Patient has no complaints today, feeling somewhat better.  WBC continues to increase.  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  Despite compliance with Lovenox patient still having acute DVTs.  Patient been treated with IV heparin, oncology was consulted and recommended to discharge Arixtra.  Echocardiogram shows 65 to 70% with mild pulmonary hypertension.  No hypoxia, doubt PE, however, will not obtain CTA chest as treatment will not change.  Discontinue Megace given this medication increase risk for blood clots.  Okay to change to Arixtra in a.m. if renal function is stable.  Sepsis secondary to UTI Urine culture grew Enterobacter aerogenes, patient treated with vancomycin and cefepime on 8/25.  Fever have defervescence, patient was started on Bactrim, however developed with AKI.  Sensitivities shows sensitive to cephalosporin will switch to [facility] to protect renal function.  Unclear why WBC continues to increase, may be related to dehydration versus malignancy versus infectious process.  I have discussed with family that patient might need palliative care services and they agree on consultation.  Pleural effusion Suspect malignancy, cytology pending.  S/p Ultrasound-guided thoracentesis with 1 L removed.  No growth on  fluid culture. Will repeat chest x-ray to reassess effusion, if increasing will consider re-tap.  AKI on CKD stage III Likely related to poor oral intake, and combination of patient started on Bactrim for UTI.  Discontinue Bactrim.  Avoid nephrotoxic agents.  Patient given IV fluid and encourage oral hydration.  Avoid hypotension.  Anemia of chronic disease from malignancy No signs of overt bleeding, hemoglobin stable. Transfuse if hemoglobin less than 8  Metastatic bladder cancer Follows Dr. Irene Limbo and outpatient.  Poor prognosis  Severe protein caloric malnutrition due to chronic disease Continue nutritional supplements and Remeron to help with appetite.  Avoid Megace as this can increase risk of blood clots.  GERD Continue Pepcid 20 mg twice daily  DVT prophylaxis: IV heparin Code Status: Full code Family Communication: None at bedside Disposition Plan: Home with home health when afebrile and improving WBC.  Consultants:   Palliative  Procedures:     Antimicrobials: Anti-infectives (From admission, onward)   Start     Dose/Rate Route Frequency Ordered Stop   08/03/18 1000  cefdinir (OMNICEF) capsule 300 mg     300 mg Oral Every 12 hours 08/03/18 0806     08/02/18 1500  vancomycin (VANCOCIN) IVPB 1000 mg/200 mL premix  Status:  Discontinued     1,000 mg 200 mL/hr over 60 Minutes Intravenous Every 24 hours 08/01/18 1431 08/02/18 1444   08/02/18 1500  sulfamethoxazole-trimethoprim (BACTRIM DS,SEPTRA DS) 800-160 MG per tablet 1 tablet  Status:  Discontinued     1 tablet Oral Every 12 hours 08/02/18 1444 08/03/18 0806   08/01/18 1445  vancomycin (VANCOCIN) 1,500 mg in sodium chloride 0.9 % 500 mL IVPB     1,500 mg 250 mL/hr over 120 Minutes  Intravenous  Once 08/01/18 1431 08/01/18 1840   08/01/18 1430  ceFEPIme (MAXIPIME) 2 g in sodium chloride 0.9 % 100 mL IVPB  Status:  Discontinued     2 g 200 mL/hr over 30 Minutes Intravenous Every 12 hours 08/01/18 1410 08/02/18 1444     08/01/18 1415  vancomycin (VANCOCIN) IVPB 1000 mg/200 mL premix  Status:  Discontinued     1,000 mg 200 mL/hr over 60 Minutes Intravenous  Once 08/01/18 1412 08/01/18 1430       Objective: Vitals:   08/02/18 2023 08/03/18 0448 08/03/18 1350 08/03/18 1356  BP: 111/72 106/71 118/72   Pulse: (!) 120 (!) 118 (!) 118 (!) 117  Resp: 20 18 (!) 30   Temp: 98.5 F (36.9 C) 99.4 F (37.4 C)  99.6 F (37.6 C)  TempSrc: Oral Oral  Oral  SpO2: 94% 98% 92% 95%  Weight:      Height:        Intake/Output Summary (Last 24 hours) at 08/03/2018 1524 Last data filed at 08/03/2018 1456 Gross per 24 hour  Intake 631.94 ml  Output 400 ml  Net 231.94 ml   Filed Weights   07/26/2018 1707  Weight: 67.7 kg    Examination:  General: NAD  Cardiovascular: RRR, S1/S2 +, no rubs, no gallops Respiratory: Breath sounds diminished, R>L, some basilar crackles on the R, no wheezing Abdominal: Soft, NT, ND, bowel sounds + Extremities: no edema.   Data Reviewed: I have personally reviewed following labs and imaging studies  CBC: Recent Labs  Lab 07/24/2018 0847 07/12/2018 1708  07/30/18 0037 07/31/18 0135 08/01/18 0443 08/02/18 0441 08/03/18 0151  WBC 27.6* 21.1*   < > 21.5* 20.0* 28.1* 36.3* 40.5*  NEUTROABS 20.6* 16.5*  --   --   --  19.7*  --   --   HGB 9.3* 12.2*   < > 8.7* 8.9* 7.9* 9.1* 8.6*  HCT 29.8* 39.4   < > 28.8* 28.8* 25.7* 30.4* 27.1*  MCV 83.6 86.0   < > 87.0 86.2 86.8 88.6 85.8  PLT 653* 656*   < > 609* 641* 571* 544* 534*   < > = values in this interval not displayed.   Basic Metabolic Panel: Recent Labs  Lab 07/15/2018 0847  07/29/18 0510 07/31/18 0135 08/01/18 0443 08/02/18 0441 08/03/18 0151  NA 140   < > 142 142 143 139 139  K 4.5   < > 4.2 4.0 3.7 4.2 4.7  CL 102   < > 105 106 109 107 107  CO2 24   < > 28 25 25  21* 22  GLUCOSE 179*   < > 109* 123* 135* 121* 111*  BUN 21   < > 24* 15 13 15 22   CREATININE 1.39*   < > 1.28* 1.15 1.11 1.24 1.62*  CALCIUM 9.1   < >  8.5* 8.3* 8.1* 8.3* 8.1*  MG 2.0  --  2.2  --   --   --   --   PHOS  --   --  2.6  --   --   --   --    < > = values in this interval not displayed.   GFR: Estimated Creatinine Clearance: 40.6 mL/min (A) (by C-G formula based on SCr of 1.62 mg/dL (H)). Liver Function Tests: Recent Labs  Lab 07/08/2018 0847 07/10/2018 1708 07/29/18 0510 07/30/18 1717 08/01/18 0443  AST 61* 69* 43*  --  77*  ALT 27 33 23  --  30  ALKPHOS 125 120 92  --  119  BILITOT 0.6 0.6 0.5  --  0.9  PROT 7.2 7.9 6.3* 6.3* 5.9*  ALBUMIN 2.3* 2.9* 2.3*  --  2.0*   No results for input(s): LIPASE, AMYLASE in the last 168 hours. No results for input(s): AMMONIA in the last 168 hours. Coagulation Profile: Recent Labs  Lab 08/05/2018 1708  INR 1.14   Cardiac Enzymes: Recent Labs  Lab 07/20/2018 1708 07/21/2018 2232 07/29/18 0510 07/29/18 1100  TROPONINI 0.03* <0.03 0.03* <0.03   BNP (last 3 results) No results for input(s): PROBNP in the last 8760 hours. HbA1C: No results for input(s): HGBA1C in the last 72 hours. CBG: Recent Labs  Lab 07/30/18 1806  GLUCAP 113*   Lipid Profile: No results for input(s): CHOL, HDL, LDLCALC, TRIG, CHOLHDL, LDLDIRECT in the last 72 hours. Thyroid Function Tests: No results for input(s): TSH, T4TOTAL, FREET4, T3FREE, THYROIDAB in the last 72 hours. Anemia Panel: Recent Labs    08/03/18 0151 08/03/18 0641  VITAMINB12  --  872  FOLATE  --  6.2  FERRITIN  --  2,655*  TIBC  --  114*  IRON  --  17*  RETICCTPCT 1.2  --    Sepsis Labs: No results for input(s): PROCALCITON, LATICACIDVEN in the last 168 hours.  Recent Results (from the past 240 hour(s))  Culture, body fluid-bottle     Status: None (Preliminary result)   Collection Time: 07/30/18  4:57 PM  Result Value Ref Range Status   Specimen Description PLEURAL  Final   Special Requests FLUID  Final   Culture   Final    NO GROWTH 4 DAYS Performed at [facility], [address], [place], [place] [address]     Report Status PENDING  Incomplete  Culture, blood (Routine X 2) w Reflex to ID Panel     Status: None (Preliminary result)   Collection Time: 07/31/18  6:38 PM  Result Value Ref Range Status   Specimen Description   Final    BLOOD LEFT ANTECUBITAL Performed at [facility] [address] [place], [place] [address]    Special Requests   Final    BOTTLES DRAWN AEROBIC ONLY Blood Culture adequate volume Performed at [facility] [address] [place], [place] [address]    Culture   Final    NO GROWTH 3 DAYS Performed at [facility], [place] [address], [place], [place] [address]    Report Status PENDING  Incomplete  Culture, blood (Routine X 2) w Reflex to ID Panel     Status: None (Preliminary result)   Collection Time: 07/31/18  6:38 PM  Result Value Ref Range Status   Specimen Description   Final    BLOOD BLOOD LEFT HAND Performed at [facility] [address], [place], [place] [address]    Special Requests   Final    BOTTLES DRAWN AEROBIC ONLY Blood Culture adequate volume Performed at [place] [facility] [address] [place], [place] [address]    Culture   Final    NO GROWTH 3 DAYS Performed at [facility], [place] [address] [place], [place] [address]    Report Status PENDING  Incomplete  Urine Culture     Status: Abnormal   Collection Time: 07/31/18  7:27 PM  Result Value Ref Range Status   Specimen Description   Final    URINE, CATHETERIZED Performed at [facility]  [place] [address] [place], [place] [address]    Special Requests   Final    Immunocompromised Performed at [facility], [place] [address], [place], [place] [address]    Culture >=100,000 COLONIES/mL ENTEROBACTER AEROGENES (A)  Final   Report Status 08/03/2018 FINAL  Final   Organism ID, Bacteria ENTEROBACTER AEROGENES (A)  Final      Susceptibility   Enterobacter aerogenes - MIC*     CEFAZOLIN >=64 RESISTANT Resistant     CEFTRIAXONE <=1 SENSITIVE Sensitive     CIPROFLOXACIN <=0.25 SENSITIVE Sensitive     GENTAMICIN <=1 SENSITIVE Sensitive     IMIPENEM 2 SENSITIVE Sensitive     NITROFURANTOIN 128 RESISTANT Resistant     TRIMETH/SULFA <=20 SENSITIVE Sensitive     PIP/TAZO <=4 SENSITIVE Sensitive     * >=100,000 COLONIES/mL ENTEROBACTER AEROGENES      Radiology Studies: No results found.    Scheduled Meds: . cefdinir  300 mg Oral Q12H  . famotidine  20 mg Oral BID  . lactose free nutrition  237 mL Oral TID WC  . mirtazapine  15 mg Oral QHS  . multivitamin with minerals  1 tablet Oral Daily  . senna-docusate  2 tablet Oral QHS   Continuous Infusions: . heparin 2,100 Units/hr (08/03/18 1456)     LOS: 6 days    Time spent: Total of 25 minutes spent with pt, greater than 50% of which was spent in discussion of  treatment, counseling and coordination of care   Chipper Oman, MD Pager: Text Page via [email or web address]   If 7PM-7AM, please contact night-coverage [email or web address] 08/03/2018, 3:24 PM   Note - This record has been created using [employer]. Chart creation errors have been sought, but may not always have been located. Such creation errors do not reflect on the standard of medical care.

## 2018-08-03 NOTE — Progress Notes (Signed)
ANTICOAGULATION CONSULT NOTE - Consult  Pharmacy Consult for heparin drip Indication: VTE treatment  No Known Allergies  Patient Measurements: Height: 6' (182.9 cm) Weight: 149 lb 4 oz (67.7 kg) IBW/kg (Calculated) : 77.6 Heparin Dosing Weight: TBW  Vital Signs: Temp: 99.6 F (37.6 C) (08/27 1356) Temp Source: Oral (08/27 1356) BP: 118/72 (08/27 1350) Pulse Rate: 117 (08/27 1356)  Labs: Recent Labs    08/01/18 0443  08/02/18 0441 08/03/18 0151 08/03/18 1229  HGB 7.9*  --  9.1* 8.6*  --   HCT 25.7*  --  30.4* 27.1*  --   PLT 571*  --  544* 534*  --   HEPARINUNFRC 0.29*   < > 0.42 0.29* <0.10*  CREATININE 1.11  --  1.24 1.62*  --    < > = values in this interval not displayed.   Estimated Creatinine Clearance: 40.6 mL/min (A) (by C-G formula based on SCr of 1.62 mg/dL (H)).  Assessment: Pharmacy consulted to dose and monitor heparin infusion in this 72 year old male for VTE treatment. Patient has PMH significant for bladder cancer with lung metastases and right IJ VTE (July 2019). Patient was on enoxaparin 80 mg subcutaneously q12h PTA (last dose 07/12/2018 @ 0600) and developed right leg DVT despite being on therapeutic LMWH. Patient was converted to a heparin drip on admission in preparation for thoracentesis. Baseline aPTT 35 seconds.  Baseline labs (07/25/2018):  -Hgb 12.2 (however, Hgb was 9.1 on 07/21/18) -Plt 656 -INR 1.1 -APTT 35 seconds  08/03/2018 AM HL 0.29 on 2000 units/hr and rate increased to 2100 units/hr Follow up HL at 1230 undetectable,  RN reports IV was beeping around time of heparin level draw and IV site was moved,  IV infusing well with no signs of infiltration Hgb 8.6, low but stable PLTC 534 No bleeding or other issues noted Unable to transition to Arixtra today due to AKI with SCr up to 1.62  Goal of Therapy:  Heparin level 0.3-0.7 units/ml Monitor platelets by anticoagulation protocol: Yes   Plan:   Bolus heparin 4000 units IV x 1  now  continue heparin drip at 2100 units/hr and recheck HL in 8 hours  Daily CBC,  daily Heparin   Monitor closely for any signs or symptoms of bleeding  F/u tx to fondaparinux once renal fxn improves  Eudelia Bunch, Pharm.D [phone] 08/03/2018 2:06 PM

## 2018-08-03 NOTE — Progress Notes (Signed)
ANTICOAGULATION CONSULT NOTE - Follow Up Consult  Pharmacy Consult for Heparin Indication: VTE treatment  No Known Allergies  Patient Measurements: Height: 6' (182.9 cm) Weight: 149 lb 4 oz (67.7 kg) IBW/kg (Calculated) : 77.6 Heparin Dosing Weight:   Vital Signs: Temp: 98.5 F (36.9 C) (08/26 2023) Temp Source: Oral (08/26 2023) BP: 111/72 (08/26 2023) Pulse Rate: 120 (08/26 2023)  Labs: Recent Labs    08/01/18 0443 08/01/18 1420 08/02/18 0441 08/03/18 0151  HGB 7.9*  --  9.1* 8.6*  HCT 25.7*  --  30.4* 27.1*  PLT 571*  --  544* 534*  HEPARINUNFRC 0.29* 0.34 0.42 0.29*  CREATININE 1.11  --  1.24 1.62*    Estimated Creatinine Clearance: 40.6 mL/min (A) (by C-G formula based on SCr of 1.62 mg/dL (H)).   Medications:  Infusions:  . dextrose 5 % and 0.45% NaCl 40 mL/hr at 08/01/18 1856  . heparin 2,000 Units/hr (08/02/18 1918)    Assessment: Patient with heparin level just below goal.  No heparin issues per RN.  Goal of Therapy:  Heparin level 0.3-0.7 units/ml Monitor platelets by anticoagulation protocol: Yes   Plan:  Increase heparin to 2100 units/hr Recheck level at [address] [place] 08/03/2018,4:23 AM

## 2018-08-04 ENCOUNTER — Other Ambulatory Visit: Payer: Medicare Other

## 2018-08-04 ENCOUNTER — Ambulatory Visit: Payer: Medicare Other | Attending: Hematology | Admitting: Physical Therapy

## 2018-08-04 ENCOUNTER — Ambulatory Visit: Payer: Medicare Other | Admitting: Hematology

## 2018-08-04 DIAGNOSIS — N179 Acute kidney failure, unspecified: Secondary | ICD-10-CM

## 2018-08-04 DIAGNOSIS — Z515 Encounter for palliative care: Secondary | ICD-10-CM

## 2018-08-04 DIAGNOSIS — I10 Essential (primary) hypertension: Secondary | ICD-10-CM

## 2018-08-04 DIAGNOSIS — R748 Abnormal levels of other serum enzymes: Secondary | ICD-10-CM

## 2018-08-04 DIAGNOSIS — I82413 Acute embolism and thrombosis of femoral vein, bilateral: Principal | ICD-10-CM

## 2018-08-04 DIAGNOSIS — R531 Weakness: Secondary | ICD-10-CM

## 2018-08-04 LAB — BASIC METABOLIC PANEL
Anion gap: 11 (ref 5–15)
BUN: 25 mg/dL — ABNORMAL HIGH (ref 8–23)
CO2: 20 mmol/L — ABNORMAL LOW (ref 22–32)
Calcium: 8.1 mg/dL — ABNORMAL LOW (ref 8.9–10.3)
Chloride: 107 mmol/L (ref 98–111)
Creatinine, Ser: 1.7 mg/dL — ABNORMAL HIGH (ref 0.61–1.24)
GFR calc Af Amer: 45 mL/min — ABNORMAL LOW (ref >60)
GFR calc non Af Amer: 39 mL/min — ABNORMAL LOW (ref >60)
Glucose, Bld: 97 mg/dL (ref 70–99)
Potassium: 4.6 mmol/L (ref 3.5–5.1)
Sodium: 138 mmol/L (ref 135–145)

## 2018-08-04 LAB — CBC WITH DIFFERENTIAL/PLATELET
Basophils Absolute: 0.1 10*3/uL (ref 0.0–0.1)
Basophils Relative: 1 %
Eosinophils Absolute: 0.2 10*3/uL (ref 0.0–0.7)
Eosinophils Relative: 1 %
HCT: 27.4 % — ABNORMAL LOW (ref 39.0–52.0)
Hemoglobin: 8.5 g/dL — ABNORMAL LOW (ref 13.0–17.0)
Lymphocytes Relative: 5 %
Lymphs Abs: 1.6 10*3/uL (ref 0.7–4.0)
MCH: 26.7 pg (ref 26.0–34.0)
MCHC: 31 g/dL (ref 30.0–36.0)
MCV: 86.2 fL (ref 78.0–100.0)
Monocytes Absolute: 4.1 10*3/uL (ref 0.1–1.0)
Monocytes Relative: 14 %
Neutro Abs: 22.7 10*3/uL (ref 1.7–7.7)
Neutrophils Relative %: 79 %
Platelets: 534 10*3/uL — ABNORMAL HIGH (ref 150–400)
RBC: 3.18 MIL/uL — ABNORMAL LOW (ref 4.22–5.81)
RDW: 19.2 % — ABNORMAL HIGH (ref 11.5–15.5)
WBC: 28.7 10*3/uL — ABNORMAL HIGH (ref 4.0–10.5)

## 2018-08-04 LAB — CULTURE, BODY FLUID W GRAM STAIN -BOTTLE: Culture: NO GROWTH

## 2018-08-04 LAB — HEPARIN LEVEL (UNFRACTIONATED): Heparin Unfractionated: 0.41 IU/mL (ref 0.30–0.70)

## 2018-08-04 MED ORDER — LACTATED RINGERS IV SOLN
INTRAVENOUS | Status: DC
  Administered 2018-08-04 – 2018-08-06 (×4): via INTRAVENOUS

## 2018-08-04 NOTE — Care Management Note (Signed)
Case Management Note  Patient Details  Name: LAJUAN KOVALESKI MRN: [id number] Date of Birth: 04/24/1946  Subjective/Objective:                    Action/Plan:   Expected Discharge Date:  (unknown)               Expected Discharge Plan:  [place]  In-House Referral:     Discharge planning Services  CM Consult  Post Acute Care Choice:    Choice offered to:  Spouse  DME Arranged:    DME Agency:     HH Arranged:  RN, PT, Nurse's Aide [place] Agency:  [place]  Status of Service:  In process, will continue to follow  If discussed at Long Length of Stay Meetings, dates discussed:    Additional CommentsPurcell Mouton, RN 08/04/2018, 2:45 PM

## 2018-08-04 NOTE — Progress Notes (Signed)
Physical Therapy Treatment Patient Details Name: Jeremy Patrick MRN: [id number] DOB: 1946-03-27 Today's Date: 08/04/2018    History of Present Illness Pt is a 72 y.o. male with medical history significant of metastatic bladder cancer, HTN,  and right IJ DVT (Dxed 06/23/18) on Lovenox. He developed bilateral lower extremity DVT despite being compliant with Lovenox and was admitted on 08/05/2018.     PT Comments    Pt is cooperative but continues very unsteady on feet and fatigues easily with limited activity. HR elevating to 141 with ambulation - SaO2  92% or higher throughout.   Follow Up Recommendations  Home health PT;Supervision/Assistance - 24 hour     Equipment Recommendations  Rolling walker with 5" wheels    Recommendations for Other Services       Precautions / Restrictions Precautions Precautions: Fall Restrictions Weight Bearing Restrictions: No    Mobility  Bed Mobility Overal bed mobility: Needs Assistance Bed Mobility: Rolling;Sidelying to Sit Rolling: Supervision Sidelying to sit: Min guard Supine to sit: Min guard     General bed mobility comments: increased time and effort  Transfers Overall transfer level: Needs assistance Equipment used: Rolling walker (2 wheeled) Transfers: Sit to/from Stand Sit to Stand: Min assist;Mod assist         General transfer comment: verbal cues for safe technique, assist to rise and steady  Ambulation/Gait Ambulation/Gait assistance: Min assist Gait Distance (Feet): 37 Feet Assistive device: Rolling walker (2 wheeled) Gait Pattern/deviations: Step-through pattern;Decreased stride length;Trunk flexed Gait velocity: decreased   General Gait Details: very unsteady gait requiring min assist for balance, fatigues quickly, multiple standing rests required   Stairs             Wheelchair Mobility    Modified Rankin (Stroke Patients Only)       Balance Overall balance assessment: Needs  assistance Sitting-balance support: No upper extremity supported;Feet supported Sitting balance-Leahy Scale: Fair     Standing balance support: Bilateral upper extremity supported;During functional activity Standing balance-Leahy Scale: Poor Standing balance comment: reliant on RW for balance                            Cognition Arousal/Alertness: Awake/alert Behavior During Therapy: Flat affect Overall Cognitive Status: Within Functional Limits for tasks assessed                                        Exercises      General Comments        Pertinent Vitals/Pain Pain Assessment: No/denies pain    Home Living                      Prior Function            PT Goals (current goals can now be found in the care plan section) Acute Rehab PT Goals Patient Stated Goal: home per wife PT Goal Formulation: With patient/family Time For Goal Achievement: 08/15/18 Potential to Achieve Goals: Fair Progress towards PT goals: Progressing toward goals    Frequency    Min 3X/week      PT Plan Current plan remains appropriate    Co-evaluation              AM-PAC PT "6 Clicks" Daily Activity  Outcome Measure  Difficulty turning over in bed (including adjusting bedclothes, sheets and blankets)?: A  Lot Difficulty moving from lying on back to sitting on the side of the bed? : A Lot Difficulty sitting down on and standing up from a chair with arms (e.g., wheelchair, bedside commode, etc,.)?: Unable Help needed moving to and from a bed to chair (including a wheelchair)?: A Little Help needed walking in hospital room?: A Little Help needed climbing 3-5 steps with a railing? : A Lot 6 Click Score: 13    End of Session Equipment Utilized During Treatment: Gait belt Activity Tolerance: Patient limited by fatigue;Other (comment)(HR elevated to 141) Patient left: in chair;with call bell/phone within reach;with chair alarm set;with  family/visitor present Nurse Communication: Mobility status PT Visit Diagnosis: Difficulty in walking, not elsewhere classified (R26.2);Muscle weakness (generalized) (M62.81)     Time: 8127-5170 PT Time Calculation (min) (ACUTE ONLY): 25 min  Charges:  $Gait Training: 8-22 mins $Therapeutic Activity: 8-22 mins                     Pg [phone]    Perfecto Purdy 08/04/2018, 10:36 AM

## 2018-08-04 NOTE — Progress Notes (Signed)
ANTICOAGULATION CONSULT NOTE - Consult  Pharmacy Consult for heparin drip Indication: VTE treatment  No Known Allergies  Patient Measurements: Height: 6' (182.9 cm) Weight: 149 lb 4 oz (67.7 kg) IBW/kg (Calculated) : 77.6 Heparin Dosing Weight: TBW  Vital Signs: Temp: 99.6 F (37.6 C) (08/28 0402) Temp Source: Oral (08/28 0402) BP: 138/100 (08/28 0402) Pulse Rate: 118 (08/28 0402)  Labs: Recent Labs    08/02/18 0441 08/03/18 0151 08/03/18 1229 08/03/18 1548 08/03/18 2218 08/04/18 0748  HGB 9.1* 8.6*  --  7.8*  --   --   HCT 30.4* 27.1*  --  25.1*  --   --   PLT 544* 534*  --  485*  --   --   HEPARINUNFRC 0.42 0.29* <0.10*  --  0.43 0.41  CREATININE 1.24 1.62*  --   --   --  1.70*   Estimated Creatinine Clearance: 38.7 mL/min (A) (by C-G formula based on SCr of 1.7 mg/dL (H)).  Assessment: Pharmacy consulted to dose and monitor heparin infusion in this 72 year old male for VTE treatment. Patient has PMH significant for bladder cancer with lung metastases and right IJ VTE (July 2019). Patient was on enoxaparin 80 mg subcutaneously q12h PTA (last dose 07/16/2018 @ 0600) and developed right leg DVT despite being on therapeutic LMWH. Patient was converted to a heparin drip on admission in preparation for thoracentesis. Baseline aPTT 35 seconds.  Baseline labs (08/01/2018):  -Hgb 12.2 (however, Hgb was 9.1 on 07/21/18) -Plt 656 -INR 1.1 -APTT 35 seconds  08/04/2018  Heparin level = 0.41on 2100 units/hr   Hgb decreased 7.8, low but stable Pltc 485  No bleeding or other issues noted  Unable to transition to Arixtra today due to AKI with SCr up to 1.7  Goal of Therapy:  Heparin level 0.3-0.7 units/ml Monitor platelets by anticoagulation protocol: Yes   Plan:   Heparin drip at 2100 units/hr and recheck HL in 8 hours  Daily CBC,  daily Heparin   Monitor closely for any signs or symptoms of bleeding  F/u tx to fondaparinux once renal fxn improves (SCr worse  today)  Peggyann Juba, PharmD, BCPS Pager: [phone] 08/04/2018 8:40 AM

## 2018-08-04 NOTE — Consult Note (Signed)
Consultation Note Date: 08/04/2018   Patient Name: Jeremy Patrick  DOB: 1946-08-26  MRN: [id number]  Age / Sex: 72 y.o., male  PCP: Martinique, Betty G, MD Referring Physician: Donne Hazel, MD  Reason for Consultation: Establishing goals of care and Psychosocial/spiritual support  HPI/Patient Profile: 71 y.o. male   admitted on 07/08/2018 with past    medical history significant for metastatic bladder cancer/Dr Jeremy Patrick, HTN,  Right IJ DVT diagnosed 06-23-18 on Lovenox    Patient recently has been switched from chemotherapy to immunotherapy/ Pembrolizumab    Admitted through the ER  with 3 day hx of Right leg pain and swelling recent US showed Right leg DVT.   Patient has had continued physical and functional decline over the past several months; poor po intake and significant weight loss.  The patient's wife has been the main caregiver, she has significant health conditions of her own and it is becoming not possible for her to care for him in their home.  Patient and family face treatment option decisions, advanced directive decisions, and anticipatory care needs.    Clinical Assessment and Goals of Care:  This NP Jeremy Patrick reviewed medical records, received report from team, assessed the patient and then meet at the patient's bedside along with his wife  to discuss diagnosis, prognosis, GOC, EOL wishes disposition and options.  I then spoke to the patient's son and healthcare power of attorney Jeremy Patrick and reviewed the same information as above  Concept of Hospice and Palliative Care were discussed   Detailed hospice benefit both in the home versus residential hospice.   A detailed discussion was had today regarding advanced directives.  Concepts specific to code status, artifical feeding and hydration, continued IV antibiotics and rehospitalization was had.  The difference between a aggressive  medical intervention path  and a palliative comfort care path for this patient at this time was had.  Values and goals of care important to patient and family were attempted to be elicited.  Natural trajectory and expectations at EOL were discussed.  Questions and concerns addressed.   Family encouraged to call with questions or concerns.    PMT will continue to support holistically.  HCPOA    SUMMARY OF RECOMMENDATIONS    Code Status/Advance Care Planning:  Full code-encouraged patient to consider DNR/DNI status knowing poor outcomes in similar patients.  Additional Recommendations (Limitations, Scope, Preferences):  Full Scope Treatment  Psycho-social/Spiritual:   Desire for further Chaplaincy support:no- strong community church support  Additional Recommendations: Education on Hospice  Prognosis:   < 6 months--if this will be dependent on desire for life prolonging measures.  Discharge Planning: To Be Determined      Primary Diagnoses: Present on Admission: . DVT (deep venous thrombosis) ([place]) . Elevated troponin . Essential hypertension . Gastroesophageal reflux disease without esophagitis . Malignant neoplasm of urinary bladder ([place]) . DVT (deep vein thrombosis) in pregnancy [facility])   I have reviewed the medical record, interviewed the patient and family, and examined the patient. The  following aspects are pertinent.  Past Medical History:  Diagnosis Date  . Cancer [facility])    bladder cancer  . GERD (gastroesophageal reflux disease)   . Hypertension    Social History   Socioeconomic History  . Marital status: Married    Spouse name: Not on file  . Number of children: 4  . Years of education: 10  . Highest education level: Not on file  Occupational History  . Occupation: Retired  Scientific laboratory technician  . Financial resource strain: Not on file  . Food insecurity:    Worry: Not on file    Inability: Not on file  . Transportation needs:    Medical: Not on file      Non-medical: Not on file  Tobacco Use  . Smoking status: Former Smoker    Packs/day: 0.15    Years: 10.00    Pack years: 1.50    Types: Cigarettes    Last attempt to quit: 1980    Years since quitting: 39.6  . Smokeless tobacco: Never Used  Substance and Sexual Activity  . Alcohol use: Not Currently  . Drug use: No    Comment: "years ago"  . Sexual activity: Not on file  Lifestyle  . Physical activity:    Days per week: Not on file    Minutes per session: Not on file  . Stress: Not on file  Relationships  . Social connections:    Talks on phone: Not on file    Gets together: Not on file    Attends religious service: Not on file    Active member of club or organization: Not on file    Attends meetings of clubs or organizations: Not on file    Relationship status: Not on file  Other Topics Concern  . Not on file  Social History Narrative   Fun/Hobby: Go to churc and travel.    Family History  Problem Relation Age of Onset  . Hypertension Mother   . Stroke Mother   . Diabetes Mother   . Cataracts Mother   . Kidney disease Mother   . Hypertension Father   . Stroke Father   . Aneurysm Maternal Grandmother    Scheduled Meds: . cefdinir  300 mg Oral Q12H  . famotidine  20 mg Oral BID  . lactose free nutrition  237 mL Oral TID WC  . mirtazapine  15 mg Oral QHS  . multivitamin with minerals  1 tablet Oral Daily  . senna-docusate  2 tablet Oral QHS   Continuous Infusions: . heparin 2,100 Units/hr (08/04/18 0722)  . lactated ringers 75 mL/hr at 08/04/18 1205   PRN Meds:.acetaminophen **OR** acetaminophen, alum & mag hydroxide-simeth, bismuth subsalicylate, HYDROcodone-acetaminophen, metoprolol tartrate, ondansetron **OR** ondansetron (ZOFRAN) IV Medications Prior to Admission:  Prior to Admission medications   Medication Sig Start Date End Date Taking? Authorizing Provider  acetaminophen (TYLENOL) 500 MG tablet Take 1,000 mg by mouth daily as needed for moderate  pain.    Yes [provider]  benzonatate (TESSALON) 100 MG capsule Take 1 capsule (100 mg total) by mouth 3 (three) times daily as needed for cough. 05/19/18  Yes Jeremy Genera, MD  Cyanocobalamin (VITAMIN B 12 PO) Take 1 tablet by mouth daily.   Yes [provider]  diltiazem (CARDIZEM CD) 360 MG 24 hr capsule TAKE 1 CAPSULE BY MOUTH  DAILY. 06/18/18  Yes Martinique, Betty G, MD  enoxaparin (LOVENOX) 80 MG/0.8ML injection Inject 0.8 mLs (80 mg total) into the  skin every 12 (twelve) hours. Start taking 06/24/2018 at 6pm 06/23/18  Yes Jeremy Genera, MD  Ferrous Gluconate (IRON 27 PO) Take 27 mg by mouth daily.   Yes [provider]  Glucos-Chondroit-Hyaluron-MSM (GLUCOSAMINE CHONDROITIN JOINT PO) Take 1,500 mg by mouth daily.   Yes [provider]  HYDROcodone-acetaminophen (NORCO/VICODIN) 5-325 MG tablet Take 1 tablet by mouth every 6 (six) hours as needed for moderate pain or severe pain. 06/30/18  Yes Jeremy Genera, MD  mirtazapine (REMERON) 15 MG tablet Take 1 tablet (15 mg total) by mouth at bedtime. 06/30/18  Yes Jeremy Genera, MD  ondansetron (ZOFRAN-ODT) 4 MG disintegrating tablet Take 1 tablet (4 mg total) by mouth every 8 (eight) hours as needed for nausea or vomiting. 04/28/18  Yes Jeremy Genera, MD  senna-docusate (SENNA S) 8.6-50 MG tablet Take 2 tablets by mouth at bedtime. 06/09/18  Yes Jeremy Genera, MD  cefpodoxime (VANTIN) 100 MG tablet Take 1 tablet (100 mg total) by mouth 2 (two) times daily. Patient not taking: Reported on 06/23/2018 06/09/18   Jeremy Genera, MD  dronabinol (MARINOL) 5 MG capsule Take 1 capsule (5 mg total) by mouth 2 (two) times daily before a meal. Patient not taking: Reported on 08/05/2018 05/19/18   Jeremy Genera, MD  guaiFENesin-dextromethorphan [facility] DM) 100-10 MG/5ML syrup Take 5 mLs by mouth 3 (three) times daily as needed for cough. Patient not taking: Reported on 08/06/2018  04/26/18   Davonna Belling, MD  phenazopyridine (PYRIDIUM) 200 MG tablet Take 1 tablet (200 mg total) by mouth 3 (three) times daily. Patient not taking: Reported on 07/31/2018 05/29/18   Tanna Furry, MD  ranitidine (ZANTAC) 300 MG tablet TAKE 1 TABLET BY MOUTH  EVERY MORNING. Patient not taking: Reported on 07/27/2018 06/18/18   Martinique, Betty G, MD  sildenafil (VIAGRA) 50 MG tablet Take 1 tablet (50 mg total) by mouth daily as needed for erectile dysfunction. Patient not taking: Reported on 07/21/2018 04/20/18   Martinique, Betty G, MD  prochlorperazine (COMPAZINE) 10 MG tablet Take 1 tablet (10 mg total) by mouth every 6 (six) hours as needed (Nausea or vomiting). 05/18/18 07/21/18  Jeremy Genera, MD   No Known Allergies Review of Systems  Neurological: Positive for weakness.    Physical Exam  Constitutional: He is oriented to person, place, and time. He appears cachectic. He appears ill.  Cardiovascular: Tachycardia present.  Pulmonary/Chest: Effort normal and breath sounds normal.  Neurological: He is alert and oriented to person, place, and time.  Skin: Skin is warm and dry.    Vital Signs: BP 112/76 (BP Location: Right Arm)   Pulse (!) 118   Temp 99.6 F (37.6 C) (Oral)   Resp 20   Ht 6' (1.829 m)   Wt 67.7 kg   SpO2 92%   BMI 20.24 kg/m  Pain Scale: 0-10   Pain Score: 10-Worst pain ever("When it hurts")   SpO2: SpO2: 92 % O2 Device:SpO2: 92 % O2 Flow Rate: .O2 Flow Rate (L/min): 0 L/min  IO: Intake/output summary:   Intake/Output Summary (Last 24 hours) at 08/04/2018 1321 Last data filed at 08/04/2018 0700 Gross per 24 hour  Intake 900.6 ml  Output 100 ml  Net 800.6 ml    LBM: Last BM Date: 07/30/18 Baseline Weight: Weight: 67.7 kg Most recent weight: Weight: 67.7 kg     Palliative Assessment/Data:  30 % at best    Discussed with Dr Wyline Copas  Time In: 1515 Time  Out: 1630 Time Total: 75 minutes Greater than 50%  of this time was spent counseling and  coordinating care related to the above assessment and plan.  Signed by: Jeremy Lessen, NP   Please contact Palliative Medicine Team phone at [phone] for questions and concerns.  For individual provider: See Shea Evans

## 2018-08-04 NOTE — Progress Notes (Addendum)
ANTICOAGULATION CONSULT NOTE - Follow Up Consult  Pharmacy Consult for Heparin Indication: VTE treatment  No Known Allergies  Patient Measurements: Height: 6' (182.9 cm) Weight: 149 lb 4 oz (67.7 kg) IBW/kg (Calculated) : 77.6 Heparin Dosing Weight:   Vital Signs: Temp: 99.6 F (37.6 C) (08/28 0402) Temp Source: Oral (08/28 0402) BP: 138/100 (08/28 0402) Pulse Rate: 118 (08/28 0402)  Labs: Recent Labs    08/02/18 0441 08/03/18 0151 08/03/18 1229 08/03/18 1548 08/03/18 2218  HGB 9.1* 8.6*  --  7.8*  --   HCT 30.4* 27.1*  --  25.1*  --   PLT 544* 534*  --  485*  --   HEPARINUNFRC 0.42 0.29* <0.10*  --  0.43  CREATININE 1.24 1.62*  --   --   --     Estimated Creatinine Clearance: 40.6 mL/min (A) (by C-G formula based on SCr of 1.62 mg/dL (H)).   Medications:  Infusions:  . heparin 2,100 Units/hr (08/03/18 1954)    Assessment: Patient with heparin level at goal.  No heparin issues noted.  Goal of Therapy:  Heparin level 0.3-0.7 units/ml Monitor platelets by anticoagulation protocol: Yes   Plan:  Continue heparin drip at current rate Recheck level at AM labs  Tyler Deis, Shea Stakes Crowford 08/04/2018,5:47 AM

## 2018-08-04 NOTE — Progress Notes (Signed)
PROGRESS NOTE    Jeremy Patrick  XKG:[id number] DOB: 07/15/1946 DOA: 07/27/2018 PCP: Martinique, Betty G, MD    Brief Narrative:  72 y.o.malewith medical history significant of metastatic bladder cancer, HTN, Right IJ DVT (Dxed 06/23/18) on Lovenox, sadly has developed bilateral lower extremity DVT despite being compliant with Lovenox admitted on 07/25/2018.  Patient was started on heparin drip.  Oncology was consulted and recommended Arixtra upon discharge.  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   -Despite compliance with Lovenox patient still having acute DVTs.   -Patient been treated with IV heparin, oncology was consulted and recommended to discharge Arixtra.   -Echocardiogram shows 65 to 70% with mild pulmonary hypertension.   -Patient without hypoxemia ,-Had discontinued Megace given increased risk for blood clots. -Anticipate transition to Arixtra when renal function improves  Sepsis secondary to UTI -Urine culture grew Enterobacter aerogenes, patient treated with vancomycin and cefepime on 8/25.   -Fever have defervescence, patient was started on Bactrim, however developed with AKI.   -Sensitivities shows sensitive to cephalosporin. Have since transitioned to Omnicef  -WBC somewhat improved overnight -Leukocytosis already known to be attributed to underlying cancer -Will repeat CBC in AM -Palliative Care consulted  Pleural effusion -S/p thoracentesis with pathology positive for malignant  cells -Oncology following -If fluid re-accumilates and pt symptomatic, would consider therapeutic thoracentesis at that time.  AKI on CKD stage III -Likely secondary to dehydration as well as renal failure related to recent Bactrim -Clinically dehydrated on exam with poor skin turgor, dry mucus membranes, increased thirst -Will continue on LR at 75cc/hr -Avoid nephrotoxic agents  Anemia of chronic disease from malignancy -No evidence of acute blood loss -Will repeat CBC in AM, plan to transfuse if hgb <8  Metastatic bladder cancer -Patient is followed by Dr. Irene Limbo as outpatient.   -Poor prognosis -Palliative Care consulted per above  Severe protein caloric malnutrition due to chronic disease -Continue nutritional supplements and Remeron to help with appetite.   -Megace stopped secondary to increased hypercoagulability  GERD -Will continue on Pepcid 20 mg twice daily  DVT prophylaxis: Heparin gtt Code Status: Full Family Communication: Pt in room, wife at bedside Disposition Plan: Uncertain at this time  Consultants:   Oncology  Palliative Care  Procedures:   US guided thoracentesis 8/23  Antimicrobials: Anti-infectives (From admission, onward)   Start     Dose/Rate Route Frequency Ordered Stop   08/03/18 1000  cefdinir (OMNICEF) capsule 300 mg     300 mg Oral Every 12 hours 08/03/18 0806     08/02/18 1500  vancomycin (VANCOCIN) IVPB 1000 mg/200 mL premix  Status:  Discontinued     1,000 mg 200 mL/hr over 60 Minutes Intravenous Every 24 hours 08/01/18 1431 08/02/18 1444   08/02/18 1500  sulfamethoxazole-trimethoprim (BACTRIM DS,SEPTRA DS) 800-160 MG per tablet 1 tablet  Status:  Discontinued     1 tablet Oral Every 12 hours 08/02/18 1444 08/03/18 0806   08/01/18 1445  vancomycin (VANCOCIN) 1,500 mg in sodium chloride 0.9 % 500 mL IVPB  1,500 mg 250 mL/hr over 120 Minutes Intravenous  Once 08/01/18 1431 08/01/18 1840   08/01/18 1430  ceFEPIme (MAXIPIME) 2 g  in sodium chloride 0.9 % 100 mL IVPB  Status:  Discontinued     2 g 200 mL/hr over 30 Minutes Intravenous Every 12 hours 08/01/18 1410 08/02/18 1444   08/01/18 1415  vancomycin (VANCOCIN) IVPB 1000 mg/200 mL premix  Status:  Discontinued     1,000 mg 200 mL/hr over 60 Minutes Intravenous  Once 08/01/18 1412 08/01/18 1430       Subjective: Without complaints  Objective: Vitals:   08/03/18 2058 08/04/18 0402 08/04/18 1100 08/04/18 1553  BP: (!) 145/94 (!) 138/100 112/76 117/80  Pulse: (!) 121 (!) 118 (!) 118 (!) 122  Resp: (!) 24 20  18   Temp: 100.3 F (37.9 C) 99.6 F (37.6 C)  99 F (37.2 C)  TempSrc: Oral Oral    SpO2: 95% 92%  93%  Weight:      Height:        Intake/Output Summary (Last 24 hours) at 08/04/2018 1627 Last data filed at 08/04/2018 1555 Gross per 24 hour  Intake 677.54 ml  Output 275 ml  Net 402.54 ml   Filed Weights   08/04/2018 1707  Weight: 67.7 kg    Examination:  General exam: Appears calm and comfortable  Respiratory system: Clear to auscultation. Respiratory effort normal. Cardiovascular system: S1 & S2 heard, RRR. Gastrointestinal system: Abdomen is nondistended, soft and nontender. No organomegaly or masses felt. Normal bowel sounds heard. Central nervous system: Alert and oriented. No focal neurological deficits. Extremities: Symmetric 5 x 5 power. Skin: No rashes, lesions Psychiatry: Judgement and insight appear normal. Mood & affect appropriate.   Data Reviewed: I have personally reviewed following labs and imaging studies  CBC: Recent Labs  Lab 07/26/2018 1708  08/01/18 0443 08/02/18 0441 08/03/18 0151 08/03/18 1548 08/04/18 0748  WBC 21.1*   < > 28.1* 36.3* 40.5* 33.6* 28.7*  NEUTROABS 16.5*  --  19.7*  --   --  26.9* 22.7  HGB 12.2*   < > 7.9* 9.1* 8.6* 7.8* 8.5*  HCT 39.4   < > 25.7* 30.4* 27.1* 25.1* 27.4*  MCV 86.0   < > 86.8 88.6 85.8 85.1 86.2  PLT 656*   < > 571* 544* 534* 485* 534*   < > = values in this interval not  displayed.   Basic Metabolic Panel: Recent Labs  Lab 07/29/18 0510 07/31/18 0135 08/01/18 0443 08/02/18 0441 08/03/18 0151 08/04/18 0748  NA 142 142 143 139 139 138  K 4.2 4.0 3.7 4.2 4.7 4.6  CL 105 106 109 107 107 107  CO2 28 25 25  21* 22 20*  GLUCOSE 109* 123* 135* 121* 111* 97  BUN 24* 15 13 15 22  25*  CREATININE 1.28* 1.15 1.11 1.24 1.62* 1.70*  CALCIUM 8.5* 8.3* 8.1* 8.3* 8.1* 8.1*  MG 2.2  --   --   --   --   --   PHOS 2.6  --   --   --   --   --    GFR: Estimated Creatinine Clearance: 38.7 mL/min (A) (by C-G formula based on SCr of 1.7 mg/dL (H)). Liver Function Tests: Recent Labs  Lab 07/29/2018 1708 07/29/18 0510 07/30/18 1717 08/01/18 0443  AST 69* 43*  --  77*  ALT 33 23  --  30  ALKPHOS 120 92  --  119  BILITOT 0.6 0.5  --  0.9  PROT 7.9 6.3* 6.3* 5.9*  ALBUMIN 2.9* 2.3*  --  2.0*   No results for input(s): LIPASE, AMYLASE in the last 168 hours. No results for input(s): AMMONIA in the last 168 hours. Coagulation Profile: Recent Labs  Lab 07/08/2018 1708  INR 1.14   Cardiac Enzymes: Recent Labs  Lab 07/08/2018 1708 07/15/2018 2232 07/29/18 0510 07/29/18 1100  TROPONINI 0.03* <0.03 0.03* <0.03   BNP (last 3 results) No results for input(s): PROBNP in the last 8760 hours. HbA1C: No results for input(s): HGBA1C in the last 72 hours. CBG: Recent Labs  Lab 07/30/18 1806  GLUCAP 113*   Lipid Profile: No results for input(s): CHOL, HDL, LDLCALC, TRIG, CHOLHDL, LDLDIRECT in the last 72 hours. Thyroid Function Tests: No results for input(s): TSH, T4TOTAL, FREET4, T3FREE, THYROIDAB in the last 72 hours. Anemia Panel: Recent Labs    08/03/18 0151 08/03/18 0641  VITAMINB12  --  872  FOLATE  --  6.2  FERRITIN  --  2,655*  TIBC  --  114*  IRON  --  17*  RETICCTPCT 1.2  --    Sepsis Labs: No results for input(s): PROCALCITON, LATICACIDVEN in the last 168 hours.  Recent Results (from the past 240 hour(s))  Culture, body fluid-bottle     Status:  None   Collection Time: 07/30/18  4:57 PM  Result Value Ref Range Status   Specimen Description PLEURAL  Final   Special Requests FLUID  Final   Culture   Final    NO GROWTH 5 DAYS Performed at [facility], [address], [place], [place] [address]    Report Status 08/04/2018 FINAL  Final  Culture, blood (Routine X 2) w Reflex to ID Panel     Status: None (Preliminary result)   Collection Time: 07/31/18  6:38 PM  Result Value Ref Range Status   Specimen Description   Final    BLOOD LEFT ANTECUBITAL Performed at [facility] [address], [place], [place] [address]    Special Requests   Final    BOTTLES DRAWN AEROBIC ONLY Blood Culture adequate volume Performed at [facility] [address], [place], [place] [address]    Culture   Final    NO GROWTH 4 DAYS Performed at [facility], [place] [address] [place], [place] [address]    Report Status PENDING  Incomplete  Culture, blood (Routine X 2) w Reflex to ID Panel     Status: None (Preliminary result)   Collection Time: 07/31/18  6:38 PM  Result Value Ref Range Status   Specimen Description   Final    BLOOD BLOOD LEFT HAND Performed at [facility] [address], [place], [place] [address]    Special Requests   Final    BOTTLES DRAWN AEROBIC ONLY Blood Culture adequate volume Performed at [facility] [address] [place], [place] [address]    Culture   Final    NO GROWTH 4 DAYS Performed at [facility], [place] [address] [place],  [address]    Report Status PENDING  Incomplete  Urine Culture     Status: Abnormal   Collection Time: 07/31/18  7:27 PM  Result Value Ref Range Status   Specimen Description   Final    URINE, CATHETERIZED Performed at [facility] [address], [place],  [address]    Special Requests   Final    Immunocompromised Performed  at [facility], [place] [address], [place], [place] [address]    Culture >=100,000 COLONIES/mL ENTEROBACTER AEROGENES (A)  Final   Report Status 08/03/2018 FINAL  Final   Organism ID, Bacteria ENTEROBACTER AEROGENES (A)  Final      Susceptibility   Enterobacter aerogenes - MIC*    CEFAZOLIN >=64 RESISTANT Resistant     CEFTRIAXONE <=1 SENSITIVE Sensitive     CIPROFLOXACIN <=0.25 SENSITIVE Sensitive     GENTAMICIN <=1 SENSITIVE Sensitive     IMIPENEM 2 SENSITIVE Sensitive     NITROFURANTOIN 128 RESISTANT Resistant     TRIMETH/SULFA <=20 SENSITIVE Sensitive     PIP/TAZO <=4 SENSITIVE Sensitive     * >=100,000 COLONIES/mL ENTEROBACTER AEROGENES     Radiology Studies: Dg Chest Port 1 View  Result Date: 08/03/2018 CLINICAL DATA:  History of pleural effusion, cough, follow-up, history of bladder carcinoma EXAM: PORTABLE CHEST 1 VIEW COMPARISON:  Chest x-ray of 08/01/2018, and CT chest of 04/26/2018 FINDINGS: There is little change in the right pleural effusion and right basilar opacities some which is due to atelectasis. Bilateral lung nodules and masses again are noted consistent with diffuse lung metastases. No left pleural effusion is seen. Cardiomegaly is stable. IMPRESSION: 1. Little change in volume of right pleural effusion with right basilar volume loss. 2. Stable lung nodules/masses consistent with diffuse lung metastases. Electronically Signed   By: Ivar Drape M.D.   On: 08/03/2018 16:30    Scheduled Meds: . cefdinir  300 mg Oral Q12H  . famotidine  20 mg Oral BID  . lactose free nutrition  237 mL Oral TID WC  . mirtazapine  15 mg Oral QHS  . multivitamin with minerals  1 tablet Oral Daily  . senna-docusate  2 tablet Oral QHS   Continuous Infusions: . heparin 2,100 Units/hr (08/04/18 0722)  . lactated ringers Stopped (08/04/18 1414)     LOS: 7 days   Marylu Lund, MD [facility] Pager [phone]  If 7PM-7AM, please contact night-coverage [email or web address] Password  [facility] 08/04/2018, 4:27 PM

## 2018-08-05 DIAGNOSIS — I82409 Acute embolism and thrombosis of unspecified deep veins of unspecified lower extremity: Secondary | ICD-10-CM

## 2018-08-05 LAB — CULTURE, BLOOD (ROUTINE X 2)
Culture: NO GROWTH
Culture: NO GROWTH
Special Requests: ADEQUATE
Special Requests: ADEQUATE

## 2018-08-05 LAB — BASIC METABOLIC PANEL
Anion gap: 11 (ref 5–15)
BUN: 27 mg/dL — ABNORMAL HIGH (ref 8–23)
CO2: 20 mmol/L — ABNORMAL LOW (ref 22–32)
Calcium: 7.9 mg/dL — ABNORMAL LOW (ref 8.9–10.3)
Chloride: 106 mmol/L (ref 98–111)
Creatinine, Ser: 1.62 mg/dL — ABNORMAL HIGH (ref 0.61–1.24)
GFR calc Af Amer: 48 mL/min — ABNORMAL LOW (ref >60)
GFR calc non Af Amer: 41 mL/min — ABNORMAL LOW (ref >60)
Glucose, Bld: 94 mg/dL (ref 70–99)
Potassium: 5.8 mmol/L — ABNORMAL HIGH (ref 3.5–5.1)
Sodium: 137 mmol/L (ref 135–145)

## 2018-08-05 LAB — CBC WITH DIFFERENTIAL/PLATELET
Basophils Absolute: 0 10*3/uL (ref 0.0–0.1)
Basophils Relative: 0 %
Eosinophils Absolute: 0.3 10*3/uL (ref 0.0–0.7)
Eosinophils Relative: 1 %
HCT: 23.2 % — ABNORMAL LOW (ref 39.0–52.0)
Hemoglobin: 7.3 g/dL — ABNORMAL LOW (ref 13.0–17.0)
Lymphocytes Relative: 8 %
Lymphs Abs: 2.2 10*3/uL (ref 0.7–4.0)
MCH: 26.4 pg (ref 26.0–34.0)
MCHC: 31.5 g/dL (ref 30.0–36.0)
MCV: 84.1 fL (ref 78.0–100.0)
Monocytes Absolute: 4.7 10*3/uL — ABNORMAL HIGH (ref 0.1–1.0)
Monocytes Relative: 17 %
Neutro Abs: 20.4 10*3/uL — ABNORMAL HIGH (ref 1.7–7.7)
Neutrophils Relative %: 74 %
Platelets: 497 10*3/uL — ABNORMAL HIGH (ref 150–400)
RBC: 2.76 MIL/uL — ABNORMAL LOW (ref 4.22–5.81)
RDW: 19.5 % — ABNORMAL HIGH (ref 11.5–15.5)
WBC: 27.6 10*3/uL — ABNORMAL HIGH (ref 4.0–10.5)

## 2018-08-05 LAB — HEPARIN LEVEL (UNFRACTIONATED)
Heparin Unfractionated: 0.16 IU/mL — ABNORMAL LOW (ref 0.30–0.70)
Heparin Unfractionated: 0.46 IU/mL (ref 0.30–0.70)

## 2018-08-05 MED ORDER — LACTATED RINGERS IV BOLUS
250.0000 mL | Freq: Once | INTRAVENOUS | Status: AC
  Administered 2018-08-05: 250 mL via INTRAVENOUS

## 2018-08-05 MED ORDER — HEPARIN (PORCINE) IN NACL 100-0.45 UNIT/ML-% IJ SOLN
2300.0000 [IU]/h | INTRAMUSCULAR | Status: DC
  Administered 2018-08-05 – 2018-08-07 (×5): 2300 [IU]/h via INTRAVENOUS
  Filled 2018-08-05 (×4): qty 250

## 2018-08-05 NOTE — Progress Notes (Signed)
ANTICOAGULATION CONSULT NOTE - Consult  Pharmacy Consult for heparin drip Indication: VTE treatment  No Known Allergies  Patient Measurements: Height: 6' (182.9 cm) Weight: 149 lb 4 oz (67.7 kg) IBW/kg (Calculated) : 77.6 Heparin Dosing Weight: TBW  Vital Signs: Temp: 99.1 F (37.3 C) (08/29 0409) Temp Source: Oral (08/29 0409) BP: 126/77 (08/29 0409) Pulse Rate: 115 (08/29 0409)  Labs: Recent Labs    08/03/18 0151  08/03/18 1548 08/03/18 2218 08/04/18 0748 08/05/18 0810  HGB 8.6*  --  7.8*  --  8.5* 7.3*  HCT 27.1*  --  25.1*  --  27.4* 23.2*  PLT 534*  --  485*  --  534* 497*  HEPARINUNFRC 0.29*   < >  --  0.43 0.41 0.16*  CREATININE 1.62*  --   --   --  1.70* 1.62*   < > = values in this interval not displayed.   Estimated Creatinine Clearance: 40.6 mL/min (A) (by C-G formula based on SCr of 1.62 mg/dL (H)).  Assessment: Pharmacy consulted to dose and monitor heparin infusion in this 72 year old male for VTE treatment. Patient has PMH significant for bladder cancer with lung metastases and right IJ VTE (July 2019). Patient was on enoxaparin 80 mg subcutaneously q12h PTA (last dose 07/12/2018 @ 0600) and developed right leg DVT despite being on therapeutic LMWH. Patient was converted to a heparin drip on admission in preparation for thoracentesis. Baseline aPTT 35 seconds.  Baseline labs (07/16/2018):  -Hgb 12.2 (however, Hgb was 9.1 on 07/21/18) -Plt 656 -INR 1.1 -APTT 35 seconds  08/05/2018  Heparin level = 0.16 (subtherapeutic) on 2100 units/hr; RN reports that IV site infiltrated yesterday ~14:00 so heparin was off for ~1 hour. However, this should not have an effect on this AM heparin level since >12 hrs.   Hgb decreased 7.3, low but stable Pltc WNL  No bleeding reported per d/w RN  Plan noted to transition to [place] on discharge if renal function improves  Goal of Therapy:  Heparin level 0.3-0.7 units/ml Monitor platelets by anticoagulation protocol: Yes    Plan:   Increase heparin drip to 2300 units/hr and recheck HL in 8 hours  Daily CBC,  daily Heparin   Monitor closely for any signs or symptoms of bleeding  F/u tx to fondaparinux once renal fxn improves and discharge plans determined  Peggyann Juba, PharmD, BCPS Pager: [phone] 08/05/2018 11:13 AM

## 2018-08-05 NOTE — Progress Notes (Signed)
ANTICOAGULATION CONSULT NOTE - Consult  Pharmacy Consult for heparin drip Indication: VTE treatment  No Known Allergies  Patient Measurements: Height: 6' (182.9 cm) Weight: 149 lb 4 oz (67.7 kg) IBW/kg (Calculated) : 77.6 Heparin Dosing Weight: TBW  Vital Signs: Temp: 99.3 F (37.4 C) (08/29 1543) Temp Source: Oral (08/29 1543) BP: 159/87 (08/29 1543) Pulse Rate: 120 (08/29 1543)  Labs: Recent Labs    08/03/18 0151  08/03/18 1548  08/04/18 0748 08/05/18 0810 08/05/18 1757  HGB 8.6*  --  7.8*  --  8.5* 7.3*  --   HCT 27.1*  --  25.1*  --  27.4* 23.2*  --   PLT 534*  --  485*  --  534* 497*  --   HEPARINUNFRC 0.29*   < >  --    < > 0.41 0.16* 0.46  CREATININE 1.62*  --   --   --  1.70* 1.62*  --    < > = values in this interval not displayed.   Estimated Creatinine Clearance: 40.6 mL/min (A) (by C-G formula based on SCr of 1.62 mg/dL (H)).  Assessment: Pharmacy consulted to dose and monitor heparin infusion in this 72 year old male for VTE treatment. Patient has PMH significant for bladder cancer with lung metastases and right IJ VTE (July 2019). Patient was on enoxaparin 80 mg subcutaneously q12h PTA (last dose 07/26/2018 @ 0600) and developed right leg DVT despite being on therapeutic LMWH. Patient was converted to a heparin drip on admission in preparation for thoracentesis. Baseline aPTT 35 seconds.  Baseline labs (07/20/2018):  -Hgb 12.2 (however, Hgb was 9.1 on 07/21/18) -Plt 656 -INR 1.1 -APTT 35 seconds  08/05/2018  Heparin level = 0.16 (subtherapeutic) on 2100 units/hr; RN reports that IV site infiltrated yesterday ~14:00 so heparin was off for ~1 hour. However, this should not have an effect on this AM heparin level since >12 hrs.   Hgb decreased 7.3, low but stable Pltc WNL  PM HL at 1757 is 0.46, therapeutic. No line or bleeding issues per RN   No bleeding reported per d/w RN  Plan noted to transition to [place] on discharge if renal function improves  Goal  of Therapy:  Heparin level 0.3-0.7 units/ml Monitor platelets by anticoagulation protocol: Yes   Plan:   Continue heparin drip at 2300 units/hr  Daily CBC,  daily Heparin   Monitor closely for any signs or symptoms of bleeding  F/u tx to fondaparinux once renal fxn improves and discharge plans determined  Royetta Asal, PharmD, BCPS Pager [phone] 08/05/2018 7:35 PM

## 2018-08-05 NOTE — Progress Notes (Signed)
PROGRESS NOTE    Jeremy Patrick  URK:[id number] DOB: 04-07-1946 DOA: 08/03/2018 PCP: [place], Betty G, MD    Brief Narrative:  72 y.o.malewith medical history significant of metastatic bladder cancer, HTN, Right IJ DVT (Dxed 06/23/18) on Lovenox, sadly has developed bilateral lower extremity DVT despite being compliant with Lovenox admitted on 08/04/2018.  Patient was started on heparin drip.  Oncology was consulted and recommended Arixtra upon discharge.  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   -Despite compliance with Lovenox patient still having acute DVTs.   -Patient been treated with IV heparin, oncology was consulted and recommended to discharge Arixtra.   -Echocardiogram shows 65 to 70% with mild pulmonary hypertension.   -Patient without hypoxemia ,-Had discontinued Megace given increased risk for blood clots. -Had been planning transition to [place] when renal function improves  Sepsis secondary to UTI -Urine culture grew Enterobacter aerogenes, patient treated with vancomycin and cefepime on 8/25.   -Fever have defervescence, patient was started on Bactrim, however developed with AKI.   -Sensitivities shows sensitive to cephalosporin. Have since transitioned to [place]  -WBC has shown improvement -Leukocytosis already known to be attributed to underlying cancer -recheck CBC in AM -Palliative Care  consulted  Malignant Pleural effusion -S/p thoracentesis with pathology positive for malignant cells -Oncology following -If fluid re-accumilates and pt symptomatic, anticipate therapeutic thoracentesis at that time  AKI on CKD stage III -Likely secondary to dehydration as well as renal failure related to recent Bactrim -Clinically dehydrated on exam with poor skin turgor, dry mucus membranes, increased thirst -Patient is continued on LR at 75cc/hr -Avoid nephrotoxic agents -Still clinically dehydrated. Will give 250cc LR bolus  Anemia of chronic disease from malignancy -No evidence of acute blood loss -Will repeat CBC in AM, plan to transfuse if hgb <8  Metastatic bladder cancer -Patient is followed by Dr. Irene Limbo as outpatient.   -Poor prognosis -Palliative Care consulted per above  Severe protein caloric malnutrition due to chronic disease -Continue nutritional supplements and Remeron to help with appetite.   -Megace stopped secondary to increased hypercoagulability  GERD -Will continue on Pepcid 20 mg twice daily -Stable at present  DVT prophylaxis: Heparin gtt Code Status: Full Family Communication: Pt in room, wife at bedside Disposition Plan: Uncertain at this time  Consultants:   Oncology  Palliative Care  Procedures:   US guided thoracentesis 8/23  Antimicrobials: Anti-infectives (From admission, onward)   Start     Dose/Rate Route Frequency Ordered Stop   08/03/18 1000  cefdinir (OMNICEF) capsule 300 mg     300 mg Oral Every 12 hours 08/03/18 0806     08/02/18 1500  vancomycin (VANCOCIN) IVPB 1000 mg/200 mL premix  Status:  Discontinued     1,000 mg 200 mL/hr over 60 Minutes Intravenous Every 24 hours 08/01/18 1431 08/02/18 1444   08/02/18 1500  sulfamethoxazole-trimethoprim (BACTRIM DS,SEPTRA DS) 800-160 MG per tablet 1 tablet  Status:  Discontinued  1 tablet Oral Every 12 hours 08/02/18 1444 08/03/18 0806   08/01/18 1445  vancomycin (VANCOCIN)  1,500 mg in sodium chloride 0.9 % 500 mL IVPB     1,500 mg 250 mL/hr over 120 Minutes Intravenous  Once 08/01/18 1431 08/01/18 1840   08/01/18 1430  ceFEPIme (MAXIPIME) 2 g in sodium chloride 0.9 % 100 mL IVPB  Status:  Discontinued     2 g 200 mL/hr over 30 Minutes Intravenous Every 12 hours 08/01/18 1410 08/02/18 1444   08/01/18 1415  vancomycin (VANCOCIN) IVPB 1000 mg/200 mL premix  Status:  Discontinued     1,000 mg 200 mL/hr over 60 Minutes Intravenous  Once 08/01/18 1412 08/01/18 1430      Subjective: No complaints at present  Objective: Vitals:   08/04/18 1100 08/04/18 1553 08/04/18 2039 08/05/18 0409  BP: 112/76 117/80 125/76 126/77  Pulse: (!) 118 (!) 122 (!) 113 (!) 115  Resp:  18 20 20   Temp:  99 F (37.2 C) 98.8 F (37.1 C) 99.1 F (37.3 C)  TempSrc:   Oral Oral  SpO2:  93% 91% 90%  Weight:      Height:        Intake/Output Summary (Last 24 hours) at 08/05/2018 1252 Last data filed at 08/05/2018 9675 Gross per 24 hour  Intake 1646.59 ml  Output 425 ml  Net 1221.59 ml   Filed Weights   08/07/2018 1707  Weight: 67.7 kg    Examination: General exam: Awake, laying in bed, in nad Respiratory system: Normal respiratory effort, no wheezing Cardiovascular system: tachycardic, s1, s2 Gastrointestinal system: Soft, nondistended, positive BS Central nervous system: CN2-12 grossly intact, strength intact Extremities: Perfused, no clubbing Skin: Normal skin turgor, no notable skin lesions seen Psychiatry: Mood normal // no visual hallucinations   Data Reviewed: I have personally reviewed following labs and imaging studies  CBC: Recent Labs  Lab 08/01/18 0443 08/02/18 0441 08/03/18 0151 08/03/18 1548 08/04/18 0748 08/05/18 0810  WBC 28.1* 36.3* 40.5* 33.6* 28.7* 27.6*  NEUTROABS 19.7*  --   --  26.9* 22.7 20.4*  HGB 7.9* 9.1* 8.6* 7.8* 8.5* 7.3*  HCT 25.7* 30.4* 27.1* 25.1* 27.4* 23.2*  MCV 86.8 88.6 85.8 85.1 86.2 84.1  PLT 571* 544* 534* 485* 534* 497*    Basic Metabolic Panel: Recent Labs  Lab 08/01/18 0443 08/02/18 0441 08/03/18 0151 08/04/18 0748 08/05/18 0810  NA 143 139 139 138 137  K 3.7 4.2 4.7 4.6 5.8*  CL 109 107 107 107 106  CO2 25 21* 22 20* 20*  GLUCOSE 135* 121* 111* 97 94  BUN 13 15 22  25* 27*  CREATININE 1.11 1.24 1.62* 1.70* 1.62*  CALCIUM 8.1* 8.3* 8.1* 8.1* 7.9*   GFR: Estimated Creatinine Clearance: 40.6 mL/min (A) (by C-G formula based on SCr of 1.62 mg/dL (H)). Liver Function Tests: Recent Labs  Lab 07/30/18 1717 08/01/18 0443  AST  --  77*  ALT  --  30  ALKPHOS  --  119  BILITOT  --  0.9  PROT 6.3* 5.9*  ALBUMIN  --  2.0*   No results for input(s): LIPASE, AMYLASE in the last 168 hours. No results for input(s): AMMONIA in the last 168 hours. Coagulation Profile: No results for input(s): INR, PROTIME in the last 168 hours. Cardiac Enzymes: No results for input(s): CKTOTAL, CKMB, CKMBINDEX, TROPONINI in the last 168 hours. BNP (last 3 results) No results for input(s): PROBNP in the last 8760 hours. HbA1C: No results for input(s):  HGBA1C in the last 72 hours. CBG: Recent Labs  Lab 07/30/18 1806  GLUCAP 113*   Lipid Profile: No results for input(s): CHOL, HDL, LDLCALC, TRIG, CHOLHDL, LDLDIRECT in the last 72 hours. Thyroid Function Tests: No results for input(s): TSH, T4TOTAL, FREET4, T3FREE, THYROIDAB in the last 72 hours. Anemia Panel: Recent Labs    08/03/18 0151 08/03/18 0641  VITAMINB12  --  872  FOLATE  --  6.2  FERRITIN  --  2,655*  TIBC  --  114*  IRON  --  17*  RETICCTPCT 1.2  --    Sepsis Labs: No results for input(s): PROCALCITON, LATICACIDVEN in the last 168 hours.  Recent Results (from the past 240 hour(s))  Culture, body fluid-bottle     Status: None   Collection Time: 07/30/18  4:57 PM  Result Value Ref Range Status   Specimen Description PLEURAL  Final   Special Requests FLUID  Final   Culture   Final    NO GROWTH 5 DAYS Performed at [place], [address] [place], [place] [address]    Report Status 08/04/2018 FINAL  Final  Culture, blood (Routine X 2) w Reflex to ID Panel     Status: None   Collection Time: 07/31/18  6:38 PM  Result Value Ref Range Status   Specimen Description   Final    BLOOD LEFT ANTECUBITAL Performed at [facility] [address], [place], [place] [address]    Special Requests   Final    BOTTLES DRAWN AEROBIC ONLY Blood Culture adequate volume Performed at [facility] [address] [place], [place] [address]    Culture   Final    NO GROWTH 5 DAYS Performed at [facility], [place] [address], [place], [place] [address]    Report Status 08/05/2018 FINAL  Final  Culture, blood (Routine X 2) w Reflex to ID Panel     Status: None   Collection Time: 07/31/18  6:38 PM  Result Value Ref Range Status   Specimen Description   Final    BLOOD BLOOD LEFT HAND Performed at [facility] [address] [place], [place] [address]    Special Requests   Final    BOTTLES DRAWN AEROBIC ONLY Blood Culture adequate volume Performed at [facility] [address], [place], [place] [address]    Culture   Final    NO GROWTH 5 DAYS Performed at [facility], [place] [address] [place], [place] [address]    Report Status 08/05/2018 FINAL  Final  Urine Culture     Status: Abnormal   Collection Time: 07/31/18  7:27 PM  Result Value Ref Range Status   Specimen Description   Final    URINE, CATHETERIZED Performed at [facility], [place] [address], [place], [place] [address]    Special Requests   Final    Immunocompromised Performed at [facility], [place] [address], [place], [place] [address]    Culture >=100,000 COLONIES/mL ENTEROBACTER AEROGENES (A)  Final   Report Status 08/03/2018 FINAL  Final   Organism ID, Bacteria ENTEROBACTER AEROGENES (A)  Final      Susceptibility   Enterobacter  aerogenes - MIC*    CEFAZOLIN >=64 RESISTANT Resistant     CEFTRIAXONE <=1 SENSITIVE Sensitive     CIPROFLOXACIN <=0.25 SENSITIVE Sensitive     GENTAMICIN <=1 SENSITIVE Sensitive     IMIPENEM 2 SENSITIVE  Sensitive     NITROFURANTOIN 128 RESISTANT Resistant     TRIMETH/SULFA <=20 SENSITIVE Sensitive     PIP/TAZO <=4 SENSITIVE Sensitive     * >=100,000 COLONIES/mL ENTEROBACTER AEROGENES     Radiology Studies: Dg Chest Port 1 View  Result Date: 08/03/2018 CLINICAL DATA:  History of pleural effusion, cough, follow-up, history of bladder carcinoma EXAM: PORTABLE CHEST 1 VIEW COMPARISON:  Chest x-ray of 08/01/2018, and CT chest of 04/26/2018 FINDINGS: There is little change in the right pleural effusion and right basilar opacities some which is due to atelectasis. Bilateral lung nodules and masses again are noted consistent with diffuse lung metastases. No left pleural effusion is seen. Cardiomegaly is stable. IMPRESSION: 1. Little change in volume of right pleural effusion with right basilar volume loss. 2. Stable lung nodules/masses consistent with diffuse lung metastases. Electronically Signed   By: Ivar Drape M.D.   On: 08/03/2018 16:30    Scheduled Meds: . cefdinir  300 mg Oral Q12H  . famotidine  20 mg Oral BID  . lactose free nutrition  237 mL Oral TID WC  . mirtazapine  15 mg Oral QHS  . multivitamin with minerals  1 tablet Oral Daily  . senna-docusate  2 tablet Oral QHS   Continuous Infusions: . heparin 2,300 Units/hr (08/05/18 0942)  . lactated ringers    . lactated ringers 75 mL/hr at 08/05/18 0510     LOS: 8 days   Marylu Lund, MD [facility] Pager [phone]  If 7PM-7AM, please contact night-coverage [email or web address] Password [facility] 08/05/2018, 12:52 PM

## 2018-08-05 NOTE — Progress Notes (Signed)
Patient ID: Jeremy Patrick, male   DOB: 06/13/1946, 72 y.o.   MRN: [id number]  This NP visited patient at the bedside as a follow up to  yesterday's [place] for continued conversation regarding goals of care and emotional support. Wife at  Bedside.  Patient is alert and oriented and denies pain or discomfort currently.  He is sitting up in bed eating breakfast.    He verbalizes no questions or concerns in regard to yesterday's detailed goals of care conversation specifically to diagnosis, prognosis and anticipatory increasing care needs.  He continues to verbalize his openness to all offered and available medical interventions to prolong life.  He does not give much thought the what if's of this serious complicated situation for himself and his family.  His wife and his children are concerned and plan to come from out of town today to talk with him about the next steps.  I shared with patient and his wife that I will not be in the hospital through the weekend but they can call the team phone to contact another provider with needs.    Questions and concerns addressed   Discussed with Dr Wyline Copas  Total time spent on the unit was 25 minutes  Greater than 50% of the time was spent in counseling and coordination of care  Wadie Lessen NP  Palliative Medicine Team Team Phone # [phone] Pager [phone]

## 2018-08-05 NOTE — Care Management Important Message (Signed)
Important Message  Patient Details  Name: Jeremy Patrick MRN: [id number] Date of Birth: 03/07/1946   Medicare Important Message Given:  Yes    Kerin Salen 08/05/2018, 10:49 AMImportant Message  Patient Details  Name: Jeremy Patrick MRN: [id number] Date of Birth: August 10, 1946   Medicare Important Message Given:  Yes    Kerin Salen 08/05/2018, 10:49 AM

## 2018-08-06 ENCOUNTER — Inpatient Hospital Stay (HOSPITAL_COMMUNITY): Payer: Medicare Other

## 2018-08-06 LAB — CBC WITH DIFFERENTIAL/PLATELET
Basophils Absolute: 0.2 10*3/uL — ABNORMAL HIGH (ref 0.0–0.1)
Basophils Relative: 1 %
Eosinophils Absolute: 0.6 10*3/uL (ref 0.0–0.7)
Eosinophils Relative: 3 %
HCT: 20.9 % — ABNORMAL LOW (ref 39.0–52.0)
Hemoglobin: 6.6 g/dL — CL (ref 13.0–17.0)
Lymphocytes Relative: 10 %
Lymphs Abs: 2 10*3/uL (ref 0.7–4.0)
MCH: 26.9 pg (ref 26.0–34.0)
MCHC: 31.6 g/dL (ref 30.0–36.0)
MCV: 85.3 fL (ref 78.0–100.0)
Monocytes Absolute: 2.4 10*3/uL — ABNORMAL HIGH (ref 0.1–1.0)
Monocytes Relative: 12 %
Neutro Abs: 15.2 10*3/uL — ABNORMAL HIGH (ref 1.7–7.7)
Neutrophils Relative %: 74 %
Platelets: 471 10*3/uL — ABNORMAL HIGH (ref 150–400)
RBC: 2.45 MIL/uL — ABNORMAL LOW (ref 4.22–5.81)
RDW: 19 % — ABNORMAL HIGH (ref 11.5–15.5)
WBC: 20.4 10*3/uL — ABNORMAL HIGH (ref 4.0–10.5)
nRBC: 2 /100 WBC — ABNORMAL HIGH

## 2018-08-06 LAB — BASIC METABOLIC PANEL
Anion gap: 8 (ref 5–15)
BUN: 24 mg/dL — ABNORMAL HIGH (ref 8–23)
CO2: 22 mmol/L (ref 22–32)
Calcium: 7.9 mg/dL — ABNORMAL LOW (ref 8.9–10.3)
Chloride: 108 mmol/L (ref 98–111)
Creatinine, Ser: 1.4 mg/dL — ABNORMAL HIGH (ref 0.61–1.24)
GFR calc Af Amer: 57 mL/min — ABNORMAL LOW (ref >60)
GFR calc non Af Amer: 49 mL/min — ABNORMAL LOW (ref >60)
Glucose, Bld: 122 mg/dL — ABNORMAL HIGH (ref 70–99)
Potassium: 4 mmol/L (ref 3.5–5.1)
Sodium: 138 mmol/L (ref 135–145)

## 2018-08-06 LAB — HEMOGLOBIN AND HEMATOCRIT, BLOOD
HCT: 30.8 % — ABNORMAL LOW (ref 39.0–52.0)
Hemoglobin: 9.6 g/dL — ABNORMAL LOW (ref 13.0–17.0)

## 2018-08-06 LAB — ABO/RH: ABO/RH(D): O POS

## 2018-08-06 LAB — PREPARE RBC (CROSSMATCH)

## 2018-08-06 LAB — HEPARIN LEVEL (UNFRACTIONATED): Heparin Unfractionated: 0.44 IU/mL (ref 0.30–0.70)

## 2018-08-06 MED ORDER — LORAZEPAM 2 MG/ML IJ SOLN
0.5000 mg | Freq: Four times a day (QID) | INTRAMUSCULAR | Status: DC | PRN
  Administered 2018-08-07 (×2): 0.5 mg via INTRAVENOUS
  Filled 2018-08-06 (×2): qty 1

## 2018-08-06 MED ORDER — HYDROMORPHONE HCL 1 MG/ML IJ SOLN
0.2000 mg | INTRAMUSCULAR | Status: DC | PRN
  Administered 2018-08-07: 0.4 mg via INTRAVENOUS
  Filled 2018-08-06: qty 0.5

## 2018-08-06 MED ORDER — LORAZEPAM 2 MG/ML IJ SOLN
0.5000 mg | Freq: Once | INTRAMUSCULAR | Status: AC
  Administered 2018-08-06: 0.5 mg via INTRAVENOUS
  Filled 2018-08-06: qty 1

## 2018-08-06 MED ORDER — SODIUM CHLORIDE 0.9% IV SOLUTION: Freq: Once | INTRAVENOUS | Status: DC

## 2018-08-06 MED ORDER — GLYCOPYRROLATE 0.2 MG/ML IJ SOLN
0.2000 mg | INTRAMUSCULAR | Status: DC | PRN
  Filled 2018-08-06: qty 1

## 2018-08-06 NOTE — Progress Notes (Signed)
Palliative Care progress note  For consult: Goals of care in light of metastatic disease with continued decline  Chart reviewed including nursing notes.  Discussed case with Dr. Wyline Copas as well as bedside care team.  Saw and examined [name] today.  He is noted to have a distinct decline in change from yesterday.  At this point, he will minimally engage with examiner but is no longer at a point where he is holding meaningful conversation.  He is not consistent with answers to yes or no questions.  At this time, he does not have capacity to make his own medical decisions.  I called and was able to reach his son via phone.  He reports that he had, and visited with his dad last night.  He states they had a very good visit and during the course of the visit they discussed the fact that [name] is likely approaching end of life.  His son reports that his father seem to be dealing with this rather well and indicated that he understood that he is dying.  Discussed his clinical course overnight as well as options moving forward for care.  These included:  full scope of treatment, continuation of current interventions for another 24 hours while limiting escalation of care while making medications available as needed for any signs of distress, and transitioning to full comfort care.  We discussed the risks and benefits of each of these options.  His son then asked for a couple hours to make phone calls to other family members to discuss care plan moving forward.  In the interim, patient's wife came to the hospital and I met with her as well.  She reports talking with his son earlier in discussing care plan for continuation of current interventions without escalation of care in the event of continued decompensation.  His wife is concerned that there are care activities that are necessary at this point which are causing him discomfort.  She is very specific about blood draws being something that she is opposed to  and something that caused him a great amount of distress.  She confirmed to me that she is looking to his son to help make medical decisions on his behalf.  She reports they have been married for a few years, but "his family has certainly known him much longer than I have and I want to give them the power to make decisions."  Able to call and reach his son again.  We discussed plan as follows:  -DNR/DNI with no escalation of care in the event of continued decompensation. -Current interventions overnight to see how he responds.  We discussed scheduled 5 AM lab draw and concern this causes discomfort.  If he does not transition to full comfort in the morning, we will need to get labs on him (on heparin drip).  We discussed plan for evaluation tomorrow morning (around 8 AM) to determine if he has declared himself or if drawing labs would add value to his care based upon clinical course overnight.  Cancelled AM labs until evaluated by myself or Dr. Wyline Copas in AM. -Ordered medications as needed for comfort.  These should not be held due to concerns for side effect, such as hypotension or respiratory depression, if they are needed to maintain his comfort.  Time: 1330- 1430; 1630-1700 Total time: 90 minutes Greater than 50%  of this time was spent counseling and coordinating care related to the above assessment and plan.  Micheline Rough, MD [facility] Palliative Medicine Team [phone]

## 2018-08-06 NOTE — Progress Notes (Signed)
This RN heard pt calling out of room. When this RN checked on pt he was anxious and agitated. PT was trying to get out of bed, was only alert to self, and was pulling at tubes and lines.The pts HR was elevated 120-140's and BP was 176/91. Pt was also SOB, 1 L per [place] was applied. O2 sat 94%. Pt had 10/10 pain. On call notified of pt condition.  Orders for ativan received. 0.5 mg ativan, 5 mg metoprolol, and 1 norco tab given to pt. Pt more calm now. Will continue to monitor pt.

## 2018-08-06 NOTE — Progress Notes (Addendum)
Nutrition Follow-up  DOCUMENTATION CODES:   Severe malnutrition in context of chronic illness  INTERVENTION:    Boost Plus chocolate TID- Each supplement provides 360kcal and 14g protein.    Magic cup TID with meals, each supplement provides 290 kcal and 9 grams of protein  NUTRITION DIAGNOSIS:   Severe Malnutrition related to chronic illness, cancer and cancer related treatments as evidenced by energy intake < or equal to 75% for > or equal to 1 month, percent weight loss, moderate fat depletion, severe fat depletion, moderate muscle depletion, severe muscle depletion.  Ongoing  GOAL:   Patient will meet greater than or equal to 90% of their needs  Progressing  MONITOR:   PO intake, Supplement acceptance, Weight trends, Labs  REASON FOR ASSESSMENT:   Consult Malnutrition Eval  ASSESSMENT:   Patient with PMH significant for metastatic breast cancer, HTN, and right IJ DVT. Admitted for bilateral DVT despite being on full dose anticoagulation elevated troponin cannot rule out PE at this time.    8/23- thoracentesis- 930 ml removed   Pt asleep at time of visit. RD attempted to wake pt multiple times, pt mumbled some words and fell back asleep multiple times. Spoke with tech regarding intake. Pt's appetite seems to be improving slightly. Meal completions charted as 25-75% for his last 4 meals which has been more than he's consumed all admission. He drinks Boost off and on per tech. Will continue with current interventions.   Palliative met with pt/family. Pt remains full code with full scope of treatment.   Weight shows to be stable since last RD visit.   No BM charted in 7 days. Recommend adding bowel regimen.   Medications reviewed and include: Remeron, MVI with minerals Labs reviewed.   Diet Order:   Diet Order            Diet regular Room service appropriate? Yes; Fluid consistency: Thin  Diet effective now              EDUCATION NEEDS:   Education needs  have been addressed  Skin:  Skin Assessment: Reviewed RN Assessment  Last BM:  07/30/18  Height:   Ht Readings from Last 1 Encounters:  08/05/18 6' (1.829 m)    Weight:   Wt Readings from Last 1 Encounters:  08/05/18 67.7 kg    Ideal Body Weight:  80.9 kg  BMI:  Body mass index is 20.24 kg/m.  Estimated Nutritional Needs:   Kcal:  8614-8307 kcal  Protein:  115-130 grams   Fluid:  >/= 2.3 L/day  Mariana Single RD, LDN Clinical Nutrition Pager # - [phone]

## 2018-08-06 NOTE — Progress Notes (Signed)
ANTICOAGULATION CONSULT NOTE - Follow Up Pharmacy Consult for heparin drip Indication: VTE treatment  No Known Allergies  Patient Measurements: Height: 6' (182.9 cm) Weight: 149 lb 4 oz (67.7 kg) IBW/kg (Calculated) : 77.6 Heparin Dosing Weight: TBW  Vital Signs: Temp: 98.9 F (37.2 C) (08/30 0545) Temp Source: Oral (08/29 2029) BP: 117/84 (08/30 0545) Pulse Rate: 93 (08/30 0545)  Labs: Recent Labs    08/04/18 0748 08/05/18 0810 08/05/18 1757 08/06/18 0427  HGB 8.5* 7.3*  --  6.6*  HCT 27.4* 23.2*  --  20.9*  PLT 534* 497*  --  471*  HEPARINUNFRC 0.41 0.16* 0.46 0.44  CREATININE 1.70* 1.62*  --  1.40*   Estimated Creatinine Clearance: 47 mL/min (A) (by C-G formula based on SCr of 1.4 mg/dL (H)).  Assessment: Pharmacy consulted to dose and monitor heparin infusion in this 72 year old male for VTE treatment. Patient has PMH significant for bladder cancer with lung metastases and right IJ VTE (July 2019). Patient was on enoxaparin 80 mg subcutaneously q12h PTA (last dose 07/25/2018 @ 0600) and developed right leg DVT despite being on therapeutic LMWH. Patient was converted to a heparin drip on admission in preparation for thoracentesis. Baseline aPTT 35 seconds.  Baseline labs (07/16/2018):  -Hgb 12.2 (however, Hgb was 9.1 on 07/21/18) -Plt 656 -INR 1.1 -APTT 35 seconds  08/06/2018  Heparin level now therapeutic x 2 on current IV heparin rate of 2300 units/hr  Hgb continues to decrease now 6.6 - per discussion with Md, patient getting blood now  No bleeding reported per d/w RN  Plan noted to transition to [place] on discharge if renal function improves  Goal of Therapy:  Heparin level 0.3-0.7 units/ml Monitor platelets by anticoagulation protocol: Yes   Plan:   Continue heparin drip at 2300 units/hr for now - per Md, continue IV heparin for now given patient's clot burden and pt getting blood today  Daily CBC,  daily Heparin   Monitor closely for any signs or  symptoms of bleeding  F/u tx to fondaparinux once renal fxn improves and discharge plans determined   Adrian Saran, PharmD, BCPS Pager [phone] 08/06/2018 8:13 AM

## 2018-08-06 NOTE — Progress Notes (Signed)
RN found pt lethergic and SOB when came onto shift. Pt is currently disorientated x4. MD made aware. Palliative care will be coming this afternoon to speak with patient and family. Pt currently stable at this time.

## 2018-08-06 NOTE — Progress Notes (Signed)
PROGRESS NOTE    Jeremy Patrick  EHM:[id number] DOB: Feb 07, 1946 DOA: 07/29/2018 PCP: [place], Betty G, MD    Brief Narrative:  72 y.o.malewith medical history significant of metastatic bladder cancer, HTN, Right IJ DVT (Dxed 06/23/18) on Lovenox, sadly has developed bilateral lower extremity DVT despite being compliant with Lovenox admitted on 07/14/2018.  Patient was started on heparin drip.  Oncology was consulted and recommended Arixtra upon discharge.  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   -Despite compliance with Lovenox patient still having acute DVTs.   -Patient been treated with IV heparin, oncology was consulted and recommended to discharge Arixtra.   -Echocardiogram shows 65 to 70% with mild pulmonary hypertension.   ,-Had discontinued Megace given increased risk for blood clots. -Had been planning transition to Arixtra when renal function improves.  Sepsis secondary to UTI -Urine culture grew Enterobacter aerogenes, patient treated with vancomycin and cefepime on 8/25.   -Fever have defervescence, patient was started on Bactrim, however developed with AKI.   -Sensitivities shows sensitive to cephalosporin. Have since transitioned to [place]  -WBC has shown improvement -Leukocytosis already known to be attributed to underlying cancer -Palliative Care consulted. See below  Malignant Pleural  effusion -S/p thoracentesis with pathology positive for malignant cells -Oncology following -Patient requiring more O2 today. CXR ordered and reviewed. 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 -one unit PRBC ordered, follow CBC  Metastatic bladder cancer -Patient is followed by Dr. Irene Limbo as outpatient.   -Poor prognosis -discussed case with Palliative Care  Severe protein caloric malnutrition due to chronic disease -Continue nutritional supplements and Remeron to help with appetite.   -Megace stopped secondary to increased hypercoagulability  GERD -Will continue on Pepcid 20 mg twice daily -Appears to be stable at present  DVT prophylaxis: Heparin gtt Code Status: Full Family Communication: Pt in room, wife at bedside Disposition Plan: Uncertain at this time  Consultants:   Oncology  Palliative Care  Procedures:   US guided thoracentesis 8/23  Antimicrobials: Anti-infectives (From admission, onward)   Start     Dose/Rate Route Frequency Ordered Stop   08/03/18 1000  cefdinir (OMNICEF) capsule 300 mg     300 mg Oral Every 12 hours 08/03/18 0806     08/02/18 1500  vancomycin (VANCOCIN) IVPB 1000 mg/200 mL premix  Status:  Discontinued     1,000 mg 200 mL/hr over 60 Minutes Intravenous Every 24 hours 08/01/18 1431 08/02/18 1444   08/02/18 1500  sulfamethoxazole-trimethoprim (BACTRIM DS,SEPTRA DS) 800-160 MG per tablet 1 tablet  Status:  Discontinued  1 tablet Oral Every 12 hours 08/02/18 1444 08/03/18 0806   08/01/18 1445  vancomycin (VANCOCIN) 1,500 mg in  sodium chloride 0.9 % 500 mL IVPB     1,500 mg 250 mL/hr over 120 Minutes Intravenous  Once 08/01/18 1431 08/01/18 1840   08/01/18 1430  ceFEPIme (MAXIPIME) 2 g in sodium chloride 0.9 % 100 mL IVPB  Status:  Discontinued     2 g 200 mL/hr over 30 Minutes Intravenous Every 12 hours 08/01/18 1410 08/02/18 1444   08/01/18 1415  vancomycin (VANCOCIN) IVPB 1000 mg/200 mL premix  Status:  Discontinued     1,000 mg 200 mL/hr over 60 Minutes Intravenous  Once 08/01/18 1412 08/01/18 1430      Subjective: Without complaints at this time  Objective: Vitals:   08/06/18 0824 08/06/18 0909 08/06/18 1114 08/06/18 1228  BP: 137/79 133/75 (!) 148/99 (!) 151/97  Pulse: 93 91 99 (!) 109  Resp: 20 20 20 16   Temp: 97.7 F (36.5 C) 97.7 F (36.5 C) 97.8 F (36.6 C) 98 F (36.7 C)  TempSrc: Oral Oral Oral Oral  SpO2: 99% 94% 97% 98%  Weight:      Height:        Intake/Output Summary (Last 24 hours) at 08/06/2018 1454 Last data filed at 08/06/2018 1335 Gross per 24 hour  Intake 3322.96 ml  Output 1200 ml  Net 2122.96 ml   Filed Weights   08/01/2018 1707 08/05/18 1543  Weight: 67.7 kg 67.7 kg    Examination: General exam: asleep, arousable, in no acute distress Respiratory system: normal chest rise, clear, no audible wheezing Cardiovascular system: regular rhythm, s1-s2 Gastrointestinal system: Nondistended, nontender, pos BS Central nervous system: No seizures, no tremors Extremities: No cyanosis, no joint deformities Skin: No rashes, no pallor Psychiatry: difficult to assess. Seems more confused  Data Reviewed: I have personally reviewed following labs and imaging studies  CBC: Recent Labs  Lab 08/01/18 0443  08/03/18 0151 08/03/18 1548 08/04/18 0748 08/05/18 0810 08/06/18 0427  WBC 28.1*   < > 40.5* 33.6* 28.7* 27.6* 20.4*  NEUTROABS 19.7*  --   --  26.9* 22.7 20.4* 15.2*  HGB 7.9*   < > 8.6* 7.8* 8.5* 7.3* 6.6*  HCT 25.7*   < > 27.1* 25.1* 27.4* 23.2* 20.9*  MCV 86.8   < >  85.8 85.1 86.2 84.1 85.3  PLT 571*   < > 534* 485* 534* 497* 471*   < > = values in this interval not displayed.   Basic Metabolic Panel: Recent Labs  Lab 08/02/18 0441 08/03/18 0151 08/04/18 0748 08/05/18 0810 08/06/18 0427  NA 139 139 138 137 138  K 4.2 4.7 4.6 5.8* 4.0  CL 107 107 107 106 108  CO2 21* 22 20* 20* 22  GLUCOSE 121* 111* 97 94 122*  BUN 15 22 25* 27* 24*  CREATININE 1.24 1.62* 1.70* 1.62* 1.40*  CALCIUM 8.3* 8.1* 8.1* 7.9* 7.9*   GFR: Estimated Creatinine Clearance: 47 mL/min (A) (by C-G formula based on SCr of 1.4 mg/dL (H)). Liver Function Tests: Recent Labs  Lab 07/30/18 1717 08/01/18 0443  AST  --  77*  ALT  --  30  ALKPHOS  --  119  BILITOT  --  0.9  PROT 6.3* 5.9*  ALBUMIN  --  2.0*   No results for input(s): LIPASE, AMYLASE in the last 168 hours. No results for input(s): AMMONIA in the last 168 hours. Coagulation Profile: No results for input(s): INR, PROTIME in  the last 168 hours. Cardiac Enzymes: No results for input(s): CKTOTAL, CKMB, CKMBINDEX, TROPONINI in the last 168 hours. BNP (last 3 results) No results for input(s): PROBNP in the last 8760 hours. HbA1C: No results for input(s): HGBA1C in the last 72 hours. CBG: Recent Labs  Lab 07/30/18 1806  GLUCAP 113*   Lipid Profile: No results for input(s): CHOL, HDL, LDLCALC, TRIG, CHOLHDL, LDLDIRECT in the last 72 hours. Thyroid Function Tests: No results for input(s): TSH, T4TOTAL, FREET4, T3FREE, THYROIDAB in the last 72 hours. Anemia Panel: No results for input(s): VITAMINB12, FOLATE, FERRITIN, TIBC, IRON, RETICCTPCT in the last 72 hours. Sepsis Labs: No results for input(s): PROCALCITON, LATICACIDVEN in the last 168 hours.  Recent Results (from the past 240 hour(s))  Culture, body fluid-bottle     Status: None   Collection Time: 07/30/18  4:57 PM  Result Value Ref Range Status   Specimen Description PLEURAL  Final   Special Requests FLUID  Final   Culture   Final    NO  GROWTH 5 DAYS Performed at [facility], [address], [place], [place] [address]    Report Status 08/04/2018 FINAL  Final  Culture, blood (Routine X 2) w Reflex to ID Panel     Status: None   Collection Time: 07/31/18  6:38 PM  Result Value Ref Range Status   Specimen Description   Final    BLOOD LEFT ANTECUBITAL Performed at [facility] [address] [place], [place] [address]    Special Requests   Final    BOTTLES DRAWN AEROBIC ONLY Blood Culture adequate volume Performed at [facility] [address], [place], [place] [address]    Culture   Final    NO GROWTH 5 DAYS Performed at [facility], [place] [address], [place], [place] [address]    Report Status 08/05/2018 FINAL  Final  Culture, blood (Routine X 2) w Reflex to ID Panel     Status: None   Collection Time: 07/31/18  6:38 PM  Result Value Ref Range Status   Specimen Description   Final    BLOOD BLOOD LEFT HAND Performed at [facility] [address], [place], [place] [address]    Special Requests   Final    BOTTLES DRAWN AEROBIC ONLY Blood Culture adequate volume Performed at [facility] [address], [place], [place] [address]    Culture   Final    NO GROWTH 5 DAYS Performed at [facility], [place] [address], [place], [place] [address]    Report Status 08/05/2018 FINAL  Final  Urine Culture     Status: Abnormal   Collection Time: 07/31/18  7:27 PM  Result Value Ref Range Status   Specimen Description   Final    URINE, CATHETERIZED Performed at [facility], [place] [address], [place], [place] [address]    Special Requests   Final    Immunocompromised Performed at [facility], [place] [address], [place], [place] [address]    Culture >=100,000 COLONIES/mL ENTEROBACTER AEROGENES (A)  Final   Report Status 08/03/2018 FINAL  Final   Organism ID, Bacteria ENTEROBACTER AEROGENES  (A)  Final      Susceptibility   Enterobacter aerogenes - MIC*    CEFAZOLIN >=64 RESISTANT Resistant     CEFTRIAXONE <=1 SENSITIVE Sensitive     CIPROFLOXACIN <=0.25 SENSITIVE Sensitive     GENTAMICIN <=1 SENSITIVE Sensitive  IMIPENEM 2 SENSITIVE Sensitive     NITROFURANTOIN 128 RESISTANT Resistant     TRIMETH/SULFA <=20 SENSITIVE Sensitive     PIP/TAZO <=4 SENSITIVE Sensitive     * >=100,000 COLONIES/mL ENTEROBACTER AEROGENES     Radiology Studies: Dg Chest Port 1 View  Result Date: 08/06/2018 CLINICAL DATA:  72 year old male with a history of venous thromboembolism and bladder cancer. Known lung metastases EXAM: PORTABLE CHEST 1 VIEW COMPARISON:  Chest x-ray 08/03/2018, PET CT 07/21/2018 FINDINGS: Cardiomediastinal silhouette likely unchanged with the right heart border partially obscured by overlying lung and pleural disease. Opacity at the right lung base persists partially obscuring the right hemidiaphragm and the right heart border. No significant change in left-sided lung aeration. Redemonstration of nodular opacities of the right and left lung, better characterized on recent PET-CT. No acute displaced fracture IMPRESSION: Unchanged appearance of the chest x-ray, with persisting right basilar opacity, likely a combination of known metastases, atelectasis/consolidation, and pleural effusion. Electronically Signed   By: Corrie Mckusick D.O.   On: 08/06/2018 13:10    Scheduled Meds: . sodium chloride   Intravenous Once  . cefdinir  300 mg Oral Q12H  . famotidine  20 mg Oral BID  . lactose free nutrition  237 mL Oral TID WC  . mirtazapine  15 mg Oral QHS  . multivitamin with minerals  1 tablet Oral Daily  . senna-docusate  2 tablet Oral QHS   Continuous Infusions: . heparin 2,300 Units/hr (08/06/18 1335)     LOS: 9 days   Marylu Lund, MD [facility] Pager [phone]  If 7PM-7AM, please contact night-coverage [email or web address] Password [email or web address] 08/06/2018, 2:54 PM

## 2018-08-06 NOTE — Progress Notes (Signed)
PT Cancellation Note  Patient Details Name: Jeremy Patrick MRN: [id number] DOB: [birth year]   Cancelled Treatment:    Reason Eval/Treat Not Completed: Medical issues which prohibited therapy(Hgb 6.6)   Trine Fread,KATHrine E 08/06/2018, 9:20 AM Carmelia Bake, PT, DPT 08/06/2018 Pager: [phone]

## 2018-08-07 MED ORDER — BIOTENE DRY MOUTH MT LIQD: 15.0000 mL | OROMUCOSAL | Status: DC | PRN

## 2018-08-07 MED ORDER — OXYCODONE HCL 20 MG/ML PO CONC: 10.0000 mg | ORAL | Status: DC | PRN

## 2018-08-07 MED ORDER — POLYVINYL ALCOHOL 1.4 % OP SOLN
1.0000 [drp] | Freq: Four times a day (QID) | OPHTHALMIC | Status: DC | PRN
  Filled 2018-08-07: qty 15

## 2018-08-07 MED ORDER — HALOPERIDOL LACTATE 5 MG/ML IJ SOLN
1.0000 mg | INTRAMUSCULAR | Status: DC | PRN
  Administered 2018-08-07 – 2018-08-08 (×6): 2 mg via INTRAVENOUS
  Filled 2018-08-07 (×6): qty 1

## 2018-08-07 MED ORDER — HALOPERIDOL LACTATE 5 MG/ML IJ SOLN
INTRAMUSCULAR | Status: AC
  Administered 2018-08-07: 02:00:00
  Filled 2018-08-07: qty 1

## 2018-08-07 MED ORDER — METOPROLOL TARTRATE 5 MG/5ML IV SOLN: 5.0000 mg | INTRAVENOUS | Status: DC | PRN

## 2018-08-07 MED ORDER — HALOPERIDOL 0.5 MG PO TABS
0.5000 mg | ORAL_TABLET | ORAL | Status: DC | PRN
  Filled 2018-08-07: qty 1

## 2018-08-07 MED ORDER — HALOPERIDOL LACTATE 5 MG/ML IJ SOLN
2.0000 mg | Freq: Once | INTRAMUSCULAR | Status: AC
  Administered 2018-08-07: 2 mg via INTRAVENOUS

## 2018-08-07 MED ORDER — HYDROMORPHONE HCL 1 MG/ML IJ SOLN
0.5000 mg | INTRAMUSCULAR | Status: DC | PRN
  Administered 2018-08-07 (×3): 1 mg via INTRAVENOUS
  Filled 2018-08-07 (×3): qty 1

## 2018-08-07 MED ORDER — HALOPERIDOL LACTATE 2 MG/ML PO CONC
1.0000 mg | ORAL | Status: DC | PRN
  Filled 2018-08-07: qty 1

## 2018-08-07 NOTE — Progress Notes (Signed)
PROGRESS NOTE    DOCTOR SHEAHAN  TGG:[id number] DOB: [birth year] DOA: 07/14/2018 PCP: [place], Betty G, MD    Brief Narrative:  72 y.o.malewith medical history significant of metastatic bladder cancer, HTN, Right IJ DVT (Dxed 06/23/18) on Lovenox, sadly has developed bilateral lower extremity DVT despite being compliant with Lovenox admitted on 07/25/2018.  Patient was started on heparin drip.  Oncology was consulted and recommended Arixtra upon discharge.  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   -Despite compliance with Lovenox patient still having acute DVTs.   -Patient been treated with IV heparin, oncology was consulted and recommended to discharge Arixtra.   -Echocardiogram shows 65 to 70% with mild pulmonary hypertension.   ,-Had earlier discontinued Megace given increased risk for blood clots. -Initial plans for transition to Arixtra pending normalization of renal function -As patient is progressively declining with plan for focus on comfort, heparin has been discontinued  Sepsis secondary to UTI -Urine culture grew Enterobacter aerogenes, patient treated with vancomycin and cefepime on 8/25.   -Fever have defervescence, patient was started on Bactrim, however developed with AKI.   -Sensitivities shows sensitive to cephalosporin. Patient had since transitioned to [facility] following. See  below  Malignant Pleural effusion -S/p thoracentesis with pathology positive for malignant cells -On minimal O2 at present -Recent CXR personally reviewed. Stable findings  AKI on CKD stage III -Likely secondary to dehydration as well as renal failure related to recent Bactrim -Recently noted to be clinically dehydrated, mucus membranes appear less dry following IVF hydration. -No further blood draws as focus is more on comfort  Anemia of chronic disease from malignancy -No evidence of acute blood loss -Hgb noted to be in the 6 range on 8/30 -Patient given one unit PRBC -No further blood draw given focus on comfort  Metastatic bladder cancer -Patient is followed by Dr. Irene Limbo as outpatient.   -Grim prognosis -discussed case with Palliative Care.Decision by family for focus on comfort  Severe protein caloric malnutrition due to chronic disease -Continue nutritional supplements and Remeron to help with appetite.   -Megace stopped secondary to increased hypercoagulability -No focusing more on comfort only  GERD -Appears stable -Focusing on comfort  DVT prophylaxis: comfort Code Status: Full Family Communication: Pt in room, wife at bedside Disposition Plan: Uncertain at this time  Consultants:   Oncology  Palliative Care  Procedures:   US guided thoracentesis 8/23  Antimicrobials: Anti-infectives (From admission, onward)   Start     Dose/Rate Route Frequency Ordered Stop   08/03/18 1000  cefdinir (OMNICEF) capsule 300 mg  Status:  Discontinued     300 mg Oral Every 12 hours 08/03/18 0806 08/07/18 0912   08/02/18 1500  vancomycin (VANCOCIN) IVPB 1000 mg/200 mL premix  Status:  Discontinued     1,000 mg 200 mL/hr over 60 Minutes Intravenous Every 24 hours 08/01/18 1431 08/02/18 1444   08/02/18 1500  sulfamethoxazole-trimethoprim (BACTRIM DS,SEPTRA DS) 800-160 MG  per tablet 1 tablet  Status:  Discontinued     1 tablet Oral Every 12 hours 08/02/18 1444 08/03/18  0806   08/01/18 1445  vancomycin (VANCOCIN) 1,500 mg in sodium chloride 0.9 % 500 mL IVPB     1,500 mg 250 mL/hr over 120 Minutes Intravenous  Once 08/01/18 1431 08/01/18 1840   08/01/18 1430  ceFEPIme (MAXIPIME) 2 g in sodium chloride 0.9 % 100 mL IVPB  Status:  Discontinued     2 g 200 mL/hr over 30 Minutes Intravenous Every 12 hours 08/01/18 1410 08/02/18 1444   08/01/18 1415  vancomycin (VANCOCIN) IVPB 1000 mg/200 mL premix  Status:  Discontinued     1,000 mg 200 mL/hr over 60 Minutes Intravenous  Once 08/01/18 1412 08/01/18 1430      Subjective: Unable to assess given confusion  Objective: Vitals:   08/06/18 2044 08/07/18 0025 08/07/18 0221 08/07/18 0336  BP: (!) 156/88   (!) 159/84  Pulse: (!) 120 (!) 139  (!) 115  Resp: 18   18  Temp: 99.9 F (37.7 C)   97.6 F (36.4 C)  TempSrc: Oral   Axillary  SpO2: 99% 94% 96% 97%  Weight:      Height:        Intake/Output Summary (Last 24 hours) at 08/07/2018 1220 Last data filed at 08/07/2018 1150 Gross per 24 hour  Intake 671.41 ml  Output 1050 ml  Net -378.59 ml   Filed Weights   07/09/2018 1707 08/05/18 1543  Weight: 67.7 kg 67.7 kg    Examination: General exam: confused, appears aggitated Respiratory system: normal chest rise, clear, no audible wheezing Cardiovascular system: regular rhythm, s1-s2 Gastrointestinal system: Nondistended, nontender, pos BS Central nervous system: No seizures, no tremors Extremities: No cyanosis, no joint deformities Skin: No rashes, no pallor Psychiatry: difficult to assess given confusion  Data Reviewed: I have personally reviewed following labs and imaging studies  CBC: Recent Labs  Lab 08/01/18 0443  08/03/18 0151 08/03/18 1548 08/04/18 0748 08/05/18 0810 08/06/18 0427 08/06/18 1341  WBC 28.1*   < > 40.5* 33.6* 28.7* 27.6* 20.4*  --   NEUTROABS 19.7*  --   --  26.9* 22.7 20.4* 15.2*  --   HGB 7.9*   < > 8.6* 7.8* 8.5* 7.3* 6.6* 9.6*  HCT 25.7*   < > 27.1* 25.1* 27.4*  23.2* 20.9* 30.8*  MCV 86.8   < > 85.8 85.1 86.2 84.1 85.3  --   PLT 571*   < > 534* 485* 534* 497* 471*  --    < > = values in this interval not displayed.   Basic Metabolic Panel: Recent Labs  Lab 08/02/18 0441 08/03/18 0151 08/04/18 0748 08/05/18 0810 08/06/18 0427  NA 139 139 138 137 138  K 4.2 4.7 4.6 5.8* 4.0  CL 107 107 107 106 108  CO2 21* 22 20* 20* 22  GLUCOSE 121* 111* 97 94 122*  BUN 15 22 25* 27* 24*  CREATININE 1.24 1.62* 1.70* 1.62* 1.40*  CALCIUM 8.3* 8.1* 8.1* 7.9* 7.9*   GFR: Estimated Creatinine Clearance: 47 mL/min (A) (by C-G formula based on SCr of 1.4 mg/dL (H)). Liver Function Tests: Recent Labs  Lab 08/01/18 0443  AST 77*  ALT 30  ALKPHOS 119  BILITOT 0.9  PROT 5.9*  ALBUMIN 2.0*   No results for input(s): LIPASE, AMYLASE in the last 168 hours. No results for input(s): AMMONIA in the last 168 hours. Coagulation Profile: No results for input(s): INR,  PROTIME in the last 168 hours. Cardiac Enzymes: No results for input(s): CKTOTAL, CKMB, CKMBINDEX, TROPONINI in the last 168 hours. BNP (last 3 results) No results for input(s): PROBNP in the last 8760 hours. HbA1C: No results for input(s): HGBA1C in the last 72 hours. CBG: No results for input(s): GLUCAP in the last 168 hours. Lipid Profile: No results for input(s): CHOL, HDL, LDLCALC, TRIG, CHOLHDL, LDLDIRECT in the last 72 hours. Thyroid Function Tests: No results for input(s): TSH, T4TOTAL, FREET4, T3FREE, THYROIDAB in the last 72 hours. Anemia Panel: No results for input(s): VITAMINB12, FOLATE, FERRITIN, TIBC, IRON, RETICCTPCT in the last 72 hours. Sepsis Labs: No results for input(s): PROCALCITON, LATICACIDVEN in the last 168 hours.  Recent Results (from the past 240 hour(s))  Culture, body fluid-bottle     Status: None   Collection Time: 07/30/18  4:57 PM  Result Value Ref Range Status   Specimen Description PLEURAL  Final   Special Requests FLUID  Final   Culture   Final    NO  GROWTH 5 DAYS Performed at [facility], [address] [place], [place] [address]    Report Status 08/04/2018 FINAL  Final  Culture, blood (Routine X 2) w Reflex to ID Panel     Status: None   Collection Time: 07/31/18  6:38 PM  Result Value Ref Range Status   Specimen Description   Final    BLOOD LEFT ANTECUBITAL Performed at [facility] [address], [place], [place] [address]    Special Requests   Final    BOTTLES DRAWN AEROBIC ONLY Blood Culture adequate volume Performed at [facility] [address], [place], [place] [address]    Culture   Final    NO GROWTH 5 DAYS Performed at [facility], [place] [address] [place], [place] [address]    Report Status 08/05/2018 FINAL  Final  Culture, blood (Routine X 2) w Reflex to ID Panel     Status: None   Collection Time: 07/31/18  6:38 PM  Result Value Ref Range Status   Specimen Description   Final    BLOOD BLOOD LEFT HAND Performed at [facility] [address] [place], [place] [address]    Special Requests   Final    BOTTLES DRAWN AEROBIC ONLY Blood Culture adequate volume Performed at [facility] [address] [place], [place] [address]    Culture   Final    NO GROWTH 5 DAYS Performed at [facility], [place] [address] [place], [place] [address]    Report Status 08/05/2018 FINAL  Final  Urine Culture     Status: Abnormal   Collection Time: 07/31/18  7:27 PM  Result Value Ref Range Status   Specimen Description   Final    URINE, CATHETERIZED Performed at [facility], [place] [address], [place], [place] [address]    Special Requests   Final    Immunocompromised Performed at [facility], [place] [address], [place], [place] [address]    Culture >=100,000 COLONIES/mL ENTEROBACTER AEROGENES (A)  Final   Report Status 08/03/2018 FINAL  Final   Organism ID, Bacteria ENTEROBACTER AEROGENES  (A)  Final      Susceptibility   Enterobacter aerogenes - MIC*    CEFAZOLIN >=64 RESISTANT Resistant     CEFTRIAXONE <=1 SENSITIVE Sensitive     CIPROFLOXACIN <=0.25 SENSITIVE Sensitive     GENTAMICIN <=1 SENSITIVE Sensitive  IMIPENEM 2 SENSITIVE Sensitive     NITROFURANTOIN 128 RESISTANT Resistant     TRIMETH/SULFA <=20 SENSITIVE Sensitive     PIP/TAZO <=4 SENSITIVE Sensitive     * >=100,000 COLONIES/mL ENTEROBACTER AEROGENES     Radiology Studies: Dg Chest Port 1 View  Result Date: 08/06/2018 CLINICAL DATA:  72 year old male with a history of venous thromboembolism and bladder cancer. Known lung metastases EXAM: PORTABLE CHEST 1 VIEW COMPARISON:  Chest x-ray 08/03/2018, PET CT 07/21/2018 FINDINGS: Cardiomediastinal silhouette likely unchanged with the right heart border partially obscured by overlying lung and pleural disease. Opacity at the right lung base persists partially obscuring the right hemidiaphragm and the right heart border. No significant change in left-sided lung aeration. Redemonstration of nodular opacities of the right and left lung, better characterized on recent PET-CT. No acute displaced fracture IMPRESSION: Unchanged appearance of the chest x-ray, with persisting right basilar opacity, likely a combination of known metastases, atelectasis/consolidation, and pleural effusion. Electronically Signed   By: Corrie Mckusick D.O.   On: 08/06/2018 13:10    Scheduled Meds: . mirtazapine  15 mg Oral QHS   Continuous Infusions:    LOS: 10 days   Marylu Lund, MD [facility] Pager [phone]  If 7PM-7AM, please contact night-coverage [email or web address] Password [email or web address] 08/07/2018, 12:20 PM

## 2018-08-07 NOTE — Progress Notes (Signed)
Palliative Care progress note  For consult: Goals of care in light of metastatic disease with continued decline  Chart reviewed including nursing notes.  Noted agitation overnight that does not appear to get better with ativan.  Discussed case with Dr. Wyline Copas as well as bedside care team.  Saw and examined Jeremy Patrick today.  His wife is at the bedside and reports "really rough night" last night.   At this point, he will open eyes at times, but is very agitated.  I called and was able to reach his son via phone.  We discussed overnight events and my concern that this is terminal agitation.  His son reports that he had discussed with his sister when he was called overnight to discuss restraints and they are in agreement with plan to transition to full comfort care.    We reviewed changes to regimen with goal of comfort.  All questions answered to the best of my ability.  -DNR/DNI.  FULL COMFORT CARE -For agitation: Seems to be having paradoxical effect to ativan.  Plan to increase haldol to 1-2mg  every hour as needed with goal of him being comfortable enough to remove restraints as soon as possible.  If requiring doses frequently (which I believe he will) will plan to schedule some haldol on regular basis. -Other comfort med orders placed per comfort care order set -His son will return to town tomorrow AM.  Will reevaluate for possible stability to consider transfer to residential hospice at that time, but anticipate that this will likely be a hospital death.  Total time: 50 minutes Greater than 50%  of this time was spent counseling and coordinating care related to the above assessment and plan.  Micheline Rough, MD [facility] [phone]

## 2018-08-07 NOTE — Progress Notes (Signed)
Pt very agitated, restless, and combative tonight. Pt attempting to pull lines, tele, kick/hit staff, get OOB. Writer gave pt PRN ativan to see if it works. Writer spoke with pt son [name] who is agreeable for pt to be placed in restraints if need be. Pt tachypneic, HR 140's, O2 95% RA, pt refusing to put oxygen on to help with breathing. IV wrapped in Kerlix to decrease pt from dislodgling IV's. Pt may benefit from scheduled ativan instead of PRN. Dr on call was paged. Awaiting call back or new orders. Pt wife at bedside.

## 2018-08-07 NOTE — Progress Notes (Signed)
Safety sitter at bedside, pt has been sleeping comfortably, pt last medicated with Haldol 2mg  and Dilaudid @ 1230 then additional dose of Dilaudid @ 1530. Pt restraints removed, wife at bedside, resting without distress. VSs and noted. SRP, RN

## 2018-08-07 NOTE — Progress Notes (Signed)
AC Supervisor notified by Tressa Busman. Pt currently in wrist and ankle restraints, but still attempting to sit up an dpulls O2 off. Pt would benefit from a Air cabin crew. AC attempting to find safety sitter to sit with pt. Pt wife currently at bedside with pt attempting to calm the pt, but it is not working.

## 2018-08-07 NOTE — Progress Notes (Addendum)
Pt doesn't seem to have relaxed with PRN Ativan and one time dose of Haldol. Pt currently combative with staff, attempting to dislodge iv/tele, condom cath. Pt offered potty and hydration but still attempting to get up . Writer paged Dr. Kennon Holter. Awaiting call back or orders.

## 2018-08-08 DIAGNOSIS — Z7189 Other specified counseling: Secondary | ICD-10-CM

## 2018-08-08 MED ORDER — SODIUM CHLORIDE 0.9 % IV SOLN
INTRAVENOUS | Status: DC | PRN
  Administered 2018-08-08: 250 mL via INTRAVENOUS

## 2018-08-08 MED ORDER — DIPHENHYDRAMINE HCL 12.5 MG/5ML PO ELIX: 12.5000 mg | ORAL_SOLUTION | Freq: Four times a day (QID) | ORAL | Status: DC | PRN

## 2018-08-08 MED ORDER — HYDROMORPHONE BOLUS VIA INFUSION
0.5000 mg | INTRAVENOUS | Status: DC | PRN
  Filled 2018-08-08: qty 1

## 2018-08-08 MED ORDER — HYDROMORPHONE 1 MG/ML IV SOLN: INTRAVENOUS | Status: DC

## 2018-08-08 MED ORDER — SODIUM CHLORIDE 0.9% FLUSH: 9.0000 mL | INTRAVENOUS | Status: DC | PRN

## 2018-08-08 MED ORDER — DIPHENHYDRAMINE HCL 50 MG/ML IJ SOLN: 12.5000 mg | Freq: Four times a day (QID) | INTRAMUSCULAR | Status: DC | PRN

## 2018-08-08 MED ORDER — NALOXONE HCL 0.4 MG/ML IJ SOLN: 0.4000 mg | INTRAMUSCULAR | Status: DC | PRN

## 2018-08-08 MED ORDER — SODIUM CHLORIDE 0.9 % IV SOLN
1.0000 mg/h | INTRAVENOUS | Status: DC
  Administered 2018-08-08: 1 mg/h via INTRAVENOUS
  Filled 2018-08-08 (×2): qty 2.5

## 2018-08-08 DEATH — deceased

## 2018-08-09 LAB — TYPE AND SCREEN
ABO/RH(D): O POS
Antibody Screen: NEGATIVE
Unit division: 0

## 2018-08-09 LAB — BPAM RBC
Blood Product Expiration Date: 201910012359
ISSUE DATE / TIME: 201908300834
Unit Type and Rh: 5100

## 2018-08-11 ENCOUNTER — Other Ambulatory Visit: Payer: Medicare Other

## 2018-08-11 ENCOUNTER — Ambulatory Visit: Payer: Medicare Other | Admitting: Hematology

## 2018-08-11 ENCOUNTER — Ambulatory Visit: Payer: Medicare Other

## 2018-08-18 ENCOUNTER — Ambulatory Visit: Payer: Medicare Other

## 2018-08-18 ENCOUNTER — Other Ambulatory Visit: Payer: Medicare Other

## 2018-08-19 ENCOUNTER — Ambulatory Visit: Payer: Medicare Other

## 2018-09-01 ENCOUNTER — Ambulatory Visit: Payer: Medicare Other | Admitting: Hematology

## 2018-09-01 ENCOUNTER — Other Ambulatory Visit: Payer: Medicare Other

## 2018-09-01 ENCOUNTER — Ambulatory Visit: Payer: Medicare Other

## 2018-09-07 NOTE — Progress Notes (Signed)
PROGRESS NOTE    Jeremy Patrick  IDP:[id number] DOB: 1946/05/11 DOA: 07/18/2018 PCP: [place], Betty G, MD    Brief Narrative:  72 y.o.malewith medical history significant of metastatic bladder cancer, HTN, Right IJ DVT (Dxed 06/23/18) on Lovenox, sadly has developed bilateral lower extremity DVT despite being compliant with Lovenox admitted on 07/22/2018.  Patient was started on heparin drip.  Oncology was consulted and recommended Arixtra upon discharge.  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   -Despite compliance with Lovenox patient still having acute DVTs.   -Patient been treated with IV heparin, oncology was consulted and recommended to discharge Arixtra.   -Echocardiogram shows 65 to 70% with mild pulmonary hypertension.   ,-Had earlier discontinued Megace given increased risk for blood clots. -Initial plans for transition to Arixtra pending normalization of renal function -With progressive decline, heparin now on hold  Sepsis secondary to UTI -Urine culture grew Enterobacter aerogenes, patient treated with vancomycin and cefepime on 8/25.   -Fever have defervescence, patient was started on Bactrim, however developed with AKI.   -Sensitivities shows sensitive to cephalosporin. Was given Omnicef -Now focus on comfort care  Malignant Pleural effusion -S/p thoracentesis with pathology positive for  malignant cells -Continue on O2 as needed for comfort  AKI on CKD stage III -Likely secondary to dehydration as well as renal failure related to recent Bactrim -Recently noted to be clinically dehydrated, mucus membranes appear less dry following IVF hydration. -Hold off on any further blood draws given focus on comfort  Anemia of chronic disease from malignancy -No evidence of acute blood loss -Hgb noted to be in the 6 range on 8/30 -Patient given one unit PRBC -Hold off on further blood draws  Metastatic bladder cancer -Patient is followed by Dr. Irene Limbo as outpatient.   -Overall grim prognosis -Continue Comfort Care per Palliative Care  Severe protein caloric malnutrition due to chronic disease -Continue nutritional supplements and Remeron to help with appetite.   -Megace stopped secondary to increased hypercoagulability -continue to focus on comfort  GERD -Appears stable -Comfort care  DVT prophylaxis: comfort Code Status: Full Family Communication: Pt in room, wife at bedside Disposition Plan: Uncertain at this time  Consultants:   Oncology  Palliative Care  Procedures:   US guided thoracentesis 8/23  Antimicrobials: Anti-infectives (From admission, onward)   Start     Dose/Rate Route Frequency Ordered Stop   08/03/18 1000  cefdinir (OMNICEF) capsule 300 mg  Status:  Discontinued     300 mg Oral Every 12 hours 08/03/18 0806 08/07/18 0912   08/02/18 1500  vancomycin (VANCOCIN) IVPB 1000 mg/200 mL premix  Status:  Discontinued     1,000 mg 200 mL/hr over 60 Minutes Intravenous Every 24 hours 08/01/18 1431 08/02/18 1444   08/02/18 1500  sulfamethoxazole-trimethoprim (BACTRIM DS,SEPTRA DS) 800-160 MG per tablet 1 tablet  Status:  Discontinued     1 tablet Oral Every 12 hours 08/02/18 1444 08/03/18 0806  08/01/18 1445  vancomycin (VANCOCIN) 1,500 mg in sodium chloride 0.9 % 500 mL IVPB     1,500 mg 250 mL/hr over 120 Minutes Intravenous  Once 08/01/18 1431  08/01/18 1840   08/01/18 1430  ceFEPIme (MAXIPIME) 2 g in sodium chloride 0.9 % 100 mL IVPB  Status:  Discontinued     2 g 200 mL/hr over 30 Minutes Intravenous Every 12 hours 08/01/18 1410 08/02/18 1444   08/01/18 1415  vancomycin (VANCOCIN) IVPB 1000 mg/200 mL premix  Status:  Discontinued     1,000 mg 200 mL/hr over 60 Minutes Intravenous  Once 08/01/18 1412 08/01/18 1430      Subjective: Cannot obtain secondary to lethargy  Objective: Vitals:   08/07/18 0025 08/07/18 0221 08/07/18 0336 08/07/18 1739  BP:   (!) 159/84 (!) 149/92  Pulse: (!) 139  (!) 115 (!) 107  Resp:   18 20  Temp:   97.6 F (36.4 C) 97.9 F (36.6 C)  TempSrc:   Axillary Axillary  SpO2: 94% 96% 97% 100%  Weight:      Height:        Intake/Output Summary (Last 24 hours) at 2018-08-13 1405 Last data filed at 13-Aug-2018 0630 Gross per 24 hour  Intake 120.48 ml  Output 325 ml  Net -204.52 ml   Filed Weights   07/21/2018 1707 08/05/18 1543  Weight: 67.7 kg 67.7 kg    Examination: General exam: Conversant, in no acute distress Respiratory system: normal chest rise, clear, no audible wheezing Cardiovascular system: regular rhythm, s1-s2  Data Reviewed: I have personally reviewed following labs and imaging studies  CBC: Recent Labs  Lab 08/03/18 0151 08/03/18 1548 08/04/18 0748 08/05/18 0810 08/06/18 0427 08/06/18 1341  WBC 40.5* 33.6* 28.7* 27.6* 20.4*  --   NEUTROABS  --  26.9* 22.7 20.4* 15.2*  --   HGB 8.6* 7.8* 8.5* 7.3* 6.6* 9.6*  HCT 27.1* 25.1* 27.4* 23.2* 20.9* 30.8*  MCV 85.8 85.1 86.2 84.1 85.3  --   PLT 534* 485* 534* 497* 471*  --    Basic Metabolic Panel: Recent Labs  Lab 08/02/18 0441 08/03/18 0151 08/04/18 0748 08/05/18 0810 08/06/18 0427  NA 139 139 138 137 138  K 4.2 4.7 4.6 5.8* 4.0  CL 107 107 107 106 108  CO2 21* 22 20* 20* 22  GLUCOSE 121* 111* 97 94 122*  BUN 15 22 25* 27* 24*  CREATININE 1.24 1.62* 1.70* 1.62* 1.40*  CALCIUM 8.3* 8.1* 8.1* 7.9* 7.9*    GFR: Estimated Creatinine Clearance: 47 mL/min (A) (by C-G formula based on SCr of 1.4 mg/dL (H)). Liver Function Tests: No results for input(s): AST, ALT, ALKPHOS, BILITOT, PROT, ALBUMIN in the last 168 hours. No results for input(s): LIPASE, AMYLASE in the last 168 hours. No results for input(s): AMMONIA in the last 168 hours. Coagulation Profile: No results for input(s): INR, PROTIME in the last 168 hours. Cardiac Enzymes: No results for input(s): CKTOTAL, CKMB, CKMBINDEX, TROPONINI in the last 168 hours. BNP (last 3 results) No results for input(s): PROBNP in the last 8760 hours. HbA1C: No results for input(s): HGBA1C in the last 72 hours. CBG: No results for input(s): GLUCAP in the last 168 hours. Lipid Profile: No results for input(s): CHOL, HDL, LDLCALC, TRIG, CHOLHDL, LDLDIRECT in the last 72 hours. Thyroid Function Tests: No results for input(s): TSH, T4TOTAL, FREET4, T3FREE, THYROIDAB in the last 72 hours. Anemia Panel: No results for input(s): VITAMINB12, FOLATE, FERRITIN, TIBC, IRON, RETICCTPCT in the last  72 hours. Sepsis Labs: No results for input(s): PROCALCITON, LATICACIDVEN in the last 168 hours.  Recent Results (from the past 240 hour(s))  Culture, body fluid-bottle     Status: None   Collection Time: 07/30/18  4:57 PM  Result Value Ref Range Status   Specimen Description PLEURAL  Final   Special Requests FLUID  Final   Culture   Final    NO GROWTH 5 DAYS Performed at [facility], [address] [place], [place] [address]    Report Status 08/04/2018 FINAL  Final  Culture, blood (Routine X 2) w Reflex to ID Panel     Status: None   Collection Time: 07/31/18  6:38 PM  Result Value Ref Range Status   Specimen Description   Final    BLOOD LEFT ANTECUBITAL Performed at [facility] [address] [place], [place] [address]    Special Requests   Final    BOTTLES DRAWN AEROBIC ONLY Blood Culture adequate volume Performed at  [facility] [address] [place], [place] [address]    Culture   Final    NO GROWTH 5 DAYS Performed at [facility], [place] [address] [place], [place] [address]    Report Status 08/05/2018 FINAL  Final  Culture, blood (Routine X 2) w Reflex to ID Panel     Status: None   Collection Time: 07/31/18  6:38 PM  Result Value Ref Range Status   Specimen Description   Final    BLOOD BLOOD LEFT HAND Performed at [facility] [address], [place], [place] [address]    Special Requests   Final    BOTTLES DRAWN AEROBIC ONLY Blood Culture adequate volume Performed at [facility] [address], [place], [place] [address]    Culture   Final    NO GROWTH 5 DAYS Performed at [facility], [place] [address], [place], [place] [address]    Report Status 08/05/2018 FINAL  Final  Urine Culture     Status: Abnormal   Collection Time: 07/31/18  7:27 PM  Result Value Ref Range Status   Specimen Description   Final    URINE, CATHETERIZED Performed at [facility], [place] [address], [place], [place] [address]    Special Requests   Final    Immunocompromised Performed at [facility], [place] [address] [place], [place] [address]    Culture >=100,000 COLONIES/mL ENTEROBACTER AEROGENES (A)  Final   Report Status 08/03/2018 FINAL  Final   Organism ID, Bacteria ENTEROBACTER AEROGENES (A)  Final      Susceptibility   Enterobacter aerogenes - MIC*    CEFAZOLIN >=64 RESISTANT Resistant     CEFTRIAXONE <=1 SENSITIVE Sensitive     CIPROFLOXACIN <=0.25 SENSITIVE Sensitive     GENTAMICIN <=1 SENSITIVE Sensitive     IMIPENEM 2 SENSITIVE Sensitive     NITROFURANTOIN 128 RESISTANT Resistant     TRIMETH/SULFA <=20 SENSITIVE Sensitive     PIP/TAZO <=4 SENSITIVE Sensitive     * >=100,000 COLONIES/mL ENTEROBACTER AEROGENES     Radiology Studies: No results found.  Scheduled Meds: . mirtazapine  15  mg Oral QHS   Continuous Infusions: . sodium chloride 20 mL/hr at 08/11/2018 0605  . HYDROmorphone 1 mg/hr (08-11-2018 0605)     LOS: 11 days   Marylu Lund, MD [facility] Pager [phone]  If 7PM-7AM, please contact night-coverage [email or web address] Password [email or web address] 2018-08-11, 2:05  PM    

## 2018-09-07 NOTE — Discharge Summary (Signed)
Death Summary  Jeremy Patrick IDP:[id number] DOB: 1946-04-26 DOA: 08-19-18  PCP: Martinique, Betty G, MD  Admit date: 2018/08/19 Date of Death: 08/30/2018 Time of Death: 03-10-1523 Notification: Martinique, Betty G, MD notified of death of 08-30-18   History of present illness:  Please see admit H and P. Briefly, patient was a 72 y.o.malewith medical history significant of metastatic bladder cancer, HTN, Right IJ DVT (Dxed 06/23/18) on Lovenox, sadly has developed bilateral lower extremity DVT despite being compliant with Lovenox admitted on 19-Aug-2018.Patient was started on heparin drip. Oncology was consulted and recommended Arixtra upon discharge. During hospital stay patient has been spiking fevers and white count continued to rise.   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  -Despite compliance with Lovenox patient still having acute DVTs.  -Patient been treated with IV heparin, oncology was consulted and recommended to discharge Arixtra.  -Echocardiogram shows 65 to 70% with mild pulmonary hypertension.  ,-Had earlier discontinued Megace given increased risk for blood clots. -Initial plans for transition to Arixtra pending normalization of renal function -With progressive decline, heparin stopped and patient placed on comfort measures per family wishes  Sepsis secondary to UTI -Urine culturegrewEnterobacter aerogenes, patienttreated withvancomycin and cefepime on 8/25.  -Fever have defervescence,patient was started on Bactrim, however developed with AKI. -Sensitivities showssensitive to cephalosporin. Was given Omnicef -Patient transitioned to comfort measures  Malignant Pleural effusion -S/p thoracentesis with pathology positive for malignant cells  AKI on CKD stage III -Likely secondary to dehydration as well as renal failure related to recent Bactrim -Recently noted to be clinically dehydrated, mucus  membranes appear less dry following IVF hydration. -No further blood draws done given comfort care status  Anemia of chronic disease from malignancy -No evidence of acute blood loss -Hgb noted to be in the 6 range on 8/30 -Patient given one unit PRBC  Metastatic bladder cancer -Patient is followed by Dr. Irene Limbo as outpatient.  -Overall grim prognosis -Placed on comfort measures  Severe protein caloric malnutrition due to chronic disease -Continue nutritional supplements and Remeron to help with appetite. -Megace stopped secondary to increased hypercoagulability -placed on comfort measures  GERD  End of Life -While on comfort measures, patient noted to be pulseless with no spontaneous respirations. Patient pronounced at 1524 on August 30, 2018.   The results of significant diagnostics from this hospitalization (including imaging, microbiology, ancillary and laboratory) are listed below for reference.    Significant Diagnostic Studies: Dg Chest 1 View  Result Date: 07/30/2018 CLINICAL DATA:  Post thoracentesis.  Metastatic disease. EXAM: CHEST  1 VIEW COMPARISON:  Ultrasound 07/30/2018.  Chest x-ray 12/27/2017. FINDINGS: Cardiomegaly. Bilateral pulmonary mass lesions again noted. Diminished right-sided pleural effusion post thoracentesis. No evidence of pneumothorax. IMPRESSION: 1.  No evidence of pneumothorax post thoracentesis. 2.  Multiple bilateral pulmonary mass lesions again noted. Electronically Signed   By: Marcello Moores  Register   On: 07/30/2018 17:04   Dg Chest 2 View  Result Date: 08/01/2018 CLINICAL DATA:  Fever, history of metastatic bladder cancer. EXAM: CHEST - 2 VIEW COMPARISON:  Radiograph of July 30, 2018. FINDINGS: Stable cardiomediastinal silhouette. No pneumothorax is noted. Bilateral lung masses and nodules are noted consistent with metastatic disease. Largest mass appears to be in the right lung base. Right basilar atelectasis and pleural effusion is noted. Bony thorax  is unremarkable. IMPRESSION: Bilateral pulmonary metastatic disease is again noted. Right basilar atelectasis and pleural effusion is noted. Electronically Signed   By: Marijo Conception, M.D.  On: 08/01/2018 12:21   Dg Chest 2 View  Result Date: 07/14/2018 CLINICAL DATA:  Metastatic bladder CA EXAM: CHEST - 2 VIEW COMPARISON:  07/21/2018 FINDINGS: Cardiac shadow is stable. Multiple too numerous to number pulmonary metastatic lesions are identified. Associated right-sided pleural effusion and right lower lobe consolidation is seen similar to that noted on prior exam IMPRESSION: Changes consistent with known pulmonary metastatic disease. The overall appearance is similar to that seen on recent PET-CT. Electronically Signed   By: Inez Catalina M.D.   On: 07/09/2018 17:47   Nm Pet Image Restag (ps) Skull Base To Thigh  Result Date: 07/21/2018 CLINICAL DATA:  Small cell lung cancer treatment strategy for metastatic bladder cancer. EXAM: NUCLEAR MEDICINE PET SKULL BASE TO THIGH TECHNIQUE: 7.7 mCi F-18 FDG was injected intravenously. Full-ring PET imaging was performed from the skull base to thigh after the radiotracer. CT data was obtained and used for attenuation correction and anatomic localization. Fasting blood glucose: 150 mg/dl COMPARISON:  None. FINDINGS: Mediastinal blood pool activity: SUV max 2.7 NECK: No hypermetabolic lymph nodes in the neck. Incidental CT findings: none CHEST: Multiple hypermetabolic nodule pleural metastasis in the RIGHT hemithorax again demonstrated. The overall involvement of the pleural space extending into the RIGHT hilum and RIGHT subcarinal nodal station appears increased compared to prior. Example activity subcarinal lymph nodes are increased in volume with SUV max equal 7.8 compared to SUV max equal 5.5. Two round masses in the RIGHT upper lobe have decreased metabolic activity and mild increase in size. For example RIGHT upper lobe mass measuring 3.5 cm SUV max equal 13.5  compares 3.2 cm mass with SUV max equal 21. RIGHT lower lobe mass measures 4.6 cm increased from 3.8 cm. Metabolic activity is more peripheral suggesting necrosis. RIGHT middle lobe mass measuring 4.7 cm compared to 4.4 cm. SUV max equal 13.7 compared 18.8. The pleural metastasis is slightly decreased in metabolic with activity SUV max equal 9.3 compared to SUV max equal 13. Pleural mass in the posterior RIGHT lung base measures 30 mm in thickness compared to 20 mm. Hypermetabolic nodules in the LEFT lung are similar with. For example LEFT lower lobe nodule measuring 2.1 cm compares to 2.1 cm with SUV max equal 6.9 compared to 6.3. Incidental CT findings: Choose one ABDOMEN/PELVIS: No abnormal hypermetabolic activity within the liver, pancreas, adrenal glands, or spleen. No hypermetabolic lymph nodes in the abdomen or pelvis. Incidental CT findings: none SKELETON: No focal hypermetabolic activity to suggest skeletal metastasis. Incidental CT findings: none IMPRESSION: 1. Overall evidence of progression of extensive pleural and parenchymal metastasis in the RIGHT hemithorax. 2. The large hypermetabolic RIGHT lung masses are decreased mildly in metabolic activity. However this size of lesions are increased. This finding could be seen with immunotherapy. 3. Likewise there is evidence of increase in size of pleural metastasis with increased extension into the subcarinal nodal station although again the metabolic activity is slightly decreased. Activity of these lesions me remains intense consistent with active malignancy. 4. Hypermetabolic metastatic pulmonary nodules in LEFT lung are stable. Electronically Signed   By: Suzy Bouchard M.D.   On: 07/21/2018 11:57   Dg Chest Port 1 View  Result Date: 08/06/2018 CLINICAL DATA:  72 year old male with a history of venous thromboembolism and bladder cancer. Known lung metastases EXAM: PORTABLE CHEST 1 VIEW COMPARISON:  Chest x-ray 08/03/2018, PET CT 07/21/2018 FINDINGS:  Cardiomediastinal silhouette likely unchanged with the right heart border partially obscured by overlying lung and pleural disease. Opacity at the  right lung base persists partially obscuring the right hemidiaphragm and the right heart border. No significant change in left-sided lung aeration. Redemonstration of nodular opacities of the right and left lung, better characterized on recent PET-CT. No acute displaced fracture IMPRESSION: Unchanged appearance of the chest x-ray, with persisting right basilar opacity, likely a combination of known metastases, atelectasis/consolidation, and pleural effusion. Electronically Signed   By: Corrie Mckusick D.O.   On: 08/06/2018 13:10   Dg Chest Port 1 View  Result Date: 08/03/2018 CLINICAL DATA:  History of pleural effusion, cough, follow-up, history of bladder carcinoma EXAM: PORTABLE CHEST 1 VIEW COMPARISON:  Chest x-ray of 08/01/2018, and CT chest of 04/26/2018 FINDINGS: There is little change in the right pleural effusion and right basilar opacities some which is due to atelectasis. Bilateral lung nodules and masses again are noted consistent with diffuse lung metastases. No left pleural effusion is seen. Cardiomegaly is stable. IMPRESSION: 1. Little change in volume of right pleural effusion with right basilar volume loss. 2. Stable lung nodules/masses consistent with diffuse lung metastases. Electronically Signed   By: Ivar Drape M.D.   On: 08/03/2018 16:30   US Thoracentesis Asp Pleural Space W/img Guide  Result Date: 08/02/2018 INDICATION: Patient with history of stage IV bladder cancer, right pleural effusion. Request made for diagnostic and therapeutic right thoracentesis EXAM: ULTRASOUND GUIDED DIAGNOSTIC AND THERAPEUTIC RIGHT THORACENTESIS MEDICATIONS: None COMPLICATIONS: None immediate. PROCEDURE: An ultrasound guided thoracentesis was thoroughly discussed with the patient and questions answered. The benefits, risks, alternatives and complications were also  discussed. The patient understands and wishes to proceed with the procedure. Written consent was obtained. Ultrasound was performed to localize and mark an adequate pocket of fluid in the right chest. The area was then prepped and draped in the normal sterile fashion. 1% Lidocaine was used for local anesthesia. Under ultrasound guidance a 6 Fr Safe-T-Centesis catheter was introduced. Thoracentesis was performed. The catheter was removed and a dressing applied. FINDINGS: A total of approximately 930 cc of yellow fluid was removed. Samples were sent to the laboratory as requested by the clinical team. IMPRESSION: Successful ultrasound guided diagnostic and therapeutic right thoracentesis yielding 930 cc of pleural fluid. Read by: Rowe Robert, PA-C Electronically Signed   By: Jerilynn Mages.  Shick M.D.   On: 07/30/2018 17:14    Microbiology: Recent Results (from the past 240 hour(s))  Culture, body fluid-bottle     Status: None   Collection Time: 07/30/18  4:57 PM  Result Value Ref Range Status   Specimen Description PLEURAL  Final   Special Requests FLUID  Final   Culture   Final    NO GROWTH 5 DAYS Performed at [facility], [address], [place], [place] [address]    Report Status 08/04/2018 FINAL  Final  Culture, blood (Routine X 2) w Reflex to ID Panel     Status: None   Collection Time: 07/31/18  6:38 PM  Result Value Ref Range Status   Specimen Description   Final    BLOOD LEFT ANTECUBITAL Performed at [facility] [address], [place], [place] [address]    Special Requests   Final    BOTTLES DRAWN AEROBIC ONLY Blood Culture adequate volume Performed at [facility] [address], [place], [place] [address]    Culture   Final    NO GROWTH 5 DAYS Performed at [facility], [place] [address] [place],  [address]    Report Status 08/05/2018  FINAL  Final  Culture, blood (Routine X 2) w Reflex to ID Panel     Status: None   Collection  Time: 07/31/18  6:38 PM  Result Value Ref Range Status   Specimen Description   Final    BLOOD BLOOD LEFT HAND Performed at [facility] [address] [place], [place] [address]    Special Requests   Final    BOTTLES DRAWN AEROBIC ONLY Blood Culture adequate volume Performed at [facility] [address] [place], [place] [address]    Culture   Final    NO GROWTH 5 DAYS Performed at [facility], [place] [address], [place], [place] [address]    Report Status 08/05/2018 FINAL  Final  Urine Culture     Status: Abnormal   Collection Time: 07/31/18  7:27 PM  Result Value Ref Range Status   Specimen Description   Final    URINE, CATHETERIZED Performed at [facility], [place] [address], [place], [place] [address]    Special Requests   Final    Immunocompromised Performed at [facility], [place] [address] [place],  [address]    Culture >=100,000 COLONIES/mL ENTEROBACTER AEROGENES (A)  Final   Report Status 08/03/2018 FINAL  Final   Organism ID, Bacteria ENTEROBACTER AEROGENES (A)  Final      Susceptibility   Enterobacter aerogenes - MIC*    CEFAZOLIN >=64 RESISTANT Resistant     CEFTRIAXONE <=1 SENSITIVE Sensitive     CIPROFLOXACIN <=0.25 SENSITIVE Sensitive     GENTAMICIN <=1 SENSITIVE Sensitive     IMIPENEM 2 SENSITIVE Sensitive     NITROFURANTOIN 128 RESISTANT Resistant     TRIMETH/SULFA <=20 SENSITIVE Sensitive     PIP/TAZO <=4 SENSITIVE Sensitive     * >=100,000 COLONIES/mL ENTEROBACTER AEROGENES     Labs: Basic Metabolic Panel: Recent Labs  Lab 08/02/18 0441 08/03/18 0151 08/04/18 0748 08/05/18 0810 08/06/18 0427  NA 139 139 138 137 138  K 4.2 4.7 4.6 5.8* 4.0  CL 107 107 107 106 108  CO2 21* 22 20* 20* 22  GLUCOSE 121* 111* 97 94 122*  BUN 15 22 25* 27* 24*  CREATININE 1.24 1.62* 1.70* 1.62* 1.40*  CALCIUM 8.3* 8.1* 8.1* 7.9* 7.9*   Liver Function Tests: No results for  input(s): AST, ALT, ALKPHOS, BILITOT, PROT, ALBUMIN in the last 168 hours. No results for input(s): LIPASE, AMYLASE in the last 168 hours. No results for input(s): AMMONIA in the last 168 hours. CBC: Recent Labs  Lab 08/03/18 0151 08/03/18 1548 08/04/18 0748 08/05/18 0810 08/06/18 0427 08/06/18 1341  WBC 40.5* 33.6* 28.7* 27.6* 20.4*  --   NEUTROABS  --  26.9* 22.7 20.4* 15.2*  --   HGB 8.6* 7.8* 8.5* 7.3* 6.6* 9.6*  HCT 27.1* 25.1* 27.4* 23.2* 20.9* 30.8*  MCV 85.8 85.1 86.2 84.1 85.3  --   PLT 534* 485* 534* 497* 471*  --    Cardiac Enzymes: No results for input(s): CKTOTAL, CKMB, CKMBINDEX, TROPONINI in the last 168 hours. D-Dimer No results for input(s): DDIMER in the last 72 hours. BNP: Invalid input(s): POCBNP CBG: No results for input(s): GLUCAP in the last 168 hours. Anemia work up No results for input(s): VITAMINB12, FOLATE, FERRITIN, TIBC, IRON, RETICCTPCT in the last 72 hours. Urinalysis    Component Value Date/Time   COLORURINE AMBER (A) 07/31/2018 1926   APPEARANCEUR CLEAR 07/31/2018 1926   LABSPEC 1.019 07/31/2018 1926  PHURINE 5.0 07/31/2018 1926   GLUCOSEU 50 (A) 07/31/2018 1926   HGBUR NEGATIVE 07/31/2018 1926   BILIRUBINUR NEGATIVE 07/31/2018 1926   KETONESUR NEGATIVE 07/31/2018 1926   PROTEINUR NEGATIVE 07/31/2018 1926   NITRITE NEGATIVE 07/31/2018 1926   LEUKOCYTESUR NEGATIVE 07/31/2018 1926   Sepsis Labs Invalid input(s): PROCALCITONIN,  WBC,  LACTICIDVEN    SIGNED:  Marylu Lund, MD  [facility] 08-21-2018, 4:04 PM  If 7PM-7AM, please contact night-coverage [email or web address] Password [email or web address]

## 2018-09-07 NOTE — Progress Notes (Signed)
Pt's wife, son, daughter, grandson and his wife, sister, brother-in-law and stepdaughter were bedside when I arrived. They enjoyed talking about their dad, brother,and husband. They talked about his affirmation that he was going to heaven. They talked about how even as recent as two months ago he wanted to continue to work on their cars. Talking about him seem to help them as they were entering the grief and pain of his passing. His daughter repeated several times that she could not believe this. They said they did not know about his cancer until a few months ago even though he was diagnosed two years ago. They also said their brother, pt's son, passed two years ago shortly after his diagnosis and felt he kept it from them because they were already grieving. Family was composed and appropriately tearful when I arrived. His daughter had more bouts of sobbing as she was the baby and only girl who was really active in his life. We had prayer bedside of pt and they also played his favorite praise and worship song. Family was appreciative of support. They gave me the business card for the crematory to be called for final arrangements.  I gave the card to his nurse Hinton Dyer.  Please page if additional information is needed. Evangeline, MDiv   August 28, 2018 1600  Clinical Encounter Type  Visited With Family

## 2018-09-07 NOTE — Progress Notes (Addendum)
   2018/08/29 1800  Attending [place]  Attending Physician Notified Y  Attending Physician (First and Last Name) Kalman Jewels  Will the above attending physician sign death certificate? Yes  Post Mortem Checklist  Date of Death 08-29-2018  Time of Death 90  Pronounced By Dr. Wyline Copas  Next of kin notified Yes  Name of next of kin notified of death Charley Miske  Contact Person's Relationship to Patient Son  Contact Person's Phone Number [phone]  Family Communication Notes family at bedside when patient passed away  Was the patient a No Code Blue or a Limited Code Blue? Yes  Did the patient die unattended? No  Patient restrained? Not applicable  Weight 50.9 kg  Body preparation complete Y  [place] [facility]  Notification Date 08/29/2018  Notification Time [place] [facility] Number [phone]  Is patient a potential donor? N  Autopsy  Autopsy requested by N/A  Patient Belongings/Medications Returned  Patient belongings from bedside/safe/pharmacy returned  Yes  Valuables returned to? Corinna Lines  Dead on Arrival (Emergency Department)  Patient dead on arrival? No  Notifications  Patient Placement notified that Post Mortem checklist is complete Yes  TO BE FILLED OUT BY PATIENT PLACEMENT ONLY  [place] Notified Y  Medical Examiner  Is this a medical examiner's case? [facility] home name/address/phone # [place] [phone]  Planned location of pickup Room   Body preparation complete, all IV's and drains removed. Family at bed and will take pt belongings. 41ml wasted from Dilaudid drip with Magda Kiel. Eulas Post, RN

## 2018-09-07 NOTE — Progress Notes (Signed)
Patient appears to be still uncomfortable and restless. Patient has frequently needed prn dilaudid and haldol.  I spoke with [name] (son) over the phone, and he reported being ok with starting patient on a pain drip if needed/if MD were to place orders for a drip.  On call hospitalist paged to see if we can try to get [name] more comfortable tonight. Jeremy Patrick

## 2018-09-07 NOTE — Progress Notes (Signed)
Family states that pt has become more agitated and is requesting something to help with this. Patient is waving arms and restless. Mitts placed to prevent pulling at lines. PRN med given. Eulas Post, RN

## 2018-09-07 NOTE — Progress Notes (Signed)
Palliative Care progress note  For consult: Goals of care in light of metastatic disease with continued decline  Chart reviewed including nursing notes.  Noted agitation overnight with initiation of continuous infusion of dilaudid.  Discussed case with Dr. Wyline Copas as well as bedside care team.  Saw and examined Mr. Elms today.  He is actively dying.  Multiple family is at the bedside (including son, daughter, sister, brother in law, and grandchildren. He is resting comfortably per family report.  General: Somnolent, brief periods of apnea noted. Heart: Tachycardic. No murmur appreciated. Lungs: Diminished air movement,  Abdomen: Soft, nontender, nondistendedExt: No significant edema Skin: Warm and dry  -DNR/DNI.  FULL COMFORT CARE -Resting comfortably on continuous infusion of dilaudid.  Added bolus dose for use as needed and prior to position changes. -Provided education on EOL and plan for symptom management.  We reviewed changes to regimen with goal of comfort.  All questions answered to the best of my ability. -Plan to meet again with family tomorrow AM.  Will reevaluate for possible stability to consider transfer to residential hospice at that time, but anticipate that this will likely be a hospital death.  Total time: 40 minutes Greater than 50%  of this time was spent counseling and coordinating care related to the above assessment and plan.  Micheline Rough, MD [place] Team [phone]

## 2018-09-07 DEATH — deceased

## 2018-09-08 ENCOUNTER — Other Ambulatory Visit: Payer: Medicare Other

## 2018-09-08 ENCOUNTER — Ambulatory Visit: Payer: Medicare Other

## 2018-09-09 ENCOUNTER — Ambulatory Visit: Payer: Medicare Other

## 2020-05-21 IMAGING — MR MR HEAD W/O CM
9 of 11 series · 39 of 48 positions shown · non-contrast
Comparison: None.

CLINICAL DATA: Staging of metastatic urothelial carcinoma.
Noncontrast study requested due to renal dysfunction.

EXAM:
MRI HEAD WITHOUT CONTRAST
TECHNIQUE: Multiplanar, multiecho pulse sequences of the brain and surrounding
structures were obtained without intravenous contrast.

[Series 3: DWI · axial · 3.0mm · 1.09mm/px · z∈[-91,+68]mm · 11 of 110 slices shown (1 of 4)]
[im 1/110]
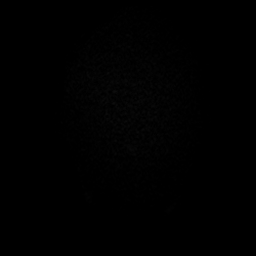
[im 11/110]
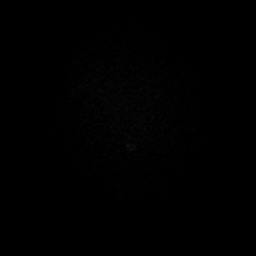
[im 22/110]
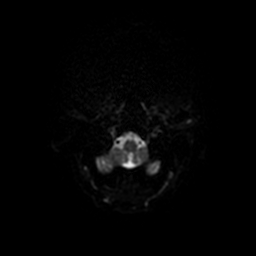
[im 33/110]
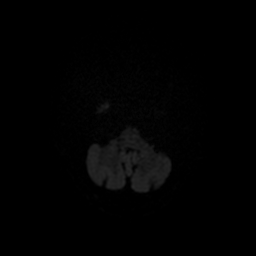
[im 44/110]
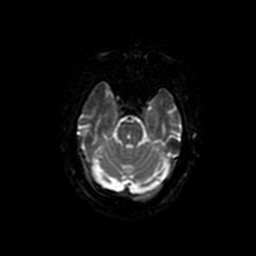
[im 55/110]
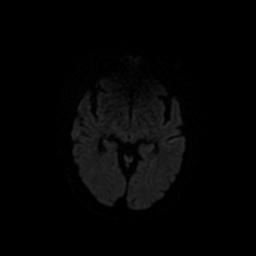
[im 66/110]
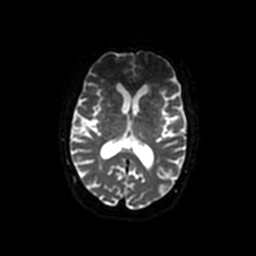
[im 77/110]
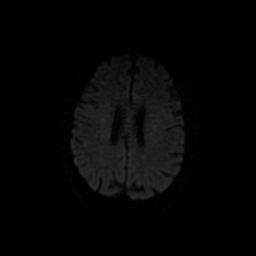
[im 88/110]
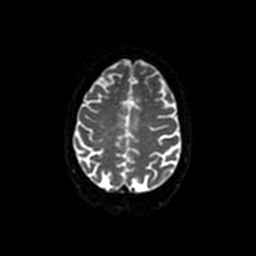
[im 99/110]
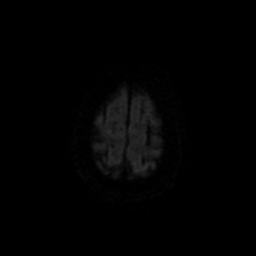
[im 110/110]
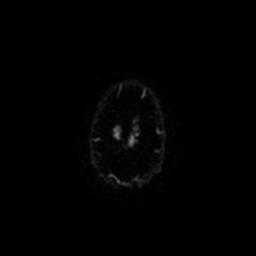

[Series 4: T1 · sagittal · 5.0mm · 0.47mm/px · 2 of 24 slices shown]
[im 1/24]
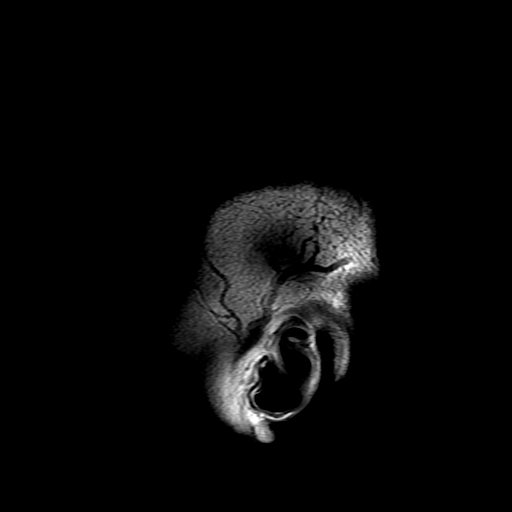
[im 24/24]
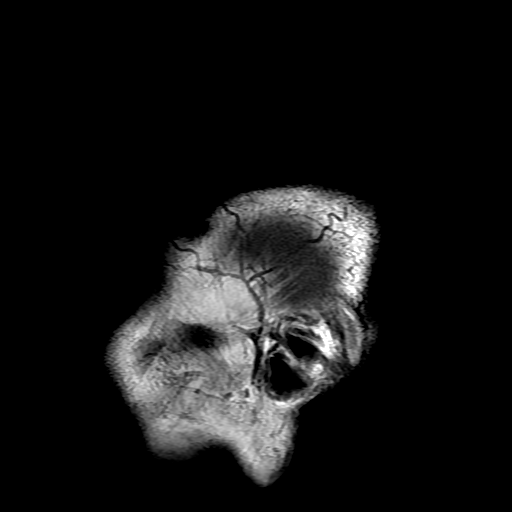

[Series 6: T2 · axial · 5.0mm · 0.43mm/px · z∈[-91,+66]mm · 2 of 24 slices shown (1 of 3)]
[im 1/24]
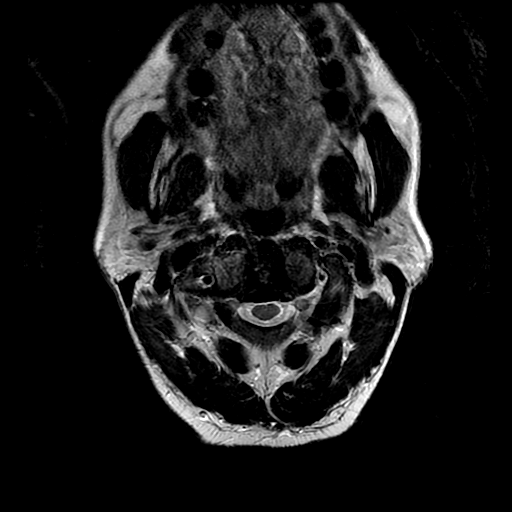
[im 24/24]
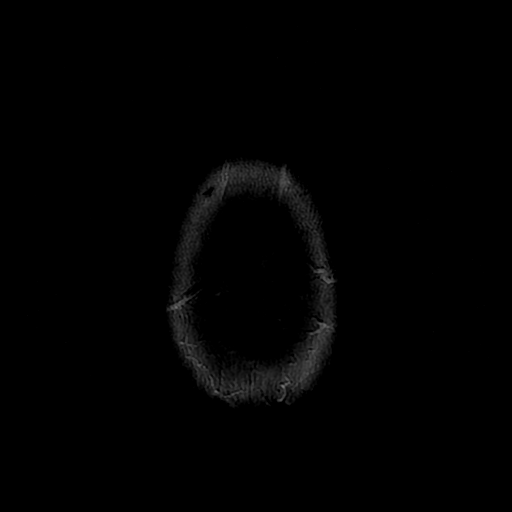

[Series 7: T2 · axial · 5.0mm · 0.43mm/px · z∈[-91,+66]mm · 2 of 28 slices shown (2 of 3)]
[im 1/28]
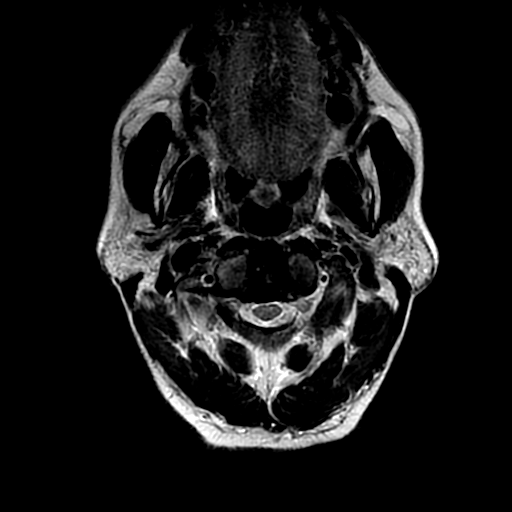
[im 28/28]
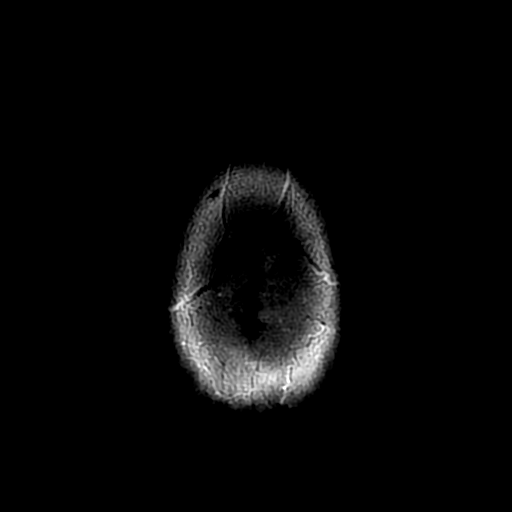

[Series 8: FLAIR · axial · 5.0mm · 0.43mm/px · z∈[-91,+66]mm · 2 of 28 slices shown]
[im 1/28]
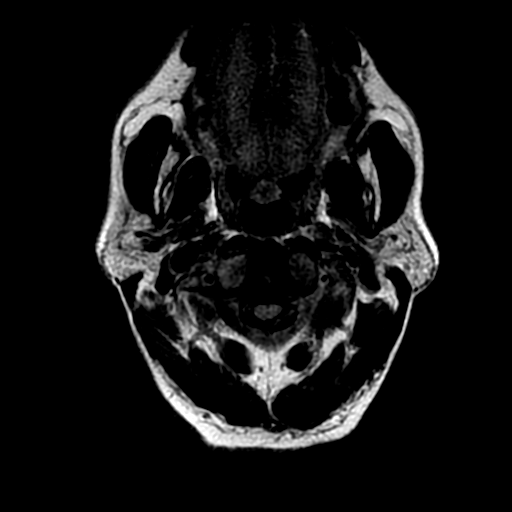
[im 28/28]
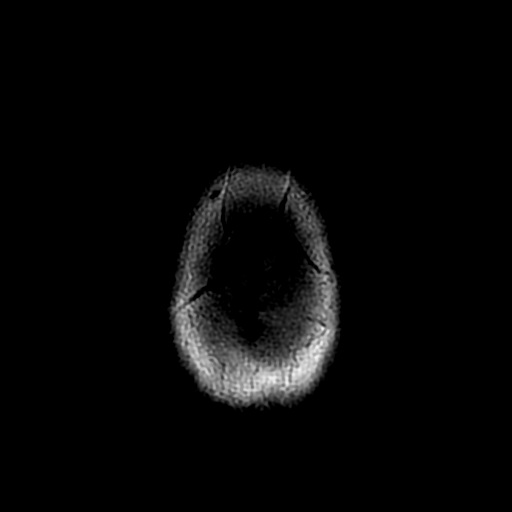

[Series 12: DWI · coronal · 3.0mm · 1.09mm/px · 8 of 110 slices shown (2 of 4)]
[im 1/110]
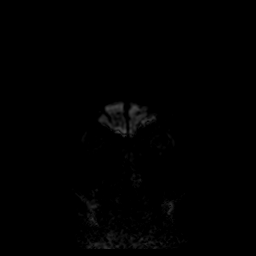
[im 13/110]
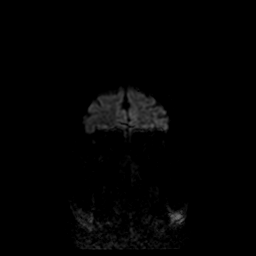
[im 37/110]
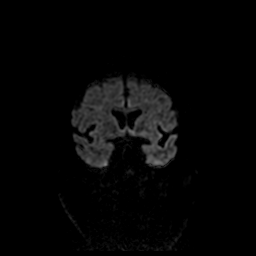
[im 49/110]
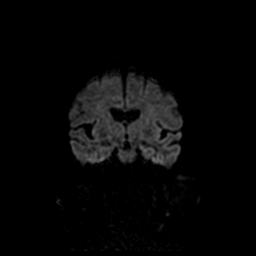
[im 61/110]
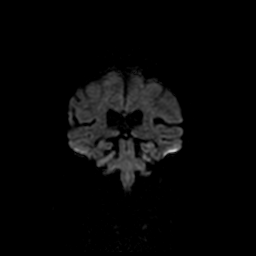
[im 73/110]
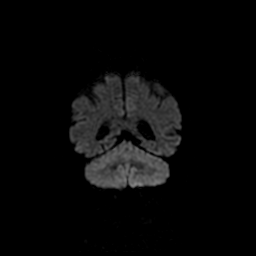
[im 97/110]
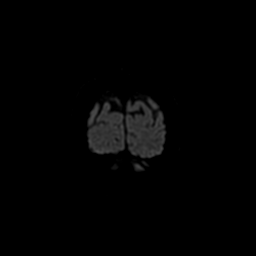
[im 110/110]
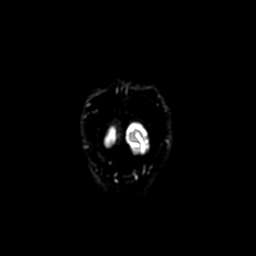

[Series 13: T2 · coronal · 5.0mm · 0.47mm/px · 2 of 24 slices shown (3 of 3)]
[im 1/24]
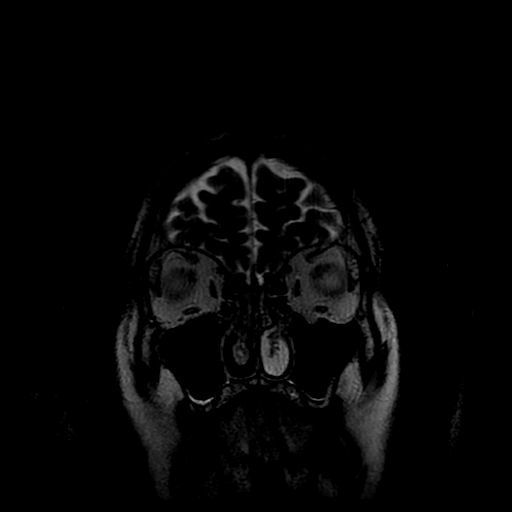
[im 24/24]
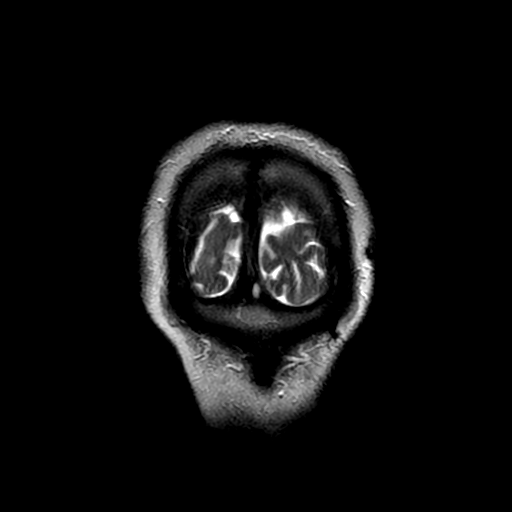

[Series 300: DWI · axial · 3.0mm · 1.09mm/px · z∈[-91,+68]mm · 5 of 55 slices shown (3 of 4)]
[im 1/55]
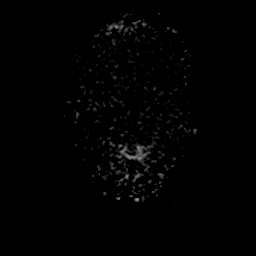
[im 14/55]
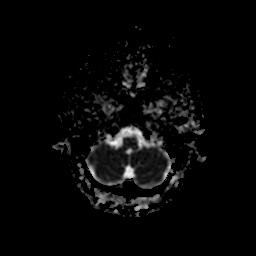
[im 28/55]
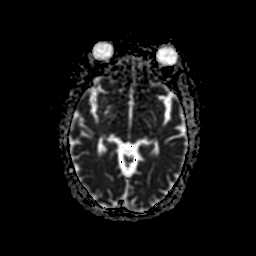
[im 41/55]
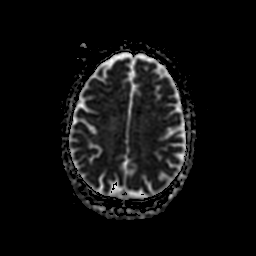
[im 55/55]
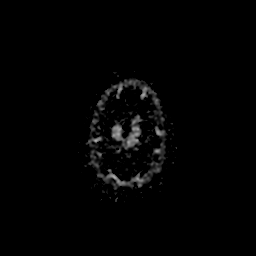

[Series 1200: DWI · coronal · 3.0mm · 1.09mm/px · 5 of 55 slices shown (4 of 4)]
[im 1/55]
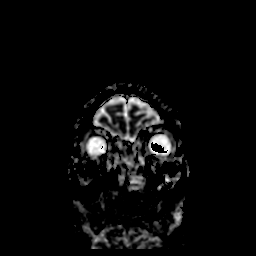
[im 14/55]
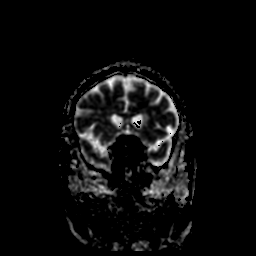
[im 28/55]
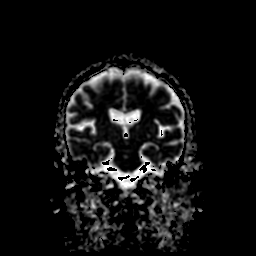
[im 41/55]
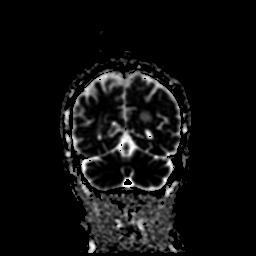
[im 55/55]
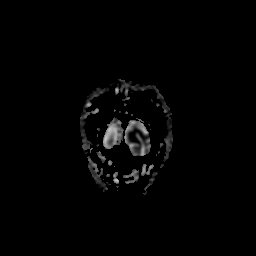

[39 of 48 positions shown; findings below may reference images not displayed]

FINDINGS: Brain: There is no evidence of acute infarct, intracranial
hemorrhage, intracranial mass effect, or extra-axial fluid
collection. Mild generalized cerebral atrophy is not greater than
expected for age. Patchy subcortical and deep cerebral white matter
T2 hyperintensities are nonspecific but compatible with
mild-to-moderate chronic small vessel ischemic disease. Chronic
lacunar infarcts are noted in the right paramedian pons and right
putamen. No mass is identified, however sensitivity for detection of
very small lesions is reduced by the lack of IV contrast. No
vasogenic edema type signal is present.

Vascular: Major intracranial vascular flow voids are preserved.

Skull and upper cervical spine: Unremarkable bone marrow signal.

Sinuses/Orbits: Unremarkable orbits. Paranasal sinuses and mastoid
air cells are clear.

Other: None.
IMPRESSION: 1. No evidence of intracranial metastatic disease on this unenhanced
examination.
2. Mild-to-moderate chronic small vessel ischemic disease.

## 2020-05-23 IMAGING — US IR FLUORO GUIDE CV LINE*L*
1 series · 1 of 1 positions shown · non-contrast
Comparison: none

CLINICAL DATA: Metastatic urothelial carcinoma

[Series 1: ir fluoro/shunt/fist · 1 of 1 slices shown]
[im 1/1]
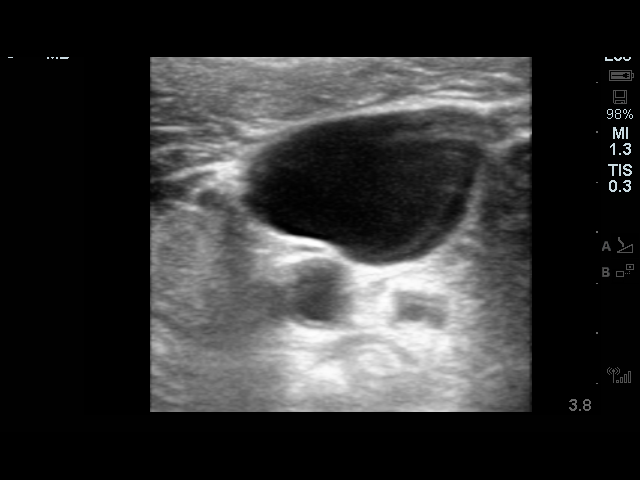

[1 of 1 positions shown; findings below may reference images not displayed]

EXAM:
RIGHT INTERNAL JUGULAR SINGLE LUMEN POWER PORT CATHETER INSERTION

Date:  05/21/2018 05/21/2018 [DATE]

Radiologist:  Gonzalez Gonzalez, Maqsood

Guidance:  Ultrasound and fluoroscopic

MEDICATIONS:
Ancef 2 g; The antibiotic was administered within an appropriate
time interval prior to skin puncture.

ANESTHESIA/SEDATION:
Versed 2.0 mg IV; Fentanyl 100 mcg IV;

Moderate Sedation Time:  36 minutes

The patient was continuously monitored during the procedure by the
interventional radiology nurse under my direct supervision.

FLUOROSCOPY TIME:  One minutes, 36 seconds (19 mGy)

COMPLICATIONS:
None immediate.

CONTRAST:  None.

PROCEDURE:
Informed consent was obtained from the patient following explanation
of the procedure, risks, benefits and alternatives. The patient
understands, agrees and consents for the procedure. All questions
were addressed. A time out was performed.

Maximal barrier sterile technique utilized including caps, mask,
sterile gowns, sterile gloves, large sterile drape, hand hygiene,
and 2% chlorhexidine scrub.

Under sterile conditions and local anesthesia, right internal
jugular micropuncture venous access was performed. Access was
performed with ultrasound. Images were obtained for documentation of
the patent right internal jugular vein. A guide wire was inserted
followed by a transitional dilator. This allowed insertion of a
guide wire and catheter into the IVC. Measurements were obtained
from the SVC / RA junction back to the right IJ venotomy site. In
the right infraclavicular chest, a subcutaneous pocket was created
over the second anterior rib. This was done under sterile conditions
and local anesthesia. 1% lidocaine with epinephrine was utilized for
this. A 2.5 cm incision was made in the skin. Blunt dissection was
performed to create a subcutaneous pocket over the right pectoralis
major muscle. The pocket was flushed with saline vigorously. There
was adequate hemostasis. The port catheter was assembled and checked
for leakage. The port catheter was secured in the pocket with two
retention sutures. The tubing was tunneled subcutaneously to the
right venotomy site and inserted into the SVC/RA junction through a
valved peel-away sheath. Position was confirmed with fluoroscopy.
Images were obtained for documentation. The patient tolerated the
procedure well. No immediate complications. Incisions were closed in
a two layer fashion with 4 - 0 Vicryl suture. Dermabond was applied
to the skin. The port catheter was accessed, blood was aspirated
followed by saline and heparin flushes. Needle was removed. A dry
sterile dressing was applied.
IMPRESSION: Ultrasound and fluoroscopically guided right internal jugular single
lumen power port catheter insertion. Tip in the SVC/RA junction.
Catheter ready for use.
